# Patient Record
Sex: Female | Born: 1965 | Race: Black or African American | Hispanic: No | Marital: Single | Smoking: Never smoker
Health system: Southern US, Community
[De-identification: ages and names within clinical notes are randomized; demographics above are authoritative.]

## PROBLEM LIST (undated history)

## (undated) DIAGNOSIS — I10 Essential (primary) hypertension: Secondary | ICD-10-CM

## (undated) DIAGNOSIS — E119 Type 2 diabetes mellitus without complications: Secondary | ICD-10-CM

## (undated) DIAGNOSIS — Z973 Presence of spectacles and contact lenses: Secondary | ICD-10-CM

## (undated) DIAGNOSIS — I509 Heart failure, unspecified: Secondary | ICD-10-CM

## (undated) DIAGNOSIS — G7249 Other inflammatory and immune myopathies, not elsewhere classified: Secondary | ICD-10-CM

## (undated) DIAGNOSIS — I272 Pulmonary hypertension, unspecified: Secondary | ICD-10-CM

## (undated) HISTORY — DX: Essential (primary) hypertension: I10

## (undated) HISTORY — DX: Type 2 diabetes mellitus without complications: E11.9

## (undated) HISTORY — DX: Other inflammatory and immune myopathies, not elsewhere classified: G72.49

---

## 1997-04-26 HISTORY — PX: SKIN GRAFT: SHX250

## 1997-10-12 ENCOUNTER — Emergency Department (HOSPITAL_COMMUNITY): Admission: EM | Admit: 1997-10-12 | Discharge: 1997-10-12 | Payer: Self-pay | Admitting: Emergency Medicine

## 1997-10-13 ENCOUNTER — Emergency Department (HOSPITAL_COMMUNITY): Admission: EM | Admit: 1997-10-13 | Discharge: 1997-10-13 | Payer: Self-pay | Admitting: Emergency Medicine

## 1997-10-28 ENCOUNTER — Ambulatory Visit (HOSPITAL_BASED_OUTPATIENT_CLINIC_OR_DEPARTMENT_OTHER): Admission: RE | Admit: 1997-10-28 | Discharge: 1997-10-28 | Payer: Self-pay | Admitting: Plastic Surgery

## 1999-05-28 ENCOUNTER — Encounter: Payer: Self-pay | Admitting: Internal Medicine

## 1999-05-28 ENCOUNTER — Encounter: Admission: RE | Admit: 1999-05-28 | Discharge: 1999-05-28 | Payer: Self-pay | Admitting: Internal Medicine

## 1999-06-11 ENCOUNTER — Other Ambulatory Visit: Admission: RE | Admit: 1999-06-11 | Discharge: 1999-06-11 | Payer: Self-pay | Admitting: Internal Medicine

## 1999-07-16 ENCOUNTER — Encounter: Admission: RE | Admit: 1999-07-16 | Discharge: 1999-10-14 | Payer: Self-pay | Admitting: Internal Medicine

## 2000-02-23 ENCOUNTER — Encounter: Admission: RE | Admit: 2000-02-23 | Discharge: 2000-05-23 | Payer: Self-pay | Admitting: Internal Medicine

## 2000-09-30 ENCOUNTER — Other Ambulatory Visit: Admission: RE | Admit: 2000-09-30 | Discharge: 2000-09-30 | Payer: Self-pay | Admitting: Internal Medicine

## 2000-12-14 ENCOUNTER — Encounter: Admission: RE | Admit: 2000-12-14 | Discharge: 2001-03-14 | Payer: Self-pay | Admitting: Internal Medicine

## 2001-10-09 ENCOUNTER — Other Ambulatory Visit: Admission: RE | Admit: 2001-10-09 | Discharge: 2001-10-09 | Payer: Self-pay | Admitting: Internal Medicine

## 2001-12-31 ENCOUNTER — Emergency Department (HOSPITAL_COMMUNITY): Admission: EM | Admit: 2001-12-31 | Discharge: 2001-12-31 | Payer: Self-pay | Admitting: Emergency Medicine

## 2004-07-08 ENCOUNTER — Emergency Department (HOSPITAL_COMMUNITY): Admission: EM | Admit: 2004-07-08 | Discharge: 2004-07-08 | Payer: Self-pay | Admitting: Emergency Medicine

## 2005-06-28 ENCOUNTER — Encounter: Admission: RE | Admit: 2005-06-28 | Discharge: 2005-06-28 | Payer: Self-pay | Admitting: Internal Medicine

## 2005-07-23 ENCOUNTER — Encounter: Admission: RE | Admit: 2005-07-23 | Discharge: 2005-07-23 | Payer: Self-pay | Admitting: Gastroenterology

## 2006-10-05 ENCOUNTER — Emergency Department (HOSPITAL_COMMUNITY): Admission: EM | Admit: 2006-10-05 | Discharge: 2006-10-05 | Payer: Self-pay | Admitting: Emergency Medicine

## 2010-04-08 ENCOUNTER — Emergency Department (HOSPITAL_COMMUNITY)
Admission: EM | Admit: 2010-04-08 | Discharge: 2010-04-08 | Payer: Self-pay | Source: Home / Self Care | Admitting: Emergency Medicine

## 2010-04-26 HISTORY — PX: MUSCLE BIOPSY: SHX716

## 2010-05-12 ENCOUNTER — Ambulatory Visit (HOSPITAL_COMMUNITY)
Admission: RE | Admit: 2010-05-12 | Discharge: 2010-05-12 | Payer: Self-pay | Source: Home / Self Care | Attending: Neurosurgery | Admitting: Neurosurgery

## 2010-05-13 LAB — BASIC METABOLIC PANEL
BUN: 12 mg/dL (ref 6–23)
CO2: 33 mEq/L — ABNORMAL HIGH (ref 19–32)
Calcium: 9.2 mg/dL (ref 8.4–10.5)
Chloride: 98 mEq/L (ref 96–112)
Creatinine, Ser: 0.58 mg/dL (ref 0.4–1.2)
GFR calc Af Amer: >60 mL/min (ref >60)
GFR calc non Af Amer: >60 mL/min (ref >60)
Glucose, Bld: 87 mg/dL (ref 70–99)
Potassium: 3.4 mEq/L — ABNORMAL LOW (ref 3.5–5.1)
Sodium: 139 mEq/L (ref 135–145)

## 2010-05-13 LAB — SURGICAL PCR SCREEN
MRSA, PCR: NEGATIVE
Staphylococcus aureus: NEGATIVE

## 2010-05-13 LAB — CBC
HCT: 33.6 % — ABNORMAL LOW (ref 36.0–46.0)
Hemoglobin: 11.4 g/dL — ABNORMAL LOW (ref 12.0–15.0)
MCH: 27.2 pg (ref 26.0–34.0)
MCHC: 33.9 g/dL (ref 30.0–36.0)
MCV: 80.2 fL (ref 78.0–100.0)
Platelets: 432 10*3/uL — ABNORMAL HIGH (ref 150–400)
RBC: 4.19 MIL/uL (ref 3.87–5.11)
RDW: 14.6 % (ref 11.5–15.5)
WBC: 5.2 10*3/uL (ref 4.0–10.5)

## 2010-05-13 LAB — GLUCOSE, CAPILLARY: Glucose-Capillary: 78 mg/dL (ref 70–99)

## 2010-05-18 NOTE — Consult Note (Signed)
  Shannon [name], Shannon [name]              ACCOUNT NO.:  [id number]  MEDICAL RECORD NO.:  [id number]          PATIENT TYPE:  AMB  LOCATION:  SDS                          FACILITY:  [facility]  PHYSICIAN:  Leeroy Cha, M.D.   DATE OF BIRTH:  [birth year]  DATE OF CONSULTATION: DATE OF DISCHARGE:                                CONSULTATION   Shannon [name] is a lady who was seen in my office last week.  She has diffuse weakness.  She was sent to [place] for a biopsy of the left biceps. The patient was advised to have surgery today.  Nevertheless, during surgery had a difficult time trying to get hold of her and today she showed up.  She was late and we have no preop.  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  She is going to be cancelled and to schedule some time at her convenience.          ______________________________ Leeroy Cha, M.D.     EB/MEDQ  D:  05/12/2010  T:  05/12/2010  Job:  [id number]  Electronically Signed by Leeroy Cha M.D. on 05/18/2010 05:22:16 PM

## 2010-05-19 ENCOUNTER — Ambulatory Visit (HOSPITAL_COMMUNITY)
Admission: RE | Admit: 2010-05-19 | Discharge: 2010-05-19 | Payer: Self-pay | Source: Home / Self Care | Attending: Neurosurgery | Admitting: Neurosurgery

## 2010-05-20 LAB — GLUCOSE, CAPILLARY
Glucose-Capillary: 60 mg/dL — ABNORMAL LOW (ref 70–99)
Glucose-Capillary: 103 mg/dL — ABNORMAL HIGH (ref 70–99)
Glucose-Capillary: 67 mg/dL — ABNORMAL LOW (ref 70–99)

## 2010-05-20 LAB — POCT I-STAT 4, (NA,K, GLUC, HGB,HCT)
Glucose, Bld: 78 mg/dL (ref 70–99)
HCT: 34 % — ABNORMAL LOW (ref 36.0–46.0)
Hemoglobin: 11.6 g/dL — ABNORMAL LOW (ref 12.0–15.0)
Potassium: 3 mEq/L — ABNORMAL LOW (ref 3.5–5.1)
Sodium: 140 mEq/L (ref 135–145)

## 2010-06-03 NOTE — Op Note (Signed)
  Shannon [name], Shannon [name]              ACCOUNT NO.:  [id number]  MEDICAL RECORD NO.:  [id number]          PATIENT TYPE:  AMB  LOCATION:  SDS                          FACILITY:  [facility]  PHYSICIAN:  Leeroy Cha, M.D.   DATE OF BIRTH:  1966/03/03  DATE OF PROCEDURE:  05/19/2010 DATE OF DISCHARGE:  05/19/2010                              OPERATIVE REPORT   ADMISSION DIAGNOSES:  Diffuse weakness.  Myopathy.  POSTOPERATIVE DIAGNOSES:  Diffuse weakness.  Myopathy.  PROCEDURE:  Biopsy of the left biceps.  CLINICAL HISTORY:  [name] is a patient who was sent to [place] for a biopsy of the left biceps.  She had been seen by [facility]. She has had difficulty walking, difficulty standing, all these started several months ago.  She had completely worked up.  Diagnosis has been made.  Biopsy was requested, will be sent to [facility] in [place], [place].  The patient knows the risks of infection, hematoma, and need for further surgery .  PROCEDURE IN DETAILS:  [name] was taken to the OR and after intubation, the left arm was prepped with Betadine.  Drapes were applied.  Longitudinal incision was made through the skin to thick adipose tissue.  We found the biceps.  The fascia was opened and 2 pieces of biopsies were approximately 2 cm in length were removed.  Then immediately was wrapped in saline solution and the patient was sent immediately to the laboratory.  They were made aware by [place] that the biopsies need to be sent to [facility].  Then the area was irrigated. Hemostasis was done with bipolar.  The fascia was closed with 2-0 Vicryl, subcutaneous space with 3-0, and the skin with Steri-Strips. The patient is going to go to the recovery room and discharged to home.          ______________________________ Leeroy Cha, M.D.     EB/MEDQ  D:  05/19/2010  T:  05/20/2010  Job:  [id number]  Electronically Signed by Leeroy Cha M.D. on 06/03/2010 09:36:13 AM

## 2010-07-07 LAB — BASIC METABOLIC PANEL
BUN: 15 mg/dL (ref 6–23)
CO2: 30 mEq/L (ref 19–32)
Calcium: 8.9 mg/dL (ref 8.4–10.5)
Chloride: 96 mEq/L (ref 96–112)
Creatinine, Ser: 0.63 mg/dL (ref 0.4–1.2)
GFR calc Af Amer: >60 mL/min (ref >60)
GFR calc non Af Amer: >60 mL/min (ref >60)
Glucose, Bld: 70 mg/dL (ref 70–99)
Potassium: 2.8 mEq/L — ABNORMAL LOW (ref 3.5–5.1)
Sodium: 137 mEq/L (ref 135–145)

## 2010-07-07 LAB — CK TOTAL AND CKMB (NOT AT ARMC)
CK, MB: 429.4 ng/mL (ref 0.3–4.0)
Relative Index: 13 — ABNORMAL HIGH (ref 0.0–2.5)
Total CK: 3293 U/L — ABNORMAL HIGH (ref 7–177)

## 2010-07-07 LAB — CBC
HCT: 30.9 % — ABNORMAL LOW (ref 36.0–46.0)
Hemoglobin: 10.7 g/dL — ABNORMAL LOW (ref 12.0–15.0)
MCH: 27.7 pg (ref 26.0–34.0)
MCHC: 34.6 g/dL (ref 30.0–36.0)
MCV: 80.1 fL (ref 78.0–100.0)
Platelets: 456 10*3/uL — ABNORMAL HIGH (ref 150–400)
RBC: 3.86 MIL/uL — ABNORMAL LOW (ref 3.87–5.11)
RDW: 14.7 % (ref 11.5–15.5)
WBC: 7.3 10*3/uL (ref 4.0–10.5)

## 2010-07-07 LAB — URINALYSIS, ROUTINE W REFLEX MICROSCOPIC
Glucose, UA: NEGATIVE mg/dL
Ketones, ur: 15 mg/dL — AB
Nitrite: NEGATIVE
Protein, ur: 100 mg/dL — AB
Specific Gravity, Urine: 1.024 (ref 1.005–1.030)
Urobilinogen, UA: 1 mg/dL (ref 0.0–1.0)
pH: 5.5 (ref 5.0–8.0)

## 2010-07-07 LAB — POCT PREGNANCY, URINE: Preg Test, Ur: NEGATIVE

## 2010-07-07 LAB — DIFFERENTIAL
Basophils Absolute: 0 10*3/uL (ref 0.0–0.1)
Basophils Relative: 0 % (ref 0–1)
Eosinophils Absolute: 0.1 10*3/uL (ref 0.0–0.7)
Eosinophils Relative: 1 % (ref 0–5)
Lymphocytes Relative: 42 % (ref 12–46)
Lymphs Abs: 3.1 10*3/uL (ref 0.7–4.0)
Monocytes Absolute: 0.7 10*3/uL (ref 0.1–1.0)
Monocytes Relative: 10 % (ref 3–12)
Neutro Abs: 3.4 10*3/uL (ref 1.7–7.7)
Neutrophils Relative %: 47 % (ref 43–77)

## 2010-07-07 LAB — URINE MICROSCOPIC-ADD ON

## 2010-07-17 ENCOUNTER — Ambulatory Visit: Payer: 59 | Attending: Diagnostic Neuroimaging | Admitting: Physical Therapy

## 2010-07-17 DIAGNOSIS — R269 Unspecified abnormalities of gait and mobility: Secondary | ICD-10-CM | POA: Insufficient documentation

## 2010-07-17 DIAGNOSIS — IMO0001 Reserved for inherently not codable concepts without codable children: Secondary | ICD-10-CM | POA: Insufficient documentation

## 2010-07-20 ENCOUNTER — Ambulatory Visit: Payer: 59 | Admitting: Physical Therapy

## 2010-07-23 ENCOUNTER — Ambulatory Visit: Payer: 59 | Attending: Diagnostic Neuroimaging | Admitting: Physical Therapy

## 2010-07-23 DIAGNOSIS — R269 Unspecified abnormalities of gait and mobility: Secondary | ICD-10-CM | POA: Insufficient documentation

## 2010-07-23 DIAGNOSIS — IMO0001 Reserved for inherently not codable concepts without codable children: Secondary | ICD-10-CM | POA: Insufficient documentation

## 2010-07-27 ENCOUNTER — Ambulatory Visit: Payer: 59 | Attending: Diagnostic Neuroimaging | Admitting: Physical Therapy

## 2010-07-27 DIAGNOSIS — R269 Unspecified abnormalities of gait and mobility: Secondary | ICD-10-CM | POA: Insufficient documentation

## 2010-07-27 DIAGNOSIS — IMO0001 Reserved for inherently not codable concepts without codable children: Secondary | ICD-10-CM | POA: Insufficient documentation

## 2010-07-30 ENCOUNTER — Ambulatory Visit: Payer: 59

## 2010-07-30 ENCOUNTER — Ambulatory Visit: Payer: 59 | Admitting: Physical Therapy

## 2010-08-04 ENCOUNTER — Ambulatory Visit: Payer: 59 | Admitting: Physical Therapy

## 2010-08-06 ENCOUNTER — Ambulatory Visit: Payer: 59 | Admitting: Physical Therapy

## 2010-08-10 ENCOUNTER — Ambulatory Visit: Payer: 59 | Admitting: Physical Therapy

## 2010-08-13 ENCOUNTER — Ambulatory Visit: Payer: 59 | Admitting: Physical Therapy

## 2010-08-26 ENCOUNTER — Ambulatory Visit: Payer: 59 | Admitting: Physical Therapy

## 2010-08-27 ENCOUNTER — Ambulatory Visit: Payer: 59 | Admitting: Physical Therapy

## 2010-09-01 ENCOUNTER — Ambulatory Visit: Payer: 59 | Admitting: Physical Therapy

## 2010-09-03 ENCOUNTER — Ambulatory Visit: Payer: 59 | Admitting: Physical Therapy

## 2010-09-08 ENCOUNTER — Ambulatory Visit: Payer: 59 | Admitting: Physical Therapy

## 2010-09-10 ENCOUNTER — Ambulatory Visit: Payer: 59 | Admitting: Physical Therapy

## 2010-09-13 ENCOUNTER — Emergency Department (HOSPITAL_COMMUNITY)
Admission: EM | Admit: 2010-09-13 | Discharge: 2010-09-13 | Disposition: A | Discharge status: Home / Self Care | Payer: 59 | Attending: Emergency Medicine | Admitting: Emergency Medicine

## 2010-09-13 ENCOUNTER — Emergency Department (HOSPITAL_COMMUNITY): Payer: 59

## 2010-09-13 ENCOUNTER — Inpatient Hospital Stay (INDEPENDENT_AMBULATORY_CARE_PROVIDER_SITE_OTHER)
Admission: RE | Admit: 2010-09-13 | Discharge: 2010-09-13 | Disposition: A | Discharge status: Home / Self Care | Payer: 59 | Source: Ambulatory Visit | Attending: Family Medicine | Admitting: Family Medicine

## 2010-09-13 DIAGNOSIS — Z794 Long term (current) use of insulin: Secondary | ICD-10-CM | POA: Insufficient documentation

## 2010-09-13 DIAGNOSIS — E119 Type 2 diabetes mellitus without complications: Secondary | ICD-10-CM | POA: Insufficient documentation

## 2010-09-13 DIAGNOSIS — D649 Anemia, unspecified: Secondary | ICD-10-CM

## 2010-09-13 DIAGNOSIS — Z79899 Other long term (current) drug therapy: Secondary | ICD-10-CM | POA: Insufficient documentation

## 2010-09-13 DIAGNOSIS — M79609 Pain in unspecified limb: Secondary | ICD-10-CM | POA: Insufficient documentation

## 2010-09-13 DIAGNOSIS — L03019 Cellulitis of unspecified finger: Secondary | ICD-10-CM | POA: Insufficient documentation

## 2010-09-13 DIAGNOSIS — M7989 Other specified soft tissue disorders: Secondary | ICD-10-CM | POA: Insufficient documentation

## 2010-09-13 DIAGNOSIS — R609 Edema, unspecified: Secondary | ICD-10-CM

## 2010-09-13 DIAGNOSIS — I10 Essential (primary) hypertension: Secondary | ICD-10-CM | POA: Insufficient documentation

## 2010-09-13 LAB — POCT I-STAT, CHEM 8
BUN: 26 mg/dL — ABNORMAL HIGH (ref 6–23)
Calcium, Ion: 1.13 mmol/L (ref 1.12–1.32)
Chloride: 97 mEq/L (ref 96–112)
Creatinine, Ser: 0.7 mg/dL (ref 0.4–1.2)
Glucose, Bld: 228 mg/dL — ABNORMAL HIGH (ref 70–99)
HCT: 30 % — ABNORMAL LOW (ref 36.0–46.0)
Hemoglobin: 10.2 g/dL — ABNORMAL LOW (ref 12.0–15.0)
Potassium: 3.9 mEq/L (ref 3.5–5.1)
Sodium: 136 mEq/L (ref 135–145)
TCO2: 33 mmol/L (ref 0–100)

## 2010-09-13 LAB — POCT URINALYSIS DIP (DEVICE)
Bilirubin Urine: NEGATIVE
Glucose, UA: 500 mg/dL — AB
Hgb urine dipstick: NEGATIVE
Ketones, ur: NEGATIVE mg/dL
Nitrite: NEGATIVE
Protein, ur: NEGATIVE mg/dL
Specific Gravity, Urine: 1.015 (ref 1.005–1.030)
Urobilinogen, UA: 0.2 mg/dL (ref 0.0–1.0)
pH: 6.5 (ref 5.0–8.0)

## 2010-09-15 ENCOUNTER — Ambulatory Visit: Payer: 59 | Admitting: Physical Therapy

## 2010-09-17 ENCOUNTER — Ambulatory Visit: Payer: 59 | Admitting: Physical Therapy

## 2010-12-24 ENCOUNTER — Inpatient Hospital Stay (INDEPENDENT_AMBULATORY_CARE_PROVIDER_SITE_OTHER)
Admission: RE | Admit: 2010-12-24 | Discharge: 2010-12-24 | Disposition: A | Discharge status: Home / Self Care | Payer: 59 | Source: Ambulatory Visit | Attending: Emergency Medicine | Admitting: Emergency Medicine

## 2010-12-24 DIAGNOSIS — L0231 Cutaneous abscess of buttock: Secondary | ICD-10-CM

## 2010-12-26 ENCOUNTER — Inpatient Hospital Stay (HOSPITAL_COMMUNITY)
Admission: RE | Admit: 2010-12-26 | Discharge: 2010-12-26 | Disposition: A | Discharge status: Home / Self Care | Payer: 59 | Source: Ambulatory Visit | Attending: Emergency Medicine | Admitting: Emergency Medicine

## 2010-12-27 LAB — CULTURE, ROUTINE-ABSCESS

## 2011-03-24 ENCOUNTER — Other Ambulatory Visit: Payer: Self-pay | Admitting: Dermatology

## 2013-01-03 ENCOUNTER — Encounter: Payer: Self-pay | Admitting: Diagnostic Neuroimaging

## 2013-01-03 ENCOUNTER — Ambulatory Visit (INDEPENDENT_AMBULATORY_CARE_PROVIDER_SITE_OTHER): Payer: 59 | Admitting: Diagnostic Neuroimaging

## 2013-01-03 VITALS — BP 173/86 | HR 81 | Temp 98.8°F | Ht 60.0 in | Wt 200.0 lb

## 2013-01-03 DIAGNOSIS — G7249 Other inflammatory and immune myopathies, not elsewhere classified: Secondary | ICD-10-CM | POA: Insufficient documentation

## 2013-01-03 NOTE — Progress Notes (Signed)
[facility]  PATIENT: Shannon [name] DOB: [birth year]  REFERRING CLINICIAN:  HISTORY FROM: patient REASON FOR VISIT: follow up   HISTORICAL  CHIEF COMPLAINT:  No chief complaint on file.   HISTORY OF PRESENT ILLNESS:   UPDATE 01/03/13: Since last visit, stable. Reports some left arm and left leg weakness that she feels is stable. I did not note significant left leg weakness at last visit.  UPDATE 06/20/12: Since last visit, sxs are stable. Left arm weakness stable. Mild fatigue. Overall, no worsening since last visit. No falls. Doing well overall.  UPDATE 11/22/11: Doing well. Finally tapered off prednisone, has been off x 6 weeks. LUE weakness is stable. No swallow diff. No leg weakness.  UPDATE 07/07/11: Currently on prednisone 20m daily. 6 weeks ago, when reducing from 30 to 241mper day, started to have mild increased weakness of arms (L > R). No SOB, chest pain or leg weakness. No swallowing diff.  UPDATE 03/03/11: Strength is stable to improving since last visit.  Tolerating prednisone.  Still having weight gain and moon facies.  UPDATE 10/16/10:  The patient states, that her strength has increased further, almost back to her normal baseline. She has been released from PT, and is doing exercises at home. 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 She is still on prednisone 8022m day, and reports, that she sometimes has insomnia, and that she has gained 11 lbs, since the last visit in March 2012.  Now is on insulin for her DM and her BS is usually in the hundreds, compared to the 300s, like it was before.  UPDATE 07/20/10: Shannon [name], that she feels a lot better. She reports, that she has more energy, better strenght and much less difficulty swallowing. She also reports, that her speaking is almost normal again.She is on the 16m62m prednisone a day, and she states, that she  had developed a thrush, for which she is taking Nystatin. She denies any weight gain, being aggressive, insomnia or difficulties in controlling her DM or HTN.  UPDATE 06/08/10: Biopsy confirmed inflamm myopathy; started on prednisone and speech/swalllowing ahve improved a little.  Strength in arms/legs about the same.  Has fallen x 2.  PRIOR HPI (03/18/10): 44 y64r old right-handed female, with hypertension, diabetes, presenting for evaluation of progressive weakness, dysphasia and dysarthria. She is here with her daughter.  The patient reports 2 month history of progressive weakness in her arms and legs. Over the past 2 weeks she has had a change in her speech as well as difficulty with swallowing solid foods. Patient had a similar event of swallowing trouble 4 years ago which spontaneously resolved over a four-month period.  Patient denies any breathing difficulty. She feels that her weakness is slightly improved over the past few weeks but her speech and swallowing have worsened. She denies double vision. She does have intermittent blurred vision lasting for a few seconds.  REVIEW OF SYSTEMS: Full 14 system review of systems performed and notable only for weakness only.  ALLERGIES: No Known Allergies  HOME MEDICATIONS: Prior to Admission medications   Medication Sig Start Date End Date Taking? Authorizing Provider  metFORMIN (GLUCOPHAGE) 1000 MG tablet  10/17/12  Yes Historical Provider, MD  valsartan-hydrochlorothiazide (DIOVAN-HCT) 160-25 MG per tablet  11/16/12  Yes Historical Provider, MD   No outpatient prescriptions prior to visit.   No facility-administered medications prior to visit.  PAST MEDICAL HISTORY: Past Medical History  Diagnosis Date  . Hypertension   . Diabetes   . Inflammatory myopathy     PAST SURGICAL HISTORY: Past Surgical History  Procedure Laterality Date  . Skin graft  1999    FAMILY HISTORY: Family History  Problem Relation Age of Onset  . Heart  disease    . Hypertension    . Diabetes    . Hypertension Mother   . Diabetes Mother   . Hypertension Father     SOCIAL HISTORY:  History   Social History  . Marital Status: Single    Spouse Name: N/A    Number of Children: 1  . Years of Education: College   Occupational History  .      [employer]   Social History Main Topics  . Smoking status: Never Smoker   . Smokeless tobacco: Never Used  . Alcohol Use: No  . Drug Use: No  . Sexual Activity: Not on file   Other Topics Concern  . Not on file   Social History Narrative   Patient lives at home with her daughter.   Caffeine Use:      PHYSICAL EXAM  Filed Vitals:   01/03/13 1413  BP: 173/86  Pulse: 81  Temp: 98.8 F (37.1 C)  TempSrc: Oral  Height: 5' (1.524 m)  Weight: 200 lb (90.719 kg)    Not recorded    Body mass index is 39.06 kg/(m^2).  GENERAL EXAM: General: Patient is well developed and well groomed. Patient is awake and alert and in no acute distress.  MOON FACIES. Cardiovascular: Regular rate and rhythm with no murmurs. No carotid bruit.  Skin: No rash, no bruising  Neurologic Exam  Mental Status: Awake, alert. Language is fluent and comprehensive. Cranial Nerves: Pupils are equal and round and reactive to light. Conjugate eye movements are full and symmetric. Visual fields are full to confrontation. Facial sensation and facial muscle strength are symmetric and in normal limits. Hearing is intact. Palate elevated symmetrically and uvula is midline. Shoulder shrug is symmetric. Tongue is midline. Motor: Symmetric normal motor tone is noted throughout. Normal muscle bulk. Testing reveals 4/5 IN DELTOIDS AND LEFT TRICEPS and LEFT HIP FLEXION. 5/5 ELSEWHERE. Sensory: Intact and symmetric to light touch. Coordination: No tremor or dysmetria noted. Gait and Station: Narrow based gait with normal arm swing bilateral, able to walk on heels and toes.  Tandem walk is stable. MILD HIP TILT WITH WALKING. ABLE  TO STAND WITH ARMS FOLDED.  Reflexes: DTR in the upper and lower extremity are present and symmetric 2+.    DIAGNOSTIC DATA (LABS, IMAGING, TESTING) - I reviewed patient records, labs, notes, testing and imaging myself where available.  Lab Results  Component Value Date   WBC 5.2 05/12/2010   HGB 10.2* 09/13/2010   HCT 30.0* 09/13/2010   MCV 80.2 05/12/2010   PLT 432* 05/12/2010      Component Value Date/Time   NA 136 09/13/2010 1930   K 3.9 09/13/2010 1930   CL 97 09/13/2010 1930   CO2 33* 05/12/2010 0854   GLUCOSE 228* 09/13/2010 1930   BUN 26* 09/13/2010 1930   CREATININE 0.70 09/13/2010 1930   CALCIUM 9.2 05/12/2010 0854   GFRNONAA >60 05/12/2010 0854   GFRAA  Value: >60        The eGFR has been calculated using the MDRD equation. This calculation has not been validated in all clinical situations. eGFR's persistently <60 mL/min signify possible  Chronic Kidney Disease. 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  Great inital response to prednisone, and now finally off prednisone since May 2013. Some residual left arm weakness, which has been stable since July 2013.  Plan: 1. stay off prednisone for now 2. check TTE to screen for cardiac abnl  Orders Placed This Encounter  Procedures  . 2D Echocardiogram with contrast    Return in about 6 months (around 07/03/2013) for with Charlott Holler or Penumalli.    Penni Bombard, MD 08/10/3843, 3:64 PM Certified in Neurology, Neurophysiology and Neuroimaging  [facility] [address] [place], [place] [address] [phone]

## 2013-01-04 DIAGNOSIS — Z0289 Encounter for other administrative examinations: Secondary | ICD-10-CM

## 2013-01-19 ENCOUNTER — Telehealth: Payer: Self-pay | Admitting: Diagnostic Neuroimaging

## 2013-01-19 DIAGNOSIS — G7249 Other inflammatory and immune myopathies, not elsewhere classified: Secondary | ICD-10-CM

## 2013-01-19 NOTE — Telephone Encounter (Signed)
I called patient and let her know her forms will be done on Monday or Tuesday. I am not seeing a referral. I will look further into that and we can review that as well when I have completed her forms,as I will call her when that is done.   I do see that a 2-D Echo referral was to be made. I will follow up on that.

## 2013-01-24 ENCOUNTER — Telehealth: Payer: Self-pay | Admitting: Diagnostic Neuroimaging

## 2013-01-24 NOTE — Telephone Encounter (Signed)
I called patient back and let her know that should be back and submitted tomorrow.

## 2013-02-05 ENCOUNTER — Other Ambulatory Visit (HOSPITAL_COMMUNITY): Payer: 59

## 2013-02-19 ENCOUNTER — Ambulatory Visit (HOSPITAL_COMMUNITY): Payer: 59 | Attending: Diagnostic Neuroimaging | Admitting: Cardiology

## 2013-02-19 DIAGNOSIS — E119 Type 2 diabetes mellitus without complications: Secondary | ICD-10-CM | POA: Insufficient documentation

## 2013-02-19 DIAGNOSIS — G7249 Other inflammatory and immune myopathies, not elsewhere classified: Secondary | ICD-10-CM

## 2013-02-19 DIAGNOSIS — I10 Essential (primary) hypertension: Secondary | ICD-10-CM | POA: Insufficient documentation

## 2013-02-19 NOTE — Progress Notes (Signed)
Echo performed. 

## 2013-06-28 ENCOUNTER — Emergency Department (HOSPITAL_COMMUNITY): Payer: 59

## 2013-06-28 ENCOUNTER — Encounter (HOSPITAL_COMMUNITY): Payer: Self-pay | Admitting: Emergency Medicine

## 2013-06-28 ENCOUNTER — Emergency Department (HOSPITAL_COMMUNITY)
Admission: EM | Admit: 2013-06-28 | Discharge: 2013-06-28 | Disposition: A | Discharge status: Home / Self Care | Payer: 59 | Attending: Emergency Medicine | Admitting: Emergency Medicine

## 2013-06-28 DIAGNOSIS — E119 Type 2 diabetes mellitus without complications: Secondary | ICD-10-CM | POA: Diagnosis not present

## 2013-06-28 DIAGNOSIS — M549 Dorsalgia, unspecified: Secondary | ICD-10-CM

## 2013-06-28 DIAGNOSIS — Z79899 Other long term (current) drug therapy: Secondary | ICD-10-CM | POA: Diagnosis not present

## 2013-06-28 DIAGNOSIS — Y9389 Activity, other specified: Secondary | ICD-10-CM | POA: Insufficient documentation

## 2013-06-28 DIAGNOSIS — IMO0002 Reserved for concepts with insufficient information to code with codable children: Secondary | ICD-10-CM | POA: Diagnosis present

## 2013-06-28 DIAGNOSIS — I10 Essential (primary) hypertension: Secondary | ICD-10-CM | POA: Diagnosis not present

## 2013-06-28 DIAGNOSIS — Z8669 Personal history of other diseases of the nervous system and sense organs: Secondary | ICD-10-CM | POA: Insufficient documentation

## 2013-06-28 DIAGNOSIS — Y92410 Unspecified street and highway as the place of occurrence of the external cause: Secondary | ICD-10-CM | POA: Diagnosis not present

## 2013-06-28 MED ORDER — METHOCARBAMOL 500 MG PO TABS: 500.0000 mg | ORAL_TABLET | Freq: Four times a day (QID) | ORAL | Status: DC

## 2013-06-28 MED ORDER — DIAZEPAM 5 MG PO TABS
5.0000 mg | ORAL_TABLET | Freq: Once | ORAL | Status: AC
  Administered 2013-06-28: 5 mg via ORAL
  Filled 2013-06-28: qty 1

## 2013-06-28 MED ORDER — HYDROCODONE-ACETAMINOPHEN 5-325 MG PO TABS
2.0000 | ORAL_TABLET | ORAL | Status: DC | PRN
Start: 2013-06-28 — End: 2013-11-15

## 2013-06-28 MED ORDER — KETOROLAC TROMETHAMINE 60 MG/2ML IM SOLN
60.0000 mg | Freq: Once | INTRAMUSCULAR | Status: AC
  Administered 2013-06-28: 60 mg via INTRAMUSCULAR
  Filled 2013-06-28: qty 2

## 2013-06-28 NOTE — Discharge Instructions (Signed)
Take Vicodin as needed for pain. Take Robaxin as needed for muscle spasm. Refer to attached documents for more information. Return to the ED with worsening or concerning symptoms.
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
## 2013-06-29 NOTE — ED Provider Notes (Signed)
Medical screening examination/treatment/procedure(s) were performed by non-physician practitioner and as supervising physician I was immediately available for consultation/collaboration.   EKG Interpretation None        Delice Bison Ward, DO 06/29/13 0106

## 2013-07-03 ENCOUNTER — Ambulatory Visit (INDEPENDENT_AMBULATORY_CARE_PROVIDER_SITE_OTHER): Payer: 59 | Admitting: Diagnostic Neuroimaging

## 2013-07-03 ENCOUNTER — Encounter: Payer: Self-pay | Admitting: Diagnostic Neuroimaging

## 2013-07-03 VITALS — BP 147/89 | HR 70 | Ht 60.0 in

## 2013-07-03 DIAGNOSIS — G7249 Other inflammatory and immune myopathies, not elsewhere classified: Secondary | ICD-10-CM

## 2013-07-03 NOTE — Patient Instructions (Signed)
Continue current medications. 

## 2013-07-03 NOTE — Progress Notes (Signed)
[facility]  PATIENT: Shannon [name] DOB: 1966/04/09  REFERRING CLINICIAN:  HISTORY FROM: patient REASON FOR VISIT: follow up   HISTORICAL  CHIEF COMPLAINT:  Chief Complaint  Patient presents with  . Follow-up    HISTORY OF PRESENT ILLNESS:   UPDATE 07/03/12: Since last visit, stable again. Left arm/leg weakness similar. No dysphagia or dysarthria. TTE results reviewed.  UPDATE 01/03/13: Since last visit, stable. Reports some left arm and left leg weakness that she feels is stable. I did not note significant left leg weakness at last visit.  UPDATE 06/20/12: Since last visit, sxs are stable. Left arm weakness stable. Mild fatigue. Overall, no worsening since last visit. No falls. Doing well overall.  UPDATE 11/22/11: Doing well. Finally tapered off prednisone, has been off x 6 weeks. LUE weakness is stable. No swallow diff. No leg weakness.  UPDATE 07/07/11: Currently on prednisone 43m daily. 6 weeks ago, when reducing from 30 to 228mper day, started to have mild increased weakness of arms (L > R). No SOB, chest pain or leg weakness. No swallowing diff.  UPDATE 03/03/11: Strength is stable to improving since last visit.  Tolerating prednisone.  Still having weight gain and moon facies.  UPDATE 10/16/10:  The patient states, that her strength has increased further, almost back to her normal baseline. She has been released from PT, and is doing exercises at home. 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 She is still on prednisone 8062m day, and reports, that she sometimes has insomnia, and that she has gained 11 lbs, since the last visit in March 2012.  Now is on insulin for her DM and her BS is usually in the hundreds, compared to the 300s, like it was before.  UPDATE 07/20/10: Shannon [name], that she feels a lot better. She reports, that she has more energy, better strenght  and much less difficulty swallowing. She also reports, that her speaking is almost normal again.She is on the 70m70m prednisone a day, and she states, that she had developed a thrush, for which she is taking Nystatin. She denies any weight gain, being aggressive, insomnia or difficulties in controlling her DM or HTN.  UPDATE 06/08/10: Biopsy confirmed inflamm myopathy; started on prednisone and speech/swalllowing ahve improved a little.  Strength in arms/legs about the same.  Has fallen x 2.  PRIOR HPI (03/18/10): 44 y15r old right-handed female, with hypertension, diabetes, presenting for evaluation of progressive weakness, dysphasia and dysarthria. She is here with her daughter.  The patient reports 2 month history of progressive weakness in her arms and legs. Over the past 2 weeks she has had a change in her speech as well as difficulty with swallowing solid foods. Patient had a similar event of swallowing trouble 4 years ago which spontaneously resolved over a four-month period.  Patient denies any breathing difficulty. She feels that her weakness is slightly improved over the past few weeks but her speech and swallowing have worsened. She denies double vision. She does have intermittent blurred vision lasting for a few seconds.  REVIEW OF SYSTEMS: Full 14 system review of systems performed and notable only for weakness only.  ALLERGIES: No Known Allergies  HOME MEDICATIONS: Outpatient Prescriptions Prior to Visit  Medication Sig Dispense Refill  . metFORMIN (GLUCOPHAGE) 1000 MG tablet Take 1,000 mg by mouth daily.       . valsartan-hydrochlorothiazide (DIOVAN-HCT) 160-25 MG per  tablet Take 1 tablet by mouth daily.      Marland Kitchen HYDROcodone-acetaminophen (NORCO/VICODIN) 5-325 MG per tablet Take 2 tablets by mouth every 4 (four) hours as needed.  12 tablet  0  . methocarbamol (ROBAXIN) 500 MG tablet Take 1 tablet (500 mg total) by mouth 4 (four) times daily.  20 tablet  0   No facility-administered  medications prior to visit.    PAST MEDICAL HISTORY: Past Medical History  Diagnosis Date  . Hypertension   . Diabetes   . Inflammatory myopathy     PAST SURGICAL HISTORY: Past Surgical History  Procedure Laterality Date  . Skin graft  1999    FAMILY HISTORY: Family History  Problem Relation Age of Onset  . Heart disease    . Hypertension    . Diabetes    . Hypertension Mother   . Diabetes Mother   . Hypertension Father     SOCIAL HISTORY:  History   Social History  . Marital Status: Single    Spouse Name: N/A    Number of Children: 1  . Years of Education: College   Occupational History  .      [employer]   Social History Main Topics  . Smoking status: Never Smoker   . Smokeless tobacco: Never Used  . Alcohol Use: No  . Drug Use: No  . Sexual Activity: Not on file   Other Topics Concern  . Not on file   Social History Narrative   Patient lives at home with her daughter.   Caffeine Use:      PHYSICAL EXAM  Filed Vitals:   07/03/13 1437  BP: 147/89  Pulse: 70  Height: 5' (1.524 m)    Not recorded    There is no weight on file to calculate BMI.  GENERAL EXAM: General: Patient is well developed and well groomed. Patient is awake and alert and in no acute distress.  MOON FACIES. Cardiovascular: Regular rate and rhythm with no murmurs. No carotid bruit.  Skin: No rash, no bruising  Neurologic Exam  Mental Status: Awake, alert. Language is fluent and comprehensive. Cranial Nerves: Pupils are equal and round and reactive to light. Conjugate eye movements are full and symmetric. Visual fields are full to confrontation. Facial sensation and facial muscle strength are symmetric and in normal limits. Hearing is intact. Palate elevated symmetrically and uvula is midline. Shoulder shrug is symmetric. Tongue is midline. Motor: Symmetric normal motor tone is noted throughout. Normal muscle bulk. Testing reveals LEFT DELTOID 3/5, LEFT TRICEPS 4, LEFT HF 3,  LEFT KNEE EXT 4. 5/5 ELSEWHERE. Sensory: Intact and symmetric to light touch. Coordination: No tremor or dysmetria noted. Gait and Station: Narrow based gait with normal arm swing bilateral, able to walk on heels and toes.  Tandem walk is stable. MILD HIP TILT WITH WALKING. Reflexes: DTR in the upper and lower extremity are present and symmetric 2+.    DIAGNOSTIC DATA (LABS, IMAGING, TESTING) - I reviewed patient records, labs, notes, testing and imaging myself where available.  Lab Results  Component Value Date   WBC 5.2 05/12/2010   HGB 10.2* 09/13/2010   HCT 30.0* 09/13/2010   MCV 80.2 05/12/2010   PLT 432* 05/12/2010      Component Value Date/Time   NA 136 09/13/2010 1930   K 3.9 09/13/2010 1930   CL 97 09/13/2010 1930   CO2 33* 05/12/2010 0854   GLUCOSE 228* 09/13/2010 1930   BUN 26* 09/13/2010 1930  CREATININE 0.70 09/13/2010 1930   CALCIUM 9.2 05/12/2010 0854   GFRNONAA >60 05/12/2010 0854   GFRAA  Value: >60        The eGFR has been calculated using the MDRD equation. This calculation has not been validated in all clinical situations. eGFR's persistently <60 mL/min signify possible Chronic Kidney Disease. 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  Great inital response to prednisone, and now finally off prednisone since May 2013. Some residual left arm/leg weakness, which has been stable since July 2013.  PLAN: 1. stay off prednisone for now; monitor symptoms  Return in about 6 months (around 01/03/2014).    Penni Bombard, MD 4/56/2563, 8:93 PM Certified in Neurology, Neurophysiology and Neuroimaging  [facility] [address] [place],   [address] [phone]

## 2013-07-04 DIAGNOSIS — Z0289 Encounter for other administrative examinations: Secondary | ICD-10-CM

## 2013-07-18 ENCOUNTER — Telehealth: Payer: Self-pay | Admitting: Diagnostic Neuroimaging

## 2013-07-18 NOTE — Telephone Encounter (Signed)
Patient is checking status of paperwork to be sent to employer--please call

## 2013-07-18 NOTE — Telephone Encounter (Signed)
Called pt and left message informing her that her paperwork was still in the process of being completed and if she has any other problems, questions or concerns to call the office.

## 2013-11-15 ENCOUNTER — Other Ambulatory Visit: Payer: Self-pay

## 2013-11-15 ENCOUNTER — Other Ambulatory Visit: Payer: Self-pay | Admitting: Orthopedic Surgery

## 2013-11-15 ENCOUNTER — Encounter (HOSPITAL_BASED_OUTPATIENT_CLINIC_OR_DEPARTMENT_OTHER): Payer: Self-pay | Admitting: *Deleted

## 2013-11-15 ENCOUNTER — Ambulatory Visit (HOSPITAL_BASED_OUTPATIENT_CLINIC_OR_DEPARTMENT_OTHER)
Admission: RE | Admit: 2013-11-15 | Discharge: 2013-11-15 | Disposition: A | Discharge status: Home / Self Care | Payer: 59 | Source: Ambulatory Visit | Attending: Orthopedic Surgery | Admitting: Orthopedic Surgery

## 2013-11-15 DIAGNOSIS — I10 Essential (primary) hypertension: Secondary | ICD-10-CM | POA: Diagnosis not present

## 2013-11-15 DIAGNOSIS — G7249 Other inflammatory and immune myopathies, not elsewhere classified: Secondary | ICD-10-CM | POA: Diagnosis not present

## 2013-11-15 DIAGNOSIS — E119 Type 2 diabetes mellitus without complications: Secondary | ICD-10-CM | POA: Diagnosis not present

## 2013-11-15 DIAGNOSIS — Z01812 Encounter for preprocedural laboratory examination: Secondary | ICD-10-CM | POA: Diagnosis not present

## 2013-11-15 DIAGNOSIS — IMO0002 Reserved for concepts with insufficient information to code with codable children: Secondary | ICD-10-CM | POA: Diagnosis present

## 2013-11-15 LAB — BASIC METABOLIC PANEL
Anion gap: 8 (ref 5–15)
BUN: 14 mg/dL (ref 6–23)
CALCIUM: 9 mg/dL (ref 8.4–10.5)
CHLORIDE: 97 meq/L (ref 96–112)
CO2: 34 meq/L — AB (ref 19–32)
CREATININE: 0.58 mg/dL (ref 0.50–1.10)
GFR calc non Af Amer: >90 mL/min (ref >90)
Glucose, Bld: 159 mg/dL — ABNORMAL HIGH (ref 70–99)
Potassium: 4 mEq/L (ref 3.7–5.3)
Sodium: 139 mEq/L (ref 137–147)
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
## 2013-11-16 ENCOUNTER — Ambulatory Visit (HOSPITAL_BASED_OUTPATIENT_CLINIC_OR_DEPARTMENT_OTHER)
Admission: RE | Admit: 2013-11-16 | Discharge: 2013-11-16 | Disposition: A | Discharge status: Home / Self Care | Payer: 59 | Source: Ambulatory Visit | Attending: Orthopedic Surgery | Admitting: Orthopedic Surgery

## 2013-11-16 ENCOUNTER — Ambulatory Visit (HOSPITAL_BASED_OUTPATIENT_CLINIC_OR_DEPARTMENT_OTHER): Payer: 59 | Admitting: Anesthesiology

## 2013-11-16 ENCOUNTER — Encounter (HOSPITAL_BASED_OUTPATIENT_CLINIC_OR_DEPARTMENT_OTHER): Payer: 59 | Admitting: Anesthesiology

## 2013-11-16 ENCOUNTER — Encounter (HOSPITAL_BASED_OUTPATIENT_CLINIC_OR_DEPARTMENT_OTHER)
Admission: RE | Disposition: A | Discharge status: Home / Self Care | Payer: Self-pay | Source: Ambulatory Visit | Attending: Orthopedic Surgery

## 2013-11-16 ENCOUNTER — Encounter (HOSPITAL_BASED_OUTPATIENT_CLINIC_OR_DEPARTMENT_OTHER): Payer: Self-pay

## 2013-11-16 ENCOUNTER — Ambulatory Visit (HOSPITAL_COMMUNITY): Payer: 59

## 2013-11-16 DIAGNOSIS — IMO0002 Reserved for concepts with insufficient information to code with codable children: Secondary | ICD-10-CM | POA: Insufficient documentation

## 2013-11-16 DIAGNOSIS — E119 Type 2 diabetes mellitus without complications: Secondary | ICD-10-CM | POA: Insufficient documentation

## 2013-11-16 DIAGNOSIS — I10 Essential (primary) hypertension: Secondary | ICD-10-CM | POA: Insufficient documentation

## 2013-11-16 DIAGNOSIS — Z01812 Encounter for preprocedural laboratory examination: Secondary | ICD-10-CM | POA: Insufficient documentation

## 2013-11-16 DIAGNOSIS — G7249 Other inflammatory and immune myopathies, not elsewhere classified: Secondary | ICD-10-CM | POA: Insufficient documentation

## 2013-11-16 HISTORY — DX: Presence of spectacles and contact lenses: Z97.3

## 2013-11-16 HISTORY — PX: OPEN REDUCTION INTERNAL FIXATION (ORIF) PROXIMAL PHALANX: SHX6235

## 2013-11-16 LAB — GLUCOSE, CAPILLARY
GLUCOSE-CAPILLARY: 134 mg/dL — AB (ref 70–99)
Glucose-Capillary: 189 mg/dL — ABNORMAL HIGH (ref 70–99)

## 2013-11-16 SURGERY — OPEN REDUCTION INTERNAL FIXATION (ORIF) PROXIMAL PHALANX
Anesthesia: General | Site: Finger | Laterality: Right

## 2013-11-16 MED ORDER — HYDROCODONE-ACETAMINOPHEN 5-325 MG PO TABS: 1.0000 | ORAL_TABLET | ORAL | Status: DC | PRN

## 2013-11-16 MED ORDER — MIDAZOLAM HCL 2 MG/2ML IJ SOLN: 1.0000 mg | INTRAMUSCULAR | Status: DC | PRN

## 2013-11-16 MED ORDER — CEFAZOLIN SODIUM-DEXTROSE 2-3 GM-% IV SOLR
2.0000 g | INTRAVENOUS | Status: AC
  Administered 2013-11-16: 2 g via INTRAVENOUS

## 2013-11-16 MED ORDER — BUPIVACAINE-EPINEPHRINE 0.5% -1:200000 IJ SOLN
INTRAMUSCULAR | Status: DC | PRN
  Administered 2013-11-16: 9.5 mL

## 2013-11-16 MED ORDER — MIDAZOLAM HCL 5 MG/5ML IJ SOLN
INTRAMUSCULAR | Status: DC | PRN
  Administered 2013-11-16: 2 mg via INTRAVENOUS

## 2013-11-16 MED ORDER — HYDROMORPHONE HCL PF 1 MG/ML IJ SOLN
INTRAMUSCULAR | Status: AC
  Filled 2013-11-16: qty 1

## 2013-11-16 MED ORDER — LIDOCAINE HCL (CARDIAC) 20 MG/ML IV SOLN
INTRAVENOUS | Status: DC | PRN
  Administered 2013-11-16: 50 mg via INTRAVENOUS

## 2013-11-16 MED ORDER — DEXAMETHASONE SODIUM PHOSPHATE 10 MG/ML IJ SOLN
INTRAMUSCULAR | Status: DC | PRN
  Administered 2013-11-16: 4 mg via INTRAVENOUS

## 2013-11-16 MED ORDER — FENTANYL CITRATE 0.05 MG/ML IJ SOLN: 25.0000 ug | INTRAMUSCULAR | Status: DC | PRN

## 2013-11-16 MED ORDER — OXYCODONE-ACETAMINOPHEN 5-325 MG PO TABS
1.0000 | ORAL_TABLET | ORAL | Status: DC | PRN
Start: 2013-11-16 — End: 2013-11-16

## 2013-11-16 MED ORDER — OXYCODONE HCL 5 MG PO TABS: 5.0000 mg | ORAL_TABLET | Freq: Once | ORAL | Status: DC | PRN

## 2013-11-16 MED ORDER — PROPOFOL 10 MG/ML IV BOLUS
INTRAVENOUS | Status: DC | PRN
  Administered 2013-11-16: 200 mg via INTRAVENOUS

## 2013-11-16 MED ORDER — HYDROMORPHONE HCL PF 1 MG/ML IJ SOLN
0.2500 mg | INTRAMUSCULAR | Status: DC | PRN
  Administered 2013-11-16 (×2): 0.5 mg via INTRAVENOUS

## 2013-11-16 MED ORDER — FENTANYL CITRATE 0.05 MG/ML IJ SOLN
INTRAMUSCULAR | Status: AC
  Filled 2013-11-16: qty 6

## 2013-11-16 MED ORDER — PROPOFOL 10 MG/ML IV BOLUS
INTRAVENOUS | Status: AC
  Filled 2013-11-16: qty 20

## 2013-11-16 MED ORDER — CEFAZOLIN SODIUM-DEXTROSE 2-3 GM-% IV SOLR
INTRAVENOUS | Status: AC
  Filled 2013-11-16: qty 50

## 2013-11-16 MED ORDER — ONDANSETRON HCL 4 MG/2ML IJ SOLN
INTRAMUSCULAR | Status: DC | PRN
  Administered 2013-11-16: 4 mg via INTRAVENOUS

## 2013-11-16 MED ORDER — MIDAZOLAM HCL 2 MG/2ML IJ SOLN
INTRAMUSCULAR | Status: AC
  Filled 2013-11-16: qty 2

## 2013-11-16 MED ORDER — HYDROMORPHONE HCL PF 1 MG/ML IJ SOLN: 0.5000 mg | INTRAMUSCULAR | Status: DC | PRN

## 2013-11-16 MED ORDER — OXYCODONE HCL 5 MG/5ML PO SOLN: 5.0000 mg | Freq: Once | ORAL | Status: DC | PRN

## 2013-11-16 MED ORDER — FENTANYL CITRATE 0.05 MG/ML IJ SOLN
INTRAMUSCULAR | Status: DC | PRN
  Administered 2013-11-16: 25 ug via INTRAVENOUS
  Administered 2013-11-16: 100 ug via INTRAVENOUS
  Administered 2013-11-16 (×3): 25 ug via INTRAVENOUS

## 2013-11-16 MED ORDER — LACTATED RINGERS IV SOLN
INTRAVENOUS | Status: DC
  Administered 2013-11-16 (×2): via INTRAVENOUS

## 2013-11-16 MED ORDER — PROMETHAZINE HCL 25 MG/ML IJ SOLN: 6.2500 mg | INTRAMUSCULAR | Status: DC | PRN

## 2013-11-16 MED ORDER — FENTANYL CITRATE 0.05 MG/ML IJ SOLN: 50.0000 ug | INTRAMUSCULAR | Status: DC | PRN

## 2013-11-16 MED ORDER — OXYCODONE-ACETAMINOPHEN 5-325 MG PO TABS: 1.0000 | ORAL_TABLET | ORAL | Status: DC | PRN

## 2013-11-16 MED ORDER — LACTATED RINGERS IV SOLN: INTRAVENOUS | Status: DC

## 2013-11-16 SURGICAL SUPPLY — 61 items
BANDAGE COBAN STERILE 2 (GAUZE/BANDAGES/DRESSINGS) IMPLANT
BIT DRILL 1.1 (BIT) ×2
BIT DRILL 60X20X1.1XQC TMX (BIT) IMPLANT
BIT DRL 60X20X1.1XQC TMX (BIT) ×1
BLADE MINI RND TIP GREEN BEAV (BLADE) IMPLANT
BLADE SURG 15 STRL LF DISP TIS (BLADE) ×1 IMPLANT
BLADE SURG 15 STRL SS (BLADE) ×2
BNDG CMPR 9X4 STRL LF SNTH (GAUZE/BANDAGES/DRESSINGS) ×1
BNDG COHESIVE 4X5 TAN STRL (GAUZE/BANDAGES/DRESSINGS) ×2 IMPLANT
BNDG ESMARK 4X9 LF (GAUZE/BANDAGES/DRESSINGS) ×1 IMPLANT
BNDG GAUZE ELAST 4 BULKY (GAUZE/BANDAGES/DRESSINGS) ×4 IMPLANT
CHLORAPREP W/TINT 26ML (MISCELLANEOUS) ×2 IMPLANT
COVER MAYO STAND STRL (DRAPES) ×2 IMPLANT
COVER TABLE BACK 60X90 (DRAPES) ×2 IMPLANT
CUFF TOURNIQUET SINGLE 18IN (TOURNIQUET CUFF) ×1 IMPLANT
DRAPE C-ARM 42X72 X-RAY (DRAPES) ×2 IMPLANT
DRAPE EXTREMITY T 121X128X90 (DRAPE) ×2 IMPLANT
DRAPE SURG 17X23 STRL (DRAPES) ×2 IMPLANT
DRSG EMULSION OIL 3X3 NADH (GAUZE/BANDAGES/DRESSINGS) ×2 IMPLANT
GAUZE SPONGE 4X4 12PLY STRL (GAUZE/BANDAGES/DRESSINGS) ×2 IMPLANT
GLOVE BIO SURGEON STRL SZ 6.5 (GLOVE) ×2 IMPLANT
GLOVE BIO SURGEON STRL SZ7.5 (GLOVE) ×2 IMPLANT
GLOVE BIOGEL PI IND STRL 7.0 (GLOVE) IMPLANT
GLOVE BIOGEL PI IND STRL 8 (GLOVE) ×1 IMPLANT
GLOVE BIOGEL PI INDICATOR 7.0 (GLOVE) ×2
GLOVE BIOGEL PI INDICATOR 8 (GLOVE) ×1
GOWN STRL REUS W/ TWL LRG LVL3 (GOWN DISPOSABLE) ×2 IMPLANT
GOWN STRL REUS W/ TWL XL LVL3 (GOWN DISPOSABLE) IMPLANT
GOWN STRL REUS W/TWL LRG LVL3 (GOWN DISPOSABLE) ×4
GOWN STRL REUS W/TWL XL LVL3 (GOWN DISPOSABLE) ×2
K-WIRE 6 .028 (WIRE) ×2
K-WIRE DBL TRONS .035X6 (WIRE) ×2
KWIRE 6 .028 (WIRE) IMPLANT
KWIRE DBL TRONS .035X6 (WIRE) IMPLANT
LOCK SCREW 1.5X9MM (Screw) ×2 IMPLANT
NDL HYPO 25X1 1.5 SAFETY (NEEDLE) IMPLANT
NEEDLE HYPO 25X1 1.5 SAFETY (NEEDLE) IMPLANT
NS IRRIG 1000ML POUR BTL (IV SOLUTION) ×2 IMPLANT
PACK BASIN DAY SURGERY FS (CUSTOM PROCEDURE TRAY) ×2 IMPLANT
PADDING CAST ABS 4INX4YD NS (CAST SUPPLIES)
PADDING CAST ABS COTTON 4X4 ST (CAST SUPPLIES) IMPLANT
PLATE T SMALL 1.5MM (Plate) ×1 IMPLANT
RUBBERBAND STERILE (MISCELLANEOUS) ×2 IMPLANT
SCREW CORTICAL 1.5X9MM WRIST (Screw) ×1 IMPLANT
SCREW L 1.5X12 (Screw) ×1 IMPLANT
SCREW LOCK 1.5X9MM (Screw) IMPLANT
SCREW LOCKING 1.5X8 (Screw) ×1 IMPLANT
SCREW NL 1.5X11 WRIST (Screw) ×2 IMPLANT
SCREW NL 1.5X13 (Screw) ×1 IMPLANT
STOCKINETTE 4X48 STRL (DRAPES) ×2 IMPLANT
SUT ETHILON 4 0 PS 2 18 (SUTURE) ×1 IMPLANT
SUT VIC AB 0 CT1 27 (SUTURE) ×2
SUT VIC AB 0 CT1 27XBRD ANBCTR (SUTURE) IMPLANT
SUT VIC AB 3-0 FS2 27 (SUTURE) ×1 IMPLANT
SUT VICRYL RAPIDE 4-0 (SUTURE) IMPLANT
SUT VICRYL RAPIDE 4/0 PS 2 (SUTURE) IMPLANT
SYR BULB 3OZ (MISCELLANEOUS) ×1 IMPLANT
SYRINGE 12CC LL (MISCELLANEOUS) ×1 IMPLANT
TOWEL OR 17X24 6PK STRL BLUE (TOWEL DISPOSABLE) ×2 IMPLANT
TOWEL OR NON WOVEN STRL DISP B (DISPOSABLE) ×1 IMPLANT
UNDERPAD 30X30 INCONTINENT (UNDERPADS AND DIAPERS) ×2 IMPLANT

## 2013-11-16 NOTE — Discharge Instructions (Addendum)
Discharge Instructions   You have a dressing with a plaster splint incorporated in it. Move your fingers as much as possible, making a full fist and fully opening the fist. Elevate your hand to reduce pain & swelling of the digits.  Ice over the operative site may be helpful to reduce pain & swelling.  DO NOT USE HEAT. Pain medicine has been prescribed for you.  Use your medicine as needed over the first 48 hours, and then you can begin to taper your use.  You may use Tylenol in place of your prescribed pain medication, but not IN ADDITION to it. Leave the dressing in place until you return to our office.  You may shower, but keep the bandage clean & dry.  You may drive a car when you are off of prescription pain medications and can safely control your vehicle with both hands. Our office will call you to arrange follow-up   Please call [phone] during normal business hours or [phone] after hours for any problems. Including the following:  - excessive redness of the incisions - drainage for more than 4 days - fever of more than 101.5 F  *Please note that pain medications will not be refilled after hours or on weekends.   Post Anesthesia Home Care Instructions  Activity: Get plenty of rest for the remainder of the day. A responsible adult should stay with you for 24 hours following the procedure.  For the next 24 hours, DO NOT: -Drive a car -Paediatric nurse -Drink alcoholic beverages -Take any medication unless instructed by your physician -Make any legal decisions or sign important papers.  Meals: Start with liquid foods such as gelatin or soup. Progress to regular foods as tolerated. Avoid greasy, spicy, heavy foods. If nausea and/or vomiting occur, drink only clear liquids until the nausea and/or vomiting subsides. Call your physician if vomiting continues.  Special Instructions/Symptoms: Your throat may feel dry or sore from the anesthesia or the breathing tube  placed in your throat during surgery. If this causes discomfort, gargle with warm salt water. The discomfort should disappear within 24 hours.

## 2013-11-16 NOTE — Anesthesia Procedure Notes (Signed)
Procedure Name: LMA Insertion Date/Time: 11/16/2013 4:21 PM Performed by: Lieutenant Diego Pre-anesthesia Checklist: Patient identified, Emergency Drugs available, Suction available and Patient being monitored Patient Re-evaluated:Patient Re-evaluated prior to inductionOxygen Delivery Method: Circle System Utilized Preoxygenation: Pre-oxygenation with 100% oxygen Intubation Type: IV induction Ventilation: Mask ventilation without difficulty LMA: LMA inserted LMA Size: 4.0 Number of attempts: 1 Airway Equipment and Method: bite block Placement Confirmation: positive ETCO2 and breath sounds checked- equal and bilateral Tube secured with: Tape Dental Injury: Teeth and Oropharynx as per pre-operative assessment

## 2013-11-16 NOTE — Anesthesia Preprocedure Evaluation (Signed)
Anesthesia Evaluation  Patient identified by MRN, date of birth, ID band  History of Anesthesia Complications Negative for: history of anesthetic complications  Airway Mallampati: II      Dental  (+) Teeth Intact   Pulmonary neg pulmonary ROS,  breath sounds clear to auscultation        Cardiovascular hypertension, Rhythm:Regular Rate:Normal     Neuro/Psych    GI/Hepatic negative GI ROS, Neg liver ROS,   Endo/Other  diabetes  Renal/GU negative Renal ROS     Musculoskeletal   Abdominal   Peds  Hematology negative hematology ROS (+)   Anesthesia Other Findings   Reproductive/Obstetrics                           Anesthesia Physical Anesthesia Plan  ASA: II  Anesthesia Plan: General   Post-op Pain Management:    Induction: Intravenous  Airway Management Planned: LMA  Additional Equipment:   Intra-op Plan:   Post-operative Plan: Extubation in OR  Informed Consent: I have reviewed the patients History and Physical, chart, labs and discussed the procedure including the risks, benefits and alternatives for the proposed anesthesia with the patient or authorized representative who has indicated his/her understanding and acceptance.   Dental advisory given  Plan Discussed with: CRNA and Surgeon  Anesthesia Plan Comments:         Anesthesia Quick Evaluation

## 2013-11-16 NOTE — Transfer of Care (Signed)
Immediate Anesthesia Transfer of Care Note  Patient: Shannon [name]  Procedure(s) Performed: Procedure(s): OPEN REDUCTION INTERNAL FIXATION (ORIF) RIGHT RING FINGER PROXIMAL PHALANX FRACTURE  (Right)  Patient Location: PACU  Anesthesia Type:General  Level of Consciousness: awake and alert   Airway & Oxygen Therapy: Patient Spontanous Breathing and Patient connected to face mask oxygen  Post-op Assessment: Report given to PACU RN and Post -op Vital signs reviewed and stable  Post vital signs: Reviewed and stable  Complications: No apparent anesthesia complications
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
## 2013-11-16 NOTE — Interval H&P Note (Signed)
History and Physical Interval Note:  11/16/2013 2:37 PM  Shannon [name]  has presented today for surgery, with the diagnosis of RIGHT RING FINGER PROXIMAL [place]   The various methods of treatment have been discussed with the patient and family. After consideration of risks, benefits and other options for treatment, the patient has consented to  Procedure(s): OPEN REDUCTION INTERNAL FIXATION (ORIF) RIGHT RING FINGER PROXIMAL PHALANX FRACTURE  (Right) as a surgical intervention .  The patient's history has been reviewed, patient examined, no change in status, stable for surgery.  I have reviewed the patient's chart and labs.  Questions were answered to the patient's satisfaction.     Quinterrius Errington A.

## 2013-11-16 NOTE — Anesthesia Postprocedure Evaluation (Signed)
  Anesthesia Post-op Note  Patient: Shannon [name]  Procedure(s) Performed: Procedure(s): OPEN REDUCTION INTERNAL FIXATION (ORIF) RIGHT RING FINGER PROXIMAL PHALANX FRACTURE  (Right)  Patient Location: PACU  Anesthesia Type:General  Level of Consciousness: awake and sedated  Airway and Oxygen Therapy: Patient Spontanous Breathing  Post-op Pain: moderate  Post-op Assessment: Post-op Vital signs reviewed  Post-op Vital Signs: stable  Last Vitals:  Filed Vitals:   11/16/13 1830  BP:   Pulse: 73  Temp:   Resp: 16    Complications: No apparent anesthesia complications

## 2013-11-20 ENCOUNTER — Encounter (HOSPITAL_BASED_OUTPATIENT_CLINIC_OR_DEPARTMENT_OTHER): Payer: Self-pay | Admitting: Orthopedic Surgery

## 2014-01-03 ENCOUNTER — Encounter: Payer: Self-pay | Admitting: Diagnostic Neuroimaging

## 2014-01-03 ENCOUNTER — Ambulatory Visit (INDEPENDENT_AMBULATORY_CARE_PROVIDER_SITE_OTHER): Payer: 59 | Admitting: Diagnostic Neuroimaging

## 2014-01-03 VITALS — BP 121/85 | HR 91 | Temp 98.1°F | Ht 60.0 in | Wt 199.4 lb

## 2014-01-03 DIAGNOSIS — G7249 Other inflammatory and immune myopathies, not elsewhere classified: Secondary | ICD-10-CM

## 2014-01-03 NOTE — Patient Instructions (Signed)
Monitor symptoms.
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
## 2014-01-04 DIAGNOSIS — Z0289 Encounter for other administrative examinations: Secondary | ICD-10-CM

## 2014-01-17 ENCOUNTER — Telehealth: Payer: Self-pay | Admitting: *Deleted

## 2014-01-17 NOTE — Telephone Encounter (Signed)
To [name] in MR. Form for [employer] , FMLA.

## 2014-01-18 ENCOUNTER — Telehealth: Payer: Self-pay | Admitting: *Deleted

## 2014-01-18 NOTE — Telephone Encounter (Signed)
Form [employer] integrated disability received,faxed 01-18-14

## 2014-07-04 ENCOUNTER — Ambulatory Visit (INDEPENDENT_AMBULATORY_CARE_PROVIDER_SITE_OTHER): Payer: 59 | Admitting: Diagnostic Neuroimaging

## 2014-07-04 ENCOUNTER — Encounter: Payer: Self-pay | Admitting: Diagnostic Neuroimaging

## 2014-07-04 VITALS — BP 142/80 | HR 85 | Ht 60.0 in | Wt 202.4 lb

## 2014-07-04 DIAGNOSIS — G7249 Other inflammatory and immune myopathies, not elsewhere classified: Secondary | ICD-10-CM | POA: Diagnosis not present
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
## 2014-07-04 NOTE — Patient Instructions (Signed)
I will setup PT evaluation.

## 2014-09-11 ENCOUNTER — Telehealth: Payer: Self-pay | Admitting: Diagnostic Neuroimaging

## 2014-09-11 NOTE — Telephone Encounter (Signed)
Spoke to the pt on the phone to clarify what was needed. She stated that the [facility] paperwork was not filled out correctly and needed to be redone. I asked her to bring me a copy and we would get it refilled out. She thanked me  When she come by, DO NOT charge her for the [facility] paperwork

## 2014-09-11 NOTE — Telephone Encounter (Signed)
Patient is calling to get her FMLA paperwork updated so she can have physical therapy. Please call patient and discuss. Thank you.

## 2014-09-13 ENCOUNTER — Telehealth: Payer: Self-pay | Admitting: *Deleted

## 2014-09-13 NOTE — Telephone Encounter (Signed)
Form,Fmla [employer] and Dr Leta Baptist 09/13/14.

## 2014-09-16 ENCOUNTER — Telehealth: Payer: Self-pay | Admitting: Diagnostic Neuroimaging

## 2014-09-16 NOTE — Telephone Encounter (Signed)
Called and left a message asking the pt to call me back. When she does, please let her know that her FMLA paperwork is done, faxed and up at the front ready for her to come pick it up. Thanks

## 2014-10-09 ENCOUNTER — Ambulatory Visit: Payer: Self-pay | Admitting: Diagnostic Neuroimaging

## 2014-11-19 NOTE — Telephone Encounter (Signed)
ERROR

## 2014-11-25 ENCOUNTER — Ambulatory Visit: Payer: 59 | Admitting: Diagnostic Neuroimaging

## 2014-12-31 ENCOUNTER — Ambulatory Visit (INDEPENDENT_AMBULATORY_CARE_PROVIDER_SITE_OTHER): Payer: 59 | Admitting: Diagnostic Neuroimaging

## 2014-12-31 ENCOUNTER — Encounter: Payer: Self-pay | Admitting: Diagnostic Neuroimaging

## 2014-12-31 VITALS — BP 161/104 | HR 87 | Ht 60.0 in | Wt 203.0 lb

## 2014-12-31 DIAGNOSIS — G7249 Other inflammatory and immune myopathies, not elsewhere classified: Secondary | ICD-10-CM | POA: Diagnosis not present
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
## 2014-12-31 NOTE — Patient Instructions (Signed)
Monitor symptoms. May need to restart prednisone. Next time, may also consider imuran or methotrexate.

## 2015-03-10 ENCOUNTER — Telehealth: Payer: Self-pay | Admitting: *Deleted

## 2015-03-10 DIAGNOSIS — Z0289 Encounter for other administrative examinations: Secondary | ICD-10-CM

## 2015-03-10 NOTE — Telephone Encounter (Signed)
Form,[employer] previous done in May need updates to form received from Richmond Campbell sent to [facility] and Dr Leta Baptist 03/10/15.

## 2015-03-10 NOTE — Telephone Encounter (Signed)
[employer] forms completed and signed/initialed. Sent to MR for processing.

## 2015-03-10 NOTE — Telephone Encounter (Signed)
Updated as patient requested and on Dr Gladstone Lighter desk for signatures/initialing per patient's request.

## 2015-03-11 ENCOUNTER — Telehealth: Payer: Self-pay | Admitting: *Deleted

## 2015-03-11 NOTE — Telephone Encounter (Signed)
Form,[employer] FMLA received,completed by Dr Leta Baptist and Leilani Able faxed,copy at front desk for patient 03/11/15.

## 2015-04-11 ENCOUNTER — Ambulatory Visit: Payer: 59 | Admitting: Diagnostic Neuroimaging

## 2015-06-04 ENCOUNTER — Encounter: Payer: Self-pay | Admitting: Diagnostic Neuroimaging

## 2015-06-19 ENCOUNTER — Encounter: Payer: Self-pay | Admitting: Diagnostic Neuroimaging

## 2015-06-19 ENCOUNTER — Ambulatory Visit (INDEPENDENT_AMBULATORY_CARE_PROVIDER_SITE_OTHER): Payer: 59 | Admitting: Diagnostic Neuroimaging

## 2015-06-19 ENCOUNTER — Telehealth: Payer: Self-pay | Admitting: *Deleted

## 2015-06-19 VITALS — BP 160/105 | HR 94 | Ht 60.0 in | Wt 199.6 lb

## 2015-06-19 DIAGNOSIS — G7249 Other inflammatory and immune myopathies, not elsewhere classified: Secondary | ICD-10-CM | POA: Diagnosis not present

## 2015-06-19 NOTE — Telephone Encounter (Signed)
LVM requesting call back re: FMLA papers. Left this caller's name, number, office hours.
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
## 2015-06-19 NOTE — Telephone Encounter (Signed)
Form, [employer] received from [facility] sent to [facility] and Dr Leta Baptist 06/19/15.

## 2015-06-19 NOTE — Patient Instructions (Signed)
Thank you for coming to see [place] at [facility]. I hope we have been able to provide you high quality care today.  You may receive a patient satisfaction survey over the next few weeks. We would appreciate your feedback and comments so that we may continue to improve ourselves and the health of our patients.  - monitor symptoms - follow up with Dr. Baird Cancer regarding high blood pressure in next 4 weeks; record BP readings at home   ~~~~~~~~~~~~~~~~~~~~~~~~~~~~~~~~~~~~~~~~~~~~~~~~~~~~~~~~~~~~~~~~~  DR. PENUMALLI'S GUIDE TO HAPPY AND HEALTHY LIVING These are some of my general health and wellness recommendations. Some of them may apply to you better than others. Please use common sense as you try these suggestions and feel free to ask me any questions.   ACTIVITY/FITNESS Mental, social, emotional and physical stimulation are very important for brain and body health. Try learning a new activity (arts, music, language, sports, games).  Keep moving your body to the best of your abilities. You can do this at home, inside or outside, the park, community center, gym or anywhere you like. Consider a physical therapist or personal trainer to get started. Consider the app Sworkit. Fitness trackers such as smart-watches, smart-phones or Fitbits can help as well.   NUTRITION Eat more plants: colorful vegetables, nuts, seeds and berries.  Eat less sugar, salt, preservatives and processed foods.  Avoid toxins such as cigarettes and alcohol.  Drink water when you are thirsty. Warm water with a slice of lemon is an excellent morning drink to start the day.  Consider these websites for more information The Nutrition Source (https://www.henry-hernandez.biz/) Precision Nutrition (WindowBlog.ch)   RELAXATION Consider practicing mindfulness meditation or other relaxation techniques such as deep breathing, prayer, yoga, tai chi, massage. See  website mindful.org or the apps Headspace or Calm to help get started.   SLEEP Try to get at least 7-8+ hours sleep per day. Regular exercise and reduced caffeine will help you sleep better. Practice good sleep hygeine techniques. See website sleep.org for more information.   PLANNING Prepare estate planning, living will, healthcare POA documents. Sometimes this is best planned with the help of an attorney. Theconversationproject.org and agingwithdignity.org are excellent resources.

## 2015-06-20 ENCOUNTER — Telehealth: Payer: Self-pay | Admitting: *Deleted

## 2015-06-20 DIAGNOSIS — Z0289 Encounter for other administrative examinations: Secondary | ICD-10-CM

## 2015-06-20 LAB — CK: Total CK: 310 U/L — ABNORMAL HIGH (ref 24–173)

## 2015-06-20 LAB — ALDOLASE: Aldolase: 5.7 U/L (ref 3.3–10.3)

## 2015-06-20 NOTE — Telephone Encounter (Signed)
LVM informing patient her lab results back, and Aldolase level normal. CK level elevated but much lower than the last lab. Left number for questions.

## 2015-06-20 NOTE — Telephone Encounter (Signed)
Called home phone, did not leave message. Spoke with patient; questions answered. FMLA papers on Dr [employer] desk for review, signature.

## 2015-06-20 NOTE — Telephone Encounter (Signed)
Patient request that nurse call back to cell phone number [phone].

## 2015-06-20 NOTE — Telephone Encounter (Signed)
Patient returned [facility] call.

## 2015-06-23 NOTE — Telephone Encounter (Signed)
Form,[employer] received,completed by Dr Leta Baptist and Leilani Able faxed 06/23/15.

## 2015-06-24 ENCOUNTER — Ambulatory Visit: Payer: 59 | Admitting: Diagnostic Neuroimaging

## 2015-06-25 ENCOUNTER — Ambulatory Visit: Payer: 59 | Admitting: Diagnostic Neuroimaging

## 2015-12-18 ENCOUNTER — Ambulatory Visit (INDEPENDENT_AMBULATORY_CARE_PROVIDER_SITE_OTHER): Payer: 59 | Admitting: Diagnostic Neuroimaging

## 2015-12-18 ENCOUNTER — Encounter: Payer: Self-pay | Admitting: Diagnostic Neuroimaging

## 2015-12-18 VITALS — BP 136/97 | HR 73 | Wt 194.6 lb

## 2015-12-18 DIAGNOSIS — I10 Essential (primary) hypertension: Secondary | ICD-10-CM | POA: Insufficient documentation

## 2015-12-18 DIAGNOSIS — G7249 Other inflammatory and immune myopathies, not elsewhere classified: Secondary | ICD-10-CM

## 2015-12-18 NOTE — Patient Instructions (Addendum)
Thank you for coming to see [place] at [facility]. I hope we have been able to provide you high quality care today.  You may receive a patient satisfaction survey over the next few weeks. We would appreciate your feedback and comments so that we may continue to improve ourselves and the health of our patients.  - consider purchase of BP machine for home; consider [employer] or other smart BP machine   ~~~~~~~~~~~~~~~~~~~~~~~~~~~~~~~~~~~~~~~~~~~~~~~~~~~~~~~~~~~~~~~~~  DR. Shaneequa Bahner'S GUIDE TO HAPPY AND HEALTHY LIVING These are some of my general health and wellness recommendations. Some of them may apply to you better than others. Please use common sense as you try these suggestions and feel free to ask me any questions.   ACTIVITY/FITNESS Mental, social, emotional and physical stimulation are very important for brain and body health. Try learning a new activity (arts, music, language, sports, games).  Keep moving your body to the best of your abilities. You can do this at home, inside or outside, the park, community center, gym or anywhere you like. Consider a physical therapist or personal trainer to get started. Consider the app Sworkit. Fitness trackers such as smart-watches, smart-phones or Fitbits can help as well.   NUTRITION Eat more plants: colorful vegetables, nuts, seeds and berries.  Eat less sugar, salt, preservatives and processed foods.  Avoid toxins such as cigarettes and alcohol.  Drink water when you are thirsty. Warm water with a slice of lemon is an excellent morning drink to start the day.  Consider these websites for more information The Nutrition Source (https://www.henry-hernandez.biz/) Precision Nutrition (WindowBlog.ch)   RELAXATION Consider practicing mindfulness meditation or other relaxation techniques such as deep breathing, prayer, yoga, tai chi, massage. See website mindful.org or the apps Headspace  or Calm to help get started.   SLEEP Try to get at least 7-8+ hours sleep per day. Regular exercise and reduced caffeine will help you sleep better. Practice good sleep hygeine techniques. See website sleep.org for more information.   PLANNING Prepare estate planning, living will, healthcare POA documents. Sometimes this is best planned with the help of an attorney. Theconversationproject.org and agingwithdignity.org are excellent resources.
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
## 2016-03-25 ENCOUNTER — Encounter (HOSPITAL_COMMUNITY): Payer: Self-pay | Admitting: Emergency Medicine

## 2016-03-25 ENCOUNTER — Ambulatory Visit (HOSPITAL_COMMUNITY): Payer: 59

## 2016-03-25 ENCOUNTER — Ambulatory Visit (INDEPENDENT_AMBULATORY_CARE_PROVIDER_SITE_OTHER)
Admission: EM | Admit: 2016-03-25 | Discharge: 2016-03-25 | Disposition: A | Discharge status: Nursing Home (Includes ICF) | Payer: 59 | Source: Home / Self Care

## 2016-03-25 ENCOUNTER — Inpatient Hospital Stay (HOSPITAL_COMMUNITY)
Admission: EM | Admit: 2016-03-25 | Discharge: 2016-03-27 | DRG: 189 | Disposition: A | Discharge status: Home / Self Care | Payer: 59 | Attending: Nephrology | Admitting: Nephrology

## 2016-03-25 ENCOUNTER — Encounter (HOSPITAL_COMMUNITY): Payer: Self-pay

## 2016-03-25 ENCOUNTER — Emergency Department (HOSPITAL_COMMUNITY): Payer: 59

## 2016-03-25 DIAGNOSIS — R0609 Other forms of dyspnea: Secondary | ICD-10-CM | POA: Diagnosis present

## 2016-03-25 DIAGNOSIS — I110 Hypertensive heart disease with heart failure: Secondary | ICD-10-CM | POA: Diagnosis present

## 2016-03-25 DIAGNOSIS — E119 Type 2 diabetes mellitus without complications: Secondary | ICD-10-CM | POA: Diagnosis present

## 2016-03-25 DIAGNOSIS — R9431 Abnormal electrocardiogram [ECG] [EKG]: Secondary | ICD-10-CM | POA: Diagnosis not present

## 2016-03-25 DIAGNOSIS — Z7984 Long term (current) use of oral hypoglycemic drugs: Secondary | ICD-10-CM | POA: Diagnosis not present

## 2016-03-25 DIAGNOSIS — R0602 Shortness of breath: Secondary | ICD-10-CM | POA: Diagnosis present

## 2016-03-25 DIAGNOSIS — G7249 Other inflammatory and immune myopathies, not elsewhere classified: Secondary | ICD-10-CM | POA: Diagnosis present

## 2016-03-25 DIAGNOSIS — E669 Obesity, unspecified: Secondary | ICD-10-CM | POA: Diagnosis present

## 2016-03-25 DIAGNOSIS — R0600 Dyspnea, unspecified: Secondary | ICD-10-CM | POA: Diagnosis present

## 2016-03-25 DIAGNOSIS — J9601 Acute respiratory failure with hypoxia: Principal | ICD-10-CM | POA: Diagnosis present

## 2016-03-25 DIAGNOSIS — R9389 Abnormal findings on diagnostic imaging of other specified body structures: Secondary | ICD-10-CM | POA: Diagnosis present

## 2016-03-25 DIAGNOSIS — E876 Hypokalemia: Secondary | ICD-10-CM | POA: Diagnosis present

## 2016-03-25 DIAGNOSIS — I10 Essential (primary) hypertension: Secondary | ICD-10-CM | POA: Diagnosis present

## 2016-03-25 DIAGNOSIS — Z8249 Family history of ischemic heart disease and other diseases of the circulatory system: Secondary | ICD-10-CM

## 2016-03-25 DIAGNOSIS — Z6835 Body mass index (BMI) 35.0-35.9, adult: Secondary | ICD-10-CM

## 2016-03-25 DIAGNOSIS — Z833 Family history of diabetes mellitus: Secondary | ICD-10-CM | POA: Diagnosis not present

## 2016-03-25 DIAGNOSIS — Z79899 Other long term (current) drug therapy: Secondary | ICD-10-CM | POA: Diagnosis not present

## 2016-03-25 DIAGNOSIS — R938 Abnormal findings on diagnostic imaging of other specified body structures: Secondary | ICD-10-CM | POA: Diagnosis not present

## 2016-03-25 DIAGNOSIS — E118 Type 2 diabetes mellitus with unspecified complications: Secondary | ICD-10-CM

## 2016-03-25 DIAGNOSIS — R05 Cough: Secondary | ICD-10-CM | POA: Diagnosis present

## 2016-03-25 DIAGNOSIS — I509 Heart failure, unspecified: Secondary | ICD-10-CM | POA: Diagnosis present

## 2016-03-25 DIAGNOSIS — R059 Cough, unspecified: Secondary | ICD-10-CM | POA: Diagnosis present

## 2016-03-25 LAB — CBC WITH DIFFERENTIAL/PLATELET
Basophils Absolute: 0 10*3/uL (ref 0.0–0.1)
Basophils Relative: 0 %
Eosinophils Absolute: 0.1 10*3/uL (ref 0.0–0.7)
Eosinophils Relative: 1 %
HEMATOCRIT: 37.3 % (ref 36.0–46.0)
Hemoglobin: 12.9 g/dL (ref 12.0–15.0)
LYMPHS ABS: 4.6 10*3/uL — AB (ref 0.7–4.0)
LYMPHS PCT: 42 %
MCH: 29.1 pg (ref 26.0–34.0)
MCHC: 34.6 g/dL (ref 30.0–36.0)
MCV: 84.2 fL (ref 78.0–100.0)
MONO ABS: 0.8 10*3/uL (ref 0.1–1.0)
MONOS PCT: 7 %
Neutro Abs: 5.4 10*3/uL (ref 1.7–7.7)
Neutrophils Relative %: 50 %
Platelets: 328 10*3/uL (ref 150–400)
RBC: 4.43 MIL/uL (ref 3.87–5.11)
RDW: 14.2 % (ref 11.5–15.5)
WBC: 10.9 10*3/uL — ABNORMAL HIGH (ref 4.0–10.5)

## 2016-03-25 LAB — BASIC METABOLIC PANEL
ANION GAP: 11 (ref 5–15)
BUN: 14 mg/dL (ref 6–20)
CALCIUM: 8.7 mg/dL — AB (ref 8.9–10.3)
CO2: 27 mmol/L (ref 22–32)
CREATININE: 0.69 mg/dL (ref 0.44–1.00)
Chloride: 104 mmol/L (ref 101–111)
GFR calc Af Amer: >60 mL/min (ref >60)
GFR calc non Af Amer: >60 mL/min (ref >60)
GLUCOSE: 185 mg/dL — AB (ref 65–99)
Potassium: 3.1 mmol/L — ABNORMAL LOW (ref 3.5–5.1)
Sodium: 142 mmol/L (ref 135–145)

## 2016-03-25 LAB — I-STAT BETA HCG BLOOD, ED (MC, WL, AP ONLY): I-stat hCG, quantitative: <5 m[IU]/mL (ref <5)

## 2016-03-25 LAB — BRAIN NATRIURETIC PEPTIDE: B NATRIURETIC PEPTIDE 5: 415.5 pg/mL — AB (ref 0.0–100.0)

## 2016-03-25 LAB — I-STAT TROPONIN, ED: Troponin i, poc: 0.08 ng/mL (ref 0.00–0.08)

## 2016-03-25 LAB — GLUCOSE, CAPILLARY: GLUCOSE-CAPILLARY: 319 mg/dL — AB (ref 65–99)

## 2016-03-25 MED ORDER — FUROSEMIDE 10 MG/ML IJ SOLN
20.0000 mg | Freq: Three times a day (TID) | INTRAMUSCULAR | Status: DC
  Administered 2016-03-25 – 2016-03-26 (×2): 20 mg via INTRAVENOUS
  Filled 2016-03-25 (×3): qty 2

## 2016-03-25 MED ORDER — ENOXAPARIN SODIUM 40 MG/0.4ML ~~LOC~~ SOLN
40.0000 mg | SUBCUTANEOUS | Status: DC
  Administered 2016-03-25 – 2016-03-26 (×2): 40 mg via SUBCUTANEOUS
  Filled 2016-03-25 (×2): qty 0.4

## 2016-03-25 MED ORDER — ONDANSETRON HCL 4 MG/2ML IJ SOLN: 4.0000 mg | Freq: Four times a day (QID) | INTRAMUSCULAR | Status: DC | PRN

## 2016-03-25 MED ORDER — ACETAMINOPHEN 325 MG PO TABS: 650.0000 mg | ORAL_TABLET | ORAL | Status: DC | PRN

## 2016-03-25 MED ORDER — SODIUM CHLORIDE 0.9% FLUSH
3.0000 mL | Freq: Two times a day (BID) | INTRAVENOUS | Status: DC
  Administered 2016-03-25 – 2016-03-27 (×3): 3 mL via INTRAVENOUS

## 2016-03-25 MED ORDER — DEXTROSE 5 % IV SOLN
500.0000 mg | Freq: Once | INTRAVENOUS | Status: AC
  Administered 2016-03-25: 500 mg via INTRAVENOUS
  Filled 2016-03-25: qty 500

## 2016-03-25 MED ORDER — ASPIRIN 81 MG PO CHEW
324.0000 mg | CHEWABLE_TABLET | Freq: Once | ORAL | Status: AC
  Administered 2016-03-25: 324 mg via ORAL
  Filled 2016-03-25: qty 4

## 2016-03-25 MED ORDER — METHYLPREDNISOLONE SODIUM SUCC 125 MG IJ SOLR
125.0000 mg | Freq: Once | INTRAMUSCULAR | Status: AC
  Administered 2016-03-25: 125 mg via INTRAVENOUS
  Filled 2016-03-25: qty 2

## 2016-03-25 MED ORDER — INSULIN ASPART 100 UNIT/ML ~~LOC~~ SOLN
0.0000 [IU] | Freq: Every day | SUBCUTANEOUS | Status: DC
  Administered 2016-03-25: 4 [IU] via SUBCUTANEOUS

## 2016-03-25 MED ORDER — INSULIN ASPART 100 UNIT/ML ~~LOC~~ SOLN
0.0000 [IU] | Freq: Three times a day (TID) | SUBCUTANEOUS | Status: DC
  Administered 2016-03-26: 3 [IU] via SUBCUTANEOUS
  Administered 2016-03-26: 2 [IU] via SUBCUTANEOUS
  Administered 2016-03-26: 7 [IU] via SUBCUTANEOUS
  Administered 2016-03-27: 2 [IU] via SUBCUTANEOUS
  Administered 2016-03-27: 1 [IU] via SUBCUTANEOUS

## 2016-03-25 MED ORDER — IOPAMIDOL (ISOVUE-370) INJECTION 76%
INTRAVENOUS | Status: AC
  Administered 2016-03-25: 100 mL
  Filled 2016-03-25: qty 100

## 2016-03-25 MED ORDER — HYDROCHLOROTHIAZIDE 25 MG PO TABS
25.0000 mg | ORAL_TABLET | Freq: Every day | ORAL | Status: DC
  Administered 2016-03-25 – 2016-03-27 (×3): 25 mg via ORAL
  Filled 2016-03-25 (×3): qty 1

## 2016-03-25 MED ORDER — FUROSEMIDE 10 MG/ML IJ SOLN
20.0000 mg | Freq: Once | INTRAMUSCULAR | Status: AC
  Administered 2016-03-25: 20 mg via INTRAVENOUS
  Filled 2016-03-25: qty 2

## 2016-03-25 MED ORDER — VALSARTAN-HYDROCHLOROTHIAZIDE 160-25 MG PO TABS: 1.0000 | ORAL_TABLET | Freq: Every day | ORAL | Status: DC

## 2016-03-25 MED ORDER — METFORMIN HCL 500 MG PO TABS: 1000.0000 mg | ORAL_TABLET | Freq: Every day | ORAL | Status: DC

## 2016-03-25 MED ORDER — IRBESARTAN 300 MG PO TABS
150.0000 mg | ORAL_TABLET | Freq: Every day | ORAL | Status: DC
  Administered 2016-03-25 – 2016-03-27 (×3): 150 mg via ORAL
  Filled 2016-03-25 (×3): qty 1

## 2016-03-25 MED ORDER — SODIUM CHLORIDE 0.9% FLUSH: 3.0000 mL | INTRAVENOUS | Status: DC | PRN

## 2016-03-25 MED ORDER — IPRATROPIUM-ALBUTEROL 0.5-2.5 (3) MG/3ML IN SOLN
3.0000 mL | Freq: Once | RESPIRATORY_TRACT | Status: AC
  Administered 2016-03-25: 3 mL via RESPIRATORY_TRACT
  Filled 2016-03-25: qty 3

## 2016-03-25 MED ORDER — VITAMIN D 1000 UNITS PO TABS
1000.0000 [IU] | ORAL_TABLET | Freq: Every day | ORAL | Status: DC
  Administered 2016-03-25 – 2016-03-27 (×3): 1000 [IU] via ORAL
  Filled 2016-03-25 (×3): qty 1

## 2016-03-25 MED ORDER — DEXTROSE 5 % IV SOLN
1.0000 g | Freq: Once | INTRAVENOUS | Status: AC
  Administered 2016-03-25: 1 g via INTRAVENOUS
  Filled 2016-03-25: qty 10

## 2016-03-25 MED ORDER — SODIUM CHLORIDE 0.9 % IV SOLN: 250.0000 mL | INTRAVENOUS | Status: DC | PRN

## 2016-03-25 NOTE — ED Triage Notes (Signed)
Patient presents with ems from urgent care for shortness of breath.  Per ems the patients oxygen is in the upper 80's on RA, she is normally on RA at home.  She was seen at urgent care today for wheezing and feeling short of breath x 3 days.  They sent her here, the patient denies pain and is alert and oriented.

## 2016-03-25 NOTE — ED Notes (Signed)
Pt ambulated to restroom, pt tolerated well.

## 2016-03-25 NOTE — ED Notes (Signed)
Patient transported to CT 
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
## 2016-03-25 NOTE — ED Notes (Signed)
[facility] has no truck.  Notified ems
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
## 2016-03-25 NOTE — ED Triage Notes (Signed)
Pt has been suffering from a dry cough for about two weeks.  On Monday she started experiencing SOB with exertion.  She also reports waking up every morning with a headache. She denies any chest pain, vision changes, jaw pain, numbness or tingling.  She also denies any fever.

## 2016-03-25 NOTE — ED Notes (Signed)
Admitting MD at bedside.

## 2016-03-26 ENCOUNTER — Inpatient Hospital Stay (HOSPITAL_COMMUNITY): Payer: 59

## 2016-03-26 DIAGNOSIS — I509 Heart failure, unspecified: Secondary | ICD-10-CM

## 2016-03-26 LAB — ECHOCARDIOGRAM COMPLETE
HEIGHTINCHES: 60 in
WEIGHTICAEL: 2936 [oz_av]

## 2016-03-26 LAB — HEMOGLOBIN A1C
HEMOGLOBIN A1C: 8.3 % — AB (ref 4.8–5.6)
MEAN PLASMA GLUCOSE: 192 mg/dL

## 2016-03-26 LAB — BASIC METABOLIC PANEL
ANION GAP: 16 — AB (ref 5–15)
BUN: 19 mg/dL (ref 6–20)
CALCIUM: 9.2 mg/dL (ref 8.9–10.3)
CO2: 29 mmol/L (ref 22–32)
CREATININE: 0.85 mg/dL (ref 0.44–1.00)
Chloride: 95 mmol/L — ABNORMAL LOW (ref 101–111)
GFR calc Af Amer: >60 mL/min (ref >60)
GLUCOSE: 350 mg/dL — AB (ref 65–99)
Potassium: 3.2 mmol/L — ABNORMAL LOW (ref 3.5–5.1)
Sodium: 140 mmol/L (ref 135–145)

## 2016-03-26 LAB — GLUCOSE, CAPILLARY
Glucose-Capillary: 323 mg/dL — ABNORMAL HIGH (ref 65–99)
Glucose-Capillary: 218 mg/dL — ABNORMAL HIGH (ref 65–99)
Glucose-Capillary: 159 mg/dL — ABNORMAL HIGH (ref 65–99)
Glucose-Capillary: 125 mg/dL — ABNORMAL HIGH (ref 65–99)

## 2016-03-26 MED ORDER — METFORMIN HCL 500 MG PO TABS
1000.0000 mg | ORAL_TABLET | Freq: Every day | ORAL | Status: DC
  Administered 2016-03-26: 1000 mg via ORAL
  Filled 2016-03-26: qty 2

## 2016-03-26 MED ORDER — LEVOFLOXACIN 500 MG PO TABS
500.0000 mg | ORAL_TABLET | Freq: Every day | ORAL | Status: DC
  Administered 2016-03-26 – 2016-03-27 (×2): 500 mg via ORAL
  Filled 2016-03-26 (×2): qty 1

## 2016-03-26 MED ORDER — INSULIN ASPART 100 UNIT/ML ~~LOC~~ SOLN
2.0000 [IU] | Freq: Three times a day (TID) | SUBCUTANEOUS | Status: DC
  Administered 2016-03-26 – 2016-03-27 (×4): 2 [IU] via SUBCUTANEOUS

## 2016-03-26 NOTE — Care Management Note (Signed)
Case Management Note  Patient Details  Name: LIDIYA REISE MRN: [id number] Date of Birth: 18-Oct-1965  Subjective/Objective:       Admitted with shortness of breath             Action/Plan: Patient is independent of all of her ADL's; PCP: Maximino Greenland, MD; has private insurance with [facility] with prescription drug coverage; CM will continue to follow for DCP  Expected Discharge Date:   possibly 03/28/2016               Expected Discharge Plan:  Home/Self Care  Discharge planning Services  CM Consult   Status of Service:  In process, will continue to follow  Sherrilyn Rist [phone] 03/26/2016, 1:44 PM

## 2016-03-26 NOTE — Evaluation (Signed)
Physical Therapy Evaluation Patient Details Name: Shannon [name] MRN: [id number] DOB: 07-12-1965 Today's Date: 03/26/2016   History of Present Illness  50 y.o. female with hx of HTN, DM x 10 yrs, "myopathy" 5 yrs ago treated w prednisone x 6-12 mos, obesity presenting with 1-2 week progressive DOE, SOB at rest and marked orthopnea, unable to lie flat  Clinical Impression  Patient seen for mobility assessment. Patient independent with mobility but does demonstrate desaturations with ambulation on room air. Saturations at rest 91%, dropped to 88% with minimal distance and progressed to 77% with increased distance. Improved to 96% with 2 liters. At this time, encouraged pursed lip breathing and increased mobility. No further acute PT needs as patient is independent. Will sign off.     Follow Up Recommendations No PT follow up    Equipment Recommendations  None recommended by PT    Recommendations for Other Services       Precautions / Restrictions Precautions Precaution Comments: watch O2 saturations      Mobility  Bed Mobility Overal bed mobility: Independent             General bed mobility comments: no physical assist required  Transfers Overall transfer level: Independent Equipment used: None                Ambulation/Gait Ambulation/Gait assistance: Independent Ambulation Distance (Feet): 220 Feet Assistive device: None Gait Pattern/deviations: WFL(Within Functional Limits)   Gait velocity interpretation: at or above normal speed for age/gender General Gait Details: ambulated on room air, patient mobilizing well with no overt LOB or instability. However, patient was noted to have descreased saturations with mobility.   Stairs            Wheelchair Mobility    Modified Rankin (Stroke Patients Only)       Balance Overall balance assessment: No apparent balance deficits (not formally assessed)                                           Pertinent Vitals/Pain Pain Assessment: No/denies pain    Home Living Family/patient expects to be discharged to:: Private residence Living Arrangements: Children Available Help at Discharge: Family Type of Home: Apartment Home Access: Stairs to enter Entrance Stairs-Rails: Can reach both Entrance Stairs-Number of Steps: 3 Home Layout: One level Home Equipment: None      Prior Function Level of Independence: Independent         Comments: works full time     Journalist, newspaper   Dominant Hand: Right    Extremity/Trunk Assessment   Upper Extremity Assessment: Overall WFL for tasks assessed           Lower Extremity Assessment: Overall WFL for tasks assessed         Communication   Communication: No difficulties  Cognition Arousal/Alertness: Awake/alert Behavior During Therapy: WFL for tasks assessed/performed Overall Cognitive Status: Within Functional Limits for tasks assessed                      General Comments      Exercises     Assessment/Plan    PT Assessment Patent does not need any further PT services  PT Problem List            PT Treatment Interventions      PT Goals (Current goals can be found in the Care  Plan section)  Acute Rehab PT Goals PT Goal Formulation: All assessment and education complete, DC therapy    Frequency     Barriers to discharge        Co-evaluation               End of Session Equipment Utilized During Treatment: Gait belt;Oxygen Activity Tolerance: Patient tolerated treatment well Patient left: in bed;with call bell/phone within reach Nurse Communication: Mobility status         Time: 9826-4158 PT Time Calculation (min) (ACUTE ONLY): 21 min   Charges:   PT Evaluation $PT Eval Low Complexity: 1 Procedure     PT G Codes:        Duncan Dull 04/02/2016, 9:32 AM Alben Deeds, PT DPT  [phone]

## 2016-03-26 NOTE — Progress Notes (Addendum)
PROGRESS NOTE  Shannon [name] QMG:[id number] DOB: 21-Oct-1965 DOA: 03/25/2016 PCP: Maximino Greenland, MD   LOS: 1 day   Brief Narrative:  50 y.o. female with hx of HTN, DM x 10 yrs, "myopathy" 5 yrs ago treated w prednisone x 6-12 mos, obesity presenting with 1-2 week progressive DOE, SOB at rest and marked orthopnea, unable to lie flat.  In ED CXR "clear" but chest CT with diffuse ground glass changes suggestive or pulm edema.  Assessment & Plan: Active Problems:   Essential hypertension   SOB (shortness of breath)   DOE (dyspnea on exertion)   Cough   Abnormal chest CT   DM2 (diabetes mellitus, type 2) (HCC)   CHF (congestive heart failure) (HCC)   Acute hypoxic respiratory failure due to probable CHF, acute, unknown EF - echo pending - continue Lasix for today, renal function is stable, she is already feeling improved - desatted while working with PT today to 77%, needs diuresis still - ?also if underlying viral illness with recent sick contacts but afebrile at home and without cough   Addendum: 2D echo returned within norma limits, EF is normal 32-67%, normal diastolic function. CT findings may be more related to atypical infectious process, discontinue Lasix and start Levaquin  History of inflammatory myopathy - stable, off medications, followed by Dr. Leta Baptist as an outpatient  HTN  - on ARB/ HCT combination, continue, BP 143/97 this morning  DM2  - continue SSI - CBGs elevated to 300s this morning, add scheduled insulin with meals, check A1C - hold Metformin since she had IV contrast  Obesity   DVT prophylaxis: Lovenox Code Status: Full Family Communication: mother bedside Disposition Plan: home when ready   Consultants:   none  Procedures:   2D echo: pending  Antimicrobials:  Ceftriaxone / Azithromycin x 1 in the ER   Subjective: - no chest pain, shortness of breath, no abdominal pain, nausea or vomiting. Feels overall  better  Objective: Vitals:   03/25/16 2104 03/26/16 0136 03/26/16 0611 03/26/16 0853  BP: (!) 156/97 (!) 145/81 (!) 145/86 (!) 143/97  Pulse: 81 83 73 90  Resp: (!) _0 Temp: 98.2 F (36.8 C) 98.2 F (36.8 C) 97.5 F (36.4 C) 97.9 F (36.6 C)  TempSrc: Oral Oral Oral Oral  SpO2: 100% 97% 95% 95%  Weight: 85.3 kg (188 lb)  83.2 kg (183 lb 8 oz)   Height: 5' (1.524 m)       Intake/Output Summary (Last 24 hours) at 03/26/16 1004 Last data filed at 03/26/16 0853  Gross per 24 hour  Intake              490 ml  Output             3000 ml  Net            -2510 ml   Filed Weights   03/25/16 1456 03/25/16 2104 03/26/16 0611  Weight: 90.7 kg (200 lb) 85.3 kg (188 lb) 83.2 kg (183 lb 8 oz)    Examination: Constitutional: NAD Vitals:   03/25/16 2104 03/26/16 0136 03/26/16 0611 03/26/16 0853  BP: (!) 156/97 (!) 145/81 (!) 145/86 (!) 143/97  Pulse: 81 83 73 90  Resp: (!) _1 Temp: 98.2 F (36.8 C) 98.2 F (36.8 C) 97.5 F (36.4 C) 97.9 F (36.6 C)  TempSrc: Oral Oral Oral Oral  SpO2: 100% 97% 95% 95%  Weight: 85.3 kg (188 lb)  83.2 kg (  183 lb 8 oz)   Height: 5' (1.524 m)      Eyes: PERRL, lids and conjunctivae normal ENMT: Mucous membranes are moist. No oropharyngeal exudates Respiratory: clear to auscultation bilaterally, no wheezing, no crackles noted. Normal respiratory effort.  Cardiovascular: Regular rate and rhythm, no murmurs / rubs / gallops. Trace LE edema. 2+ pedal pulses.  Abdomen: no tenderness. Bowel sounds positive.  Neurologic: nonfocal   Data Reviewed: I have personally reviewed following labs and imaging studies  CBC:  Recent Labs Lab 03/25/16 1541  WBC 10.9*  NEUTROABS 5.4  HGB 12.9  HCT 37.3  MCV 84.2  PLT 778   Basic Metabolic Panel:  Recent Labs Lab 03/25/16 1541 03/26/16 0341  NA 142 140  K 3.1* 3.2*  CL 104 95*  CO2 27 29  GLUCOSE 185* 350*  BUN 14 19  CREATININE 0.69 0.85  CALCIUM 8.7* 9.2    GFR: Estimated Creatinine Clearance: 77.4 mL/min (by C-G formula based on SCr of 0.85 mg/dL). Liver Function Tests: No results for input(s): AST, ALT, ALKPHOS, BILITOT, PROT, ALBUMIN in the last 168 hours. No results for input(s): LIPASE, AMYLASE in the last 168 hours. No results for input(s): AMMONIA in the last 168 hours. Coagulation Profile: No results for input(s): INR, PROTIME in the last 168 hours. Cardiac Enzymes: No results for input(s): CKTOTAL, CKMB, CKMBINDEX, TROPONINI in the last 168 hours. BNP (last 3 results) No results for input(s): PROBNP in the last 8760 hours. HbA1C: No results for input(s): HGBA1C in the last 72 hours. CBG:  Recent Labs Lab 03/25/16 2104 03/26/16 0620  GLUCAP 319* 323*   Lipid Profile: No results for input(s): CHOL, HDL, LDLCALC, TRIG, CHOLHDL, LDLDIRECT in the last 72 hours. Thyroid Function Tests: No results for input(s): TSH, T4TOTAL, FREET4, T3FREE, THYROIDAB in the last 72 hours. Anemia Panel: No results for input(s): VITAMINB12, FOLATE, FERRITIN, TIBC, IRON, RETICCTPCT in the last 72 hours. Urine analysis:    Component Value Date/Time   COLORURINE RED BIOCHEMICALS MAY BE AFFECTED BY COLOR (A) 04/08/2010 2042   APPEARANCEUR CLOUDY (A) 04/08/2010 2042   LABSPEC 1.015 09/13/2010 1924   PHURINE 6.5 09/13/2010 1924   GLUCOSEU 500 (A) 09/13/2010 1924   HGBUR NEGATIVE 09/13/2010 1924   BILIRUBINUR NEGATIVE 09/13/2010 1924   KETONESUR NEGATIVE 09/13/2010 1924   PROTEINUR NEGATIVE 09/13/2010 1924   UROBILINOGEN 0.2 09/13/2010 1924   NITRITE NEGATIVE 09/13/2010 1924   LEUKOCYTESUR  09/13/2010 1924    NEGATIVE Biochemical Testing Only. Please order routine urinalysis from main lab if confirmatory testing is needed.   Sepsis Labs: Invalid input(s): PROCALCITONIN, LACTICIDVEN  No results found for this or any previous visit (from the past 240 hour(s)).    Radiology Studies: Dg Chest 2 View  Result Date: 03/25/2016 CLINICAL DATA:   Shortness of breath for several days EXAM: CHEST  2 VIEW COMPARISON:  Sep 13, 2010 FINDINGS: There is no edema or consolidation. The heart size and pulmonary vascularity are normal. No adenopathy. No bone lesions. IMPRESSION: No edema or consolidation. Electronically Signed   By: Lowella Grip III M.D.   On: 03/25/2016 15:40   Ct Angio Chest Pe W And/or Wo Contrast  Result Date: 03/25/2016 CLINICAL DATA:  50 year old female with shortness of breath and cough for 2 weeks. EXAM: CT ANGIOGRAPHY CHEST WITH CONTRAST TECHNIQUE: Multidetector CT imaging of the chest was performed using the standard protocol during bolus administration of intravenous contrast. Multiplanar CT image reconstructions and MIPs were obtained to evaluate the vascular anatomy.  CONTRAST:  100 cc intravenous Isovue 370 COMPARISON:  03/25/2016 radiographs FINDINGS: This is a technically borderline study due to some respiratory motion artifact and less than optimal contrast opacification of the lower lung pulmonary arteries. Cardiovascular: Cardiomegaly identified. No pulmonary emboli are identified. The main pulmonary artery is enlarged measuring 3.6 cm in greatest diameter. There is no evidence of thoracic aortic aneurysm. An aberrant right subclavian artery is present. A small pericardial effusion is noted. Mediastinum/Nodes: No mediastinal mass identified. Shotty mediastinal and hilar lymph nodes are noted. Lungs/Pleura: Moderate ground-glass opacities bilaterally are noted likely representing edema. More focal airspace disease within the anterior right upper lobe may represent focal edema or possibly superimposed pneumonia. No discrete mass identified. There is no evidence of pleural effusion or pneumothorax. Upper Abdomen: No acute abnormality Musculoskeletal: No acute or suspicious abnormalities. Review of the MIP images confirms the above findings. IMPRESSION: Diffuse bilateral ground-glass opacities -favor pulmonary edema over  infection. More focal airspace disease within the right upper lobe may represent superimposed pneumonia or more focal edema. No evidence of pulmonary emboli, although less than optimal contrast opacification of the lower lung pulmonary arteries. Cardiomegaly and small pericardial effusion. Enlarged main pulmonary artery compatible with pulmonary arterial hypertension. Electronically Signed   By: Margarette Canada M.D.   On: 03/25/2016 17:18   Scheduled Meds: . cholecalciferol  1,000 Units Oral Daily  . enoxaparin (LOVENOX) injection  40 mg Subcutaneous Q24H  . furosemide  20 mg Intravenous Q8H  . irbesartan  150 mg Oral Daily   And  . hydrochlorothiazide  25 mg Oral Daily  . insulin aspart  0-5 Units Subcutaneous QHS  . insulin aspart  0-9 Units Subcutaneous TID WC  . metFORMIN  1,000 mg Oral Q breakfast  . sodium chloride flush  3 mL Intravenous Q12H   Continuous Infusions:   Marzetta Board, MD, PhD [facility] Pager [phone]  If 7PM-7AM, please contact night-coverage [email or web address] Password [email or web address] 03/26/2016, 10:04 AM

## 2016-03-26 NOTE — Progress Notes (Signed)
*  PRELIMINARY RESULTS* Echocardiogram 2D Echocardiogram has been performed.  Leavy Cella 03/26/2016, 2:49 PM

## 2016-03-26 NOTE — Progress Notes (Signed)
Echo Tech at bedside

## 2016-03-27 DIAGNOSIS — I509 Heart failure, unspecified: Secondary | ICD-10-CM

## 2016-03-27 DIAGNOSIS — E118 Type 2 diabetes mellitus with unspecified complications: Secondary | ICD-10-CM

## 2016-03-27 DIAGNOSIS — R05 Cough: Secondary | ICD-10-CM

## 2016-03-27 LAB — BASIC METABOLIC PANEL
Anion gap: 16 — ABNORMAL HIGH (ref 5–15)
BUN: 26 mg/dL — ABNORMAL HIGH (ref 6–20)
CALCIUM: 9 mg/dL (ref 8.9–10.3)
CHLORIDE: 94 mmol/L — AB (ref 101–111)
CO2: 30 mmol/L (ref 22–32)
Creatinine, Ser: 0.84 mg/dL (ref 0.44–1.00)
GFR calc Af Amer: >60 mL/min (ref >60)
GFR calc non Af Amer: >60 mL/min (ref >60)
GLUCOSE: 127 mg/dL — AB (ref 65–99)
Potassium: 2.8 mmol/L — ABNORMAL LOW (ref 3.5–5.1)
Sodium: 140 mmol/L (ref 135–145)

## 2016-03-27 LAB — GLUCOSE, CAPILLARY
GLUCOSE-CAPILLARY: 186 mg/dL — AB (ref 65–99)
Glucose-Capillary: 145 mg/dL — ABNORMAL HIGH (ref 65–99)

## 2016-03-27 LAB — POTASSIUM: POTASSIUM: 4.1 mmol/L (ref 3.5–5.1)

## 2016-03-27 MED ORDER — LEVOFLOXACIN 500 MG PO TABS: 500.0000 mg | ORAL_TABLET | Freq: Every day | ORAL | 0 refills | Status: AC

## 2016-03-27 MED ORDER — POTASSIUM CHLORIDE ER 10 MEQ PO TBCR: 10.0000 meq | EXTENDED_RELEASE_TABLET | Freq: Every day | ORAL | 0 refills | Status: DC

## 2016-03-27 MED ORDER — SODIUM CHLORIDE 0.9 % IV SOLN
30.0000 meq | Freq: Once | INTRAVENOUS | Status: AC
  Administered 2016-03-27: 30 meq via INTRAVENOUS
  Filled 2016-03-27: qty 15

## 2016-03-27 MED ORDER — POTASSIUM CHLORIDE CRYS ER 20 MEQ PO TBCR
40.0000 meq | EXTENDED_RELEASE_TABLET | ORAL | Status: AC
  Administered 2016-03-27 (×2): 40 meq via ORAL
  Filled 2016-03-27 (×2): qty 2

## 2016-03-27 NOTE — Discharge Summary (Signed)
Physician Discharge Summary  Shannon [name] KGM:[id number] DOB: April 04, 1966 DOA: 03/25/2016  PCP: Maximino Greenland, MD  Admit date: 03/25/2016 Discharge date: 03/27/2016  Admitted From:home Disposition:home  Recommendations for Outpatient Follow-up:  1. Follow up with PCP in 1-2 weeks 2. Please obtain BMP/CBC in one week   Home Health:no Equipment/Devices:no Discharge Condition:stable CODE STATUS:full Diet recommendation:Carotid modified heart healthy diet.  Brief/Interim Summary:50 y.o.femalewith hx of HTN, DM x 10 yrs, "myopathy" 5 yrs ago treated w prednisone x 6-12 mos, obesity presenting with 1-2 week progressive DOE, SOB at rest and marked orthopnea, unable to lie flat. In ED CXR "clear" but chest CT with diffuse ground glass changes suggestive or pulm edema.   Please see below for detail.   #Acute hypoxic respiratory failure due to probable CHF vs acute bronchitis: Echo showed normal systolic function. Unknown if patient's symptoms are due to acute bronchitis versus atypical infectious process. Initially treated with IV Lasix. After echo finding the Lasix was discontinued and started on Levaquin. Patient clinically improved. Today her oxygen saturation is acceptable intramural. Told to ambulate without difficulties. Reports clinical improvement. Advised outpatient follow-up.  #Hypokalemia likely in the setting of diuretics:Repleted potassium chloride today. Patient is being discharge home with oral potassium chloride. I advised patient to monitor labs in a week with PCP.  #History of inflammatory myopathy - stable, off medications, followed by Dr. Leta Baptist as an outpatient  #HTN  - on ARB/ HCT combination,monitor BP.  #DM2  - continue metformin. Advised outpatient follow-up with PCP.  Obesity  Discharge Diagnoses:  Active Problems:   Essential hypertension   SOB (shortness of breath)   DOE (dyspnea on exertion)   Cough   Abnormal chest CT   DM2 (diabetes  mellitus, type 2) (HCC)   CHF (congestive heart failure) [facility])    Discharge Instructions  Discharge Instructions    Call MD for:  difficulty breathing, headache or visual disturbances    Complete by:  As directed    Call MD for:  hives    Complete by:  As directed    Call MD for:  persistant dizziness or light-headedness    Complete by:  As directed    Call MD for:  persistant nausea and vomiting    Complete by:  As directed    Call MD for:  severe uncontrolled pain    Complete by:  As directed    Call MD for:  temperature >100.4    Complete by:  As directed    Diet - low sodium heart healthy    Complete by:  As directed    Diet Carb Modified    Complete by:  As directed    Discharge instructions    Complete by:  As directed    -low salt diet and follow up with PCP in 1 week.  Please check lab BMP in one week.   Increase activity slowly    Complete by:  As directed        Medication List    TAKE these medications   cholecalciferol 1000 units tablet Commonly known as:  VITAMIN D Take 1,000 Units by mouth daily.   levofloxacin 500 MG tablet Commonly known as:  LEVAQUIN Take 1 tablet (500 mg total) by mouth daily. Start taking on:  03/28/2016   metFORMIN 1000 MG tablet Commonly known as:  GLUCOPHAGE Take 1,000 mg by mouth daily with breakfast.   potassium chloride 10 MEQ tablet Commonly known as:  K-DUR Take 1 tablet (10 mEq total)  by mouth daily.   valsartan-hydrochlorothiazide 160-25 MG tablet Commonly known as:  DIOVAN-HCT Take 1 tablet by mouth daily.      Follow-up Information    Maximino Greenland, MD. Schedule an appointment as soon as possible for a visit in 1 week(s).   Specialty:  Internal Medicine Contact information: [address] [place] [address] [phone]          No Known Allergies  Consultations: None  Procedures/Studies: Echo  Subjective: Patient was seen and examined at bedside. Patient was sitting on bed.  Not on oxygen. Denies fever, chills, headache, dizziness, shortness of breath, cough or chest pain. Feeling good.   Discharge Exam: Vitals:   03/27/16 0900 03/27/16 1200  BP: (!) 118/94 112/73  Pulse: 88 86  Resp:  20  Temp:  98 F (36.7 C)   Vitals:   03/27/16 0455 03/27/16 0900 03/27/16 0953 03/27/16 1200  BP: 130/88 (!) 118/94  112/73  Pulse: 72 88  86  Resp: 18   20  Temp: 98.2 F (36.8 C)   98 F (36.7 C)  TempSrc: Oral   Oral  SpO2: 100%  98% 94%  Weight: 83.4 kg (183 lb 14.4 oz)     Height:        General: Pt is alert, awake, not in acute distress Cardiovascular: RRR, S1/S2 +, no rubs, no gallops Respiratory: CTA bilaterally, no wheezing, no rhonchi Abdominal: Soft, NT, ND, bowel sounds + Extremities: no edema, no cyanosis Neurologic: Alert and awake following commands. Musculoskeletal: Muscle strength 5 over 5 in all extremities.   The results of significant diagnostics from this hospitalization (including imaging, microbiology, ancillary and laboratory) are listed below for reference.     Microbiology: No results found for this or any previous visit (from the past 240 hour(s)).   Labs: BNP (last 3 results)  Recent Labs  03/25/16 1541  BNP 604.5*   Basic Metabolic Panel:  Recent Labs Lab 03/25/16 1541 03/26/16 0341 03/27/16 0307 03/27/16 1243  NA 142 140 140  --   K 3.1* 3.2* 2.8* 4.1  CL 104 95* 94*  --   CO2 _0 --   GLUCOSE 185* 350* 127*  --   BUN 14 19 26*  --   CREATININE 0.69 0.85 0.84  --   CALCIUM 8.7* 9.2 9.0  --    Liver Function Tests: No results for input(s): AST, ALT, ALKPHOS, BILITOT, PROT, ALBUMIN in the last 168 hours. No results for input(s): LIPASE, AMYLASE in the last 168 hours. No results for input(s): AMMONIA in the last 168 hours. CBC:  Recent Labs Lab 03/25/16 1541  WBC 10.9*  NEUTROABS 5.4  HGB 12.9  HCT 37.3  MCV 84.2  PLT 328   Cardiac Enzymes: No results for input(s): CKTOTAL, CKMB, CKMBINDEX,  TROPONINI in the last 168 hours. BNP: Invalid input(s): POCBNP CBG:  Recent Labs Lab 03/26/16 1209 03/26/16 1629 03/26/16 2122 03/27/16 0526 03/27/16 1104  GLUCAP 218* 159* 125* 145* 186*   D-Dimer No results for input(s): DDIMER in the last 72 hours. Hgb A1c  Recent Labs  03/26/16 0855  HGBA1C 8.3*   Lipid Profile No results for input(s): CHOL, HDL, LDLCALC, TRIG, CHOLHDL, LDLDIRECT in the last 72 hours. Thyroid function studies No results for input(s): TSH, T4TOTAL, T3FREE, THYROIDAB in the last 72 hours.  Invalid input(s): FREET3 Anemia work up No results for input(s): VITAMINB12, FOLATE, FERRITIN, TIBC, IRON, RETICCTPCT in the last 72 hours. Urinalysis  Component Value Date/Time   COLORURINE RED BIOCHEMICALS MAY BE AFFECTED BY COLOR (A) 04/08/2010 2042   APPEARANCEUR CLOUDY (A) 04/08/2010 2042   LABSPEC 1.015 09/13/2010 1924   PHURINE 6.5 09/13/2010 1924   GLUCOSEU 500 (A) 09/13/2010 1924   HGBUR NEGATIVE 09/13/2010 1924   BILIRUBINUR NEGATIVE 09/13/2010 1924   KETONESUR NEGATIVE 09/13/2010 1924   PROTEINUR NEGATIVE 09/13/2010 1924   UROBILINOGEN 0.2 09/13/2010 1924   NITRITE NEGATIVE 09/13/2010 1924   LEUKOCYTESUR  09/13/2010 1924    NEGATIVE Biochemical Testing Only. Please order routine urinalysis from main lab if confirmatory testing is needed.   Sepsis Labs Invalid input(s): PROCALCITONIN,  WBC,  LACTICIDVEN Microbiology No results found for this or any previous visit (from the past 240 hour(s)).   Time coordinating discharge: 28 minutes  SIGNED:   Rosita Fire, MD  [facility] 03/27/2016, 2:36 PM  If 7PM-7AM, please contact night-coverage [email or web address] Password [email or web address]

## 2016-05-05 ENCOUNTER — Other Ambulatory Visit: Payer: Self-pay | Admitting: Nurse Practitioner

## 2016-05-05 ENCOUNTER — Ambulatory Visit
Admission: RE | Admit: 2016-05-05 | Discharge: 2016-05-05 | Disposition: A | Discharge status: Home / Self Care | Payer: 59 | Source: Ambulatory Visit | Attending: Nurse Practitioner | Admitting: Nurse Practitioner

## 2016-05-05 DIAGNOSIS — J690 Pneumonitis due to inhalation of food and vomit: Secondary | ICD-10-CM

## 2016-06-21 ENCOUNTER — Encounter: Payer: Self-pay | Admitting: Diagnostic Neuroimaging

## 2016-06-21 ENCOUNTER — Ambulatory Visit (INDEPENDENT_AMBULATORY_CARE_PROVIDER_SITE_OTHER): Payer: 59 | Admitting: Diagnostic Neuroimaging

## 2016-06-21 VITALS — BP 138/96 | HR 88 | Wt 195.4 lb

## 2016-06-21 DIAGNOSIS — R05 Cough: Secondary | ICD-10-CM | POA: Diagnosis not present

## 2016-06-21 DIAGNOSIS — I10 Essential (primary) hypertension: Secondary | ICD-10-CM

## 2016-06-21 DIAGNOSIS — G7249 Other inflammatory and immune myopathies, not elsewhere classified: Secondary | ICD-10-CM

## 2016-06-21 DIAGNOSIS — R059 Cough, unspecified: Secondary | ICD-10-CM
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
## 2016-06-21 NOTE — Patient Instructions (Signed)
Thank you for coming to see [place] at [facility]. I hope we have been able to provide you high quality care today.  You may receive a patient satisfaction survey over the next few weeks. We would appreciate your feedback and comments so that we may continue to improve ourselves and the health of our patients.     ~~~~~~~~~~~~~~~~~~~~~~~~~~~~~~~~~~~~~~~~~~~~~~~~~~~~~~~~~~~~~~~~~  DR. Maylee Bare'S GUIDE TO HAPPY AND HEALTHY LIVING These are some of my general health and wellness recommendations. Some of them may apply to you better than others. Please use common sense as you try these suggestions and feel free to ask me any questions.   ACTIVITY/FITNESS Mental, social, emotional and physical stimulation are very important for brain and body health. Try learning a new activity (arts, music, language, sports, games).  Keep moving your body to the best of your abilities. You can do this at home, inside or outside, the park, community center, gym or anywhere you like. Consider a physical therapist or personal trainer to get started. Consider the app Sworkit. Fitness trackers such as smart-watches, smart-phones or Fitbits can help as well.   NUTRITION Eat more plants: colorful vegetables, nuts, seeds and berries.  Eat less sugar, salt, preservatives and processed foods.  Avoid toxins such as cigarettes and alcohol.  Drink water when you are thirsty. Warm water with a slice of lemon is an excellent morning drink to start the day.  Consider these websites for more information The Nutrition Source (https://www.henry-hernandez.biz/) Precision Nutrition (WindowBlog.ch)   RELAXATION Consider practicing mindfulness meditation or other relaxation techniques such as deep breathing, prayer, yoga, tai chi, massage. See website mindful.org or the apps Headspace or Calm to help get started.   SLEEP Try to get at least 7-8+ hours sleep per  day. Regular exercise and reduced caffeine will help you sleep better. Practice good sleep hygeine techniques. See website sleep.org for more information.   PLANNING Prepare estate planning, living will, healthcare POA documents. Sometimes this is best planned with the help of an attorney. Theconversationproject.org and agingwithdignity.org are excellent resources.

## 2016-06-24 ENCOUNTER — Telehealth: Payer: Self-pay | Admitting: *Deleted

## 2016-06-24 NOTE — Telephone Encounter (Signed)
LVM informing patient that her [employer] papers are ready for pick up. Advised office is now closed, open tomorrow 8 am- 5 pm. Left number.

## 2016-12-20 ENCOUNTER — Ambulatory Visit (INDEPENDENT_AMBULATORY_CARE_PROVIDER_SITE_OTHER): Payer: 59 | Admitting: Diagnostic Neuroimaging

## 2016-12-20 ENCOUNTER — Encounter: Payer: Self-pay | Admitting: Diagnostic Neuroimaging

## 2016-12-20 VITALS — BP 137/96 | HR 89 | Wt 189.8 lb

## 2016-12-20 DIAGNOSIS — I10 Essential (primary) hypertension: Secondary | ICD-10-CM

## 2016-12-20 DIAGNOSIS — G729 Myopathy, unspecified: Secondary | ICD-10-CM | POA: Diagnosis not present

## 2016-12-20 DIAGNOSIS — G7249 Other inflammatory and immune myopathies, not elsewhere classified: Secondary | ICD-10-CM | POA: Diagnosis not present
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
## 2016-12-20 NOTE — Patient Instructions (Signed)
-   check labs today

## 2016-12-29 LAB — COMPREHENSIVE METABOLIC PANEL
A/G RATIO: 1.2 (ref 1.2–2.2)
ALBUMIN: 4.4 g/dL (ref 3.5–5.5)
ALT: 25 IU/L (ref 0–32)
AST: 17 IU/L (ref 0–40)
Alkaline Phosphatase: 50 IU/L (ref 39–117)
BILIRUBIN TOTAL: 0.6 mg/dL (ref 0.0–1.2)
BUN / CREAT RATIO: 20 (ref 9–23)
BUN: 14 mg/dL (ref 6–24)
CHLORIDE: 90 mmol/L — AB (ref 96–106)
CO2: 25 mmol/L (ref 20–29)
Calcium: 10 mg/dL (ref 8.7–10.2)
Creatinine, Ser: 0.71 mg/dL (ref 0.57–1.00)
GFR, EST AFRICAN AMERICAN: 116 mL/min/{1.73_m2} (ref >59)
GFR, EST NON AFRICAN AMERICAN: 101 mL/min/{1.73_m2} (ref >59)
GLOBULIN, TOTAL: 3.6 g/dL (ref 1.5–4.5)
Glucose: 129 mg/dL — ABNORMAL HIGH (ref 65–99)
POTASSIUM: 3.4 mmol/L — AB (ref 3.5–5.2)
SODIUM: 137 mmol/L (ref 134–144)
TOTAL PROTEIN: 8 g/dL (ref 6.0–8.5)

## 2016-12-29 LAB — CBC WITH DIFFERENTIAL/PLATELET
BASOS: 0 %
Basophils Absolute: 0 10*3/uL (ref 0.0–0.2)
EOS (ABSOLUTE): 0.2 10*3/uL (ref 0.0–0.4)
EOS: 2 %
HEMATOCRIT: 40.3 % (ref 34.0–46.6)
HEMOGLOBIN: 13.7 g/dL (ref 11.1–15.9)
IMMATURE GRANS (ABS): 0 10*3/uL (ref 0.0–0.1)
Immature Granulocytes: 0 %
LYMPHS: 49 %
Lymphocytes Absolute: 4.8 10*3/uL — ABNORMAL HIGH (ref 0.7–3.1)
MCH: 29.7 pg (ref 26.6–33.0)
MCHC: 34 g/dL (ref 31.5–35.7)
MCV: 87 fL (ref 79–97)
Monocytes Absolute: 0.7 10*3/uL (ref 0.1–0.9)
Monocytes: 7 %
NEUTROS ABS: 4.1 10*3/uL (ref 1.4–7.0)
Neutrophils: 42 %
PLATELETS: 423 10*3/uL — AB (ref 150–379)
RBC: 4.62 x10E6/uL (ref 3.77–5.28)
RDW: 13.7 % (ref 12.3–15.4)
WBC: 9.8 10*3/uL (ref 3.4–10.8)

## 2016-12-29 LAB — THIOPURINE METHYLTRANSFERASE (TPMT), RBC: TPMT Activity:: 22.3 Units/mL RBC

## 2016-12-29 LAB — ALDOLASE: Aldolase: 5.5 U/L (ref 3.3–10.3)

## 2016-12-29 LAB — CK: Total CK: 307 U/L — ABNORMAL HIGH (ref 24–173)

## 2017-01-13 ENCOUNTER — Telehealth: Payer: Self-pay | Admitting: *Deleted

## 2017-01-13 NOTE — Telephone Encounter (Signed)
LVM informing patient that her labs may have been released to my chart. If not, requested she call back to get results.

## 2017-01-17 ENCOUNTER — Telehealth: Payer: Self-pay | Admitting: *Deleted

## 2017-01-17 NOTE — Telephone Encounter (Signed)
Called and LVM for pt to call about lab results.   Okay to inform patient labs stable per Dr Leta Baptist.

## 2017-01-17 NOTE — Telephone Encounter (Signed)
-----  Message from Vikram R Penumalli, MD sent at 01/11/2017  4:59 PM EDT ----- Labs stable. Please call patient. -VRP 

## 2017-01-18 NOTE — Telephone Encounter (Signed)
LMVM for pt to return call for lab results (stable),  If she call back you may lt her know that they are stable.

## 2017-01-18 NOTE — Telephone Encounter (Signed)
-----  Message from Penni Bombard, MD sent at 01/11/2017  4:59 PM EDT ----- Labs stable. Please call patient. -VRP

## 2017-01-19 ENCOUNTER — Telehealth: Payer: Self-pay | Admitting: *Deleted

## 2017-01-19 ENCOUNTER — Encounter: Payer: Self-pay | Admitting: *Deleted

## 2017-01-19 NOTE — Telephone Encounter (Signed)
-----  Message from Vikram R Penumalli, MD sent at 01/11/2017  4:59 PM EDT ----- Labs stable. Please call patient. -VRP 

## 2017-01-19 NOTE — Telephone Encounter (Signed)
Mailed letter to pt relaying to her lab results as we have not received call back for her lab results.

## 2017-01-20 NOTE — Telephone Encounter (Signed)
Pt returned RN's call. Msg relayed labs were stable. Pt is wanting to discuss if she needs to keep upcoming appt, and medications. Please call

## 2017-01-21 NOTE — Telephone Encounter (Signed)
LMVM for pt to return call.  If she calls back to let her know that would keep appt with Dr. Leta Baptist on 01-26-17 as scheduled, so to see how she is, go over test results, answer questions.

## 2017-01-24 NOTE — Telephone Encounter (Signed)
Patient called office and has been advised to keep appt with Dr. Leta Baptist on 01/31/17.  Confirmed appt with patient and she voiced understanding.

## 2017-01-31 ENCOUNTER — Ambulatory Visit (INDEPENDENT_AMBULATORY_CARE_PROVIDER_SITE_OTHER): Payer: 59 | Admitting: Diagnostic Neuroimaging

## 2017-01-31 ENCOUNTER — Encounter: Payer: Self-pay | Admitting: Diagnostic Neuroimaging

## 2017-01-31 VITALS — BP 164/108 | HR 98 | Wt 191.6 lb

## 2017-01-31 DIAGNOSIS — I10 Essential (primary) hypertension: Secondary | ICD-10-CM

## 2017-01-31 DIAGNOSIS — G7249 Other inflammatory and immune myopathies, not elsewhere classified: Secondary | ICD-10-CM

## 2017-01-31 DIAGNOSIS — E118 Type 2 diabetes mellitus with unspecified complications: Secondary | ICD-10-CM | POA: Diagnosis not present
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
## 2017-05-10 ENCOUNTER — Ambulatory Visit: Payer: 59 | Admitting: Diagnostic Neuroimaging

## 2017-05-16 ENCOUNTER — Ambulatory Visit: Payer: 59 | Admitting: Diagnostic Neuroimaging

## 2017-07-13 ENCOUNTER — Telehealth: Payer: Self-pay | Admitting: *Deleted

## 2017-07-13 ENCOUNTER — Encounter: Payer: Self-pay | Admitting: Diagnostic Neuroimaging

## 2017-07-13 ENCOUNTER — Ambulatory Visit (INDEPENDENT_AMBULATORY_CARE_PROVIDER_SITE_OTHER): Payer: 59 | Admitting: Diagnostic Neuroimaging

## 2017-07-13 VITALS — BP 158/101 | HR 78 | Ht 60.0 in | Wt 185.8 lb

## 2017-07-13 DIAGNOSIS — G7249 Other inflammatory and immune myopathies, not elsewhere classified: Secondary | ICD-10-CM | POA: Diagnosis not present

## 2017-07-13 DIAGNOSIS — E118 Type 2 diabetes mellitus with unspecified complications: Secondary | ICD-10-CM

## 2017-07-13 DIAGNOSIS — I10 Essential (primary) hypertension: Secondary | ICD-10-CM

## 2017-07-13 NOTE — Telephone Encounter (Addendum)
Pt [employer] form at the front desk for p/u
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
## 2017-07-14 LAB — CK: CK TOTAL: 243 U/L — AB (ref 24–173)

## 2017-07-14 LAB — CBC WITH DIFFERENTIAL/PLATELET
BASOS ABS: 0 10*3/uL (ref 0.0–0.2)
Basos: 0 %
EOS (ABSOLUTE): 0.1 10*3/uL (ref 0.0–0.4)
Eos: 1 %
HEMOGLOBIN: 12.2 g/dL (ref 11.1–15.9)
Hematocrit: 37.2 % (ref 34.0–46.6)
IMMATURE GRANS (ABS): 0.1 10*3/uL (ref 0.0–0.1)
Immature Granulocytes: 1 %
Lymphocytes Absolute: 5.6 10*3/uL — ABNORMAL HIGH (ref 0.7–3.1)
Lymphs: 57 %
MCH: 28.6 pg (ref 26.6–33.0)
MCHC: 32.8 g/dL (ref 31.5–35.7)
MCV: 87 fL (ref 79–97)
MONOCYTES: 6 %
Monocytes Absolute: 0.6 10*3/uL (ref 0.1–0.9)
NEUTROS PCT: 35 %
Neutrophils Absolute: 3.4 10*3/uL (ref 1.4–7.0)
Platelets: 462 10*3/uL — ABNORMAL HIGH (ref 150–379)
RBC: 4.27 x10E6/uL (ref 3.77–5.28)
RDW: 14.4 % (ref 12.3–15.4)
WBC: 9.7 10*3/uL (ref 3.4–10.8)

## 2017-07-14 LAB — COMPREHENSIVE METABOLIC PANEL
ALBUMIN: 4.2 g/dL (ref 3.5–5.5)
ALK PHOS: 42 IU/L (ref 39–117)
ALT: 19 IU/L (ref 0–32)
AST: 15 IU/L (ref 0–40)
Albumin/Globulin Ratio: 1.3 (ref 1.2–2.2)
BUN / CREAT RATIO: 27 — AB (ref 9–23)
BUN: 16 mg/dL (ref 6–24)
Bilirubin Total: 0.4 mg/dL (ref 0.0–1.2)
CHLORIDE: 94 mmol/L — AB (ref 96–106)
CO2: 31 mmol/L — AB (ref 20–29)
CREATININE: 0.59 mg/dL (ref 0.57–1.00)
Calcium: 9.8 mg/dL (ref 8.7–10.2)
GFR calc Af Amer: 124 mL/min/{1.73_m2} (ref >59)
GFR calc non Af Amer: 108 mL/min/{1.73_m2} (ref >59)
GLUCOSE: 132 mg/dL — AB (ref 65–99)
Globulin, Total: 3.2 g/dL (ref 1.5–4.5)
Potassium: 3.9 mmol/L (ref 3.5–5.2)
Sodium: 139 mmol/L (ref 134–144)
TOTAL PROTEIN: 7.4 g/dL (ref 6.0–8.5)

## 2017-07-14 LAB — ALDOLASE: Aldolase: 3.9 U/L (ref 3.3–10.3)

## 2017-07-14 LAB — HEMOGLOBIN A1C
ESTIMATED AVERAGE GLUCOSE: 157 mg/dL
HEMOGLOBIN A1C: 7.1 % — AB (ref 4.8–5.6)

## 2017-07-15 ENCOUNTER — Telehealth: Payer: Self-pay | Admitting: *Deleted

## 2017-07-15 NOTE — Telephone Encounter (Signed)
-----  Message from Penni Bombard, MD sent at 07/14/2017 10:42 AM EDT ----- Labs stable. CK slightly better. A1c better. Continue current plan. Please call patient. -VRP

## 2017-07-15 NOTE — Telephone Encounter (Signed)
LMVM for pt will speak to her on Monday with lab results.

## 2017-07-19 NOTE — Telephone Encounter (Signed)
LMVM for pt to return call for lab results and FMLA P/W.

## 2017-07-19 NOTE — Telephone Encounter (Signed)
-----  Message from Penni Bombard, MD sent at 07/14/2017 10:42 AM EDT ----- Labs stable. CK slightly better. A1c better. Continue current plan. Please call patient. -VRP

## 2017-07-19 NOTE — Telephone Encounter (Signed)
Form completed and signed and to MR.

## 2017-07-20 DIAGNOSIS — Z0289 Encounter for other administrative examinations: Secondary | ICD-10-CM

## 2017-07-27 NOTE — Telephone Encounter (Signed)
Spoke to pt and relayed results and completed FMLA p/w. (per previous note).

## 2018-01-05 DIAGNOSIS — E1165 Type 2 diabetes mellitus with hyperglycemia: Secondary | ICD-10-CM | POA: Diagnosis not present

## 2018-01-05 DIAGNOSIS — I10 Essential (primary) hypertension: Secondary | ICD-10-CM | POA: Diagnosis not present

## 2018-01-10 ENCOUNTER — Other Ambulatory Visit: Payer: Self-pay

## 2018-01-10 ENCOUNTER — Ambulatory Visit (HOSPITAL_COMMUNITY)
Admission: EM | Admit: 2018-01-10 | Discharge: 2018-01-10 | Disposition: A | Discharge status: Home / Self Care | Payer: 59 | Attending: Family Medicine | Admitting: Family Medicine

## 2018-01-10 ENCOUNTER — Encounter (HOSPITAL_COMMUNITY): Payer: Self-pay | Admitting: Physician Assistant

## 2018-01-10 DIAGNOSIS — M79674 Pain in right toe(s): Secondary | ICD-10-CM | POA: Diagnosis not present

## 2018-01-10 MED ORDER — NAPROXEN 500 MG PO TABS: 500.0000 mg | ORAL_TABLET | Freq: Two times a day (BID) | ORAL | 0 refills | Status: AC

## 2018-01-10 NOTE — Discharge Instructions (Signed)
Symptoms most likely due to inflammation/gout. Start naproxen as directed. Keep hydrated, your urine should be clear to pale yellow in color. Please monitor for spreading redness, fever, chills, follow up for reevaluation needed. Otherwise, follow up with your PCP for further evaluation and management needed.

## 2018-01-10 NOTE — ED Triage Notes (Signed)
Pt has toe pain right foot big toe no injury X  3 days
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
## 2018-02-24 ENCOUNTER — Other Ambulatory Visit (HOSPITAL_COMMUNITY)
Admission: RE | Admit: 2018-02-24 | Discharge: 2018-02-24 | Disposition: A | Discharge status: Home / Self Care | Payer: 59 | Source: Ambulatory Visit | Attending: Internal Medicine | Admitting: Internal Medicine

## 2018-02-24 ENCOUNTER — Ambulatory Visit (INDEPENDENT_AMBULATORY_CARE_PROVIDER_SITE_OTHER): Payer: 59 | Admitting: Nurse Practitioner

## 2018-02-24 ENCOUNTER — Encounter: Payer: Self-pay | Admitting: Nurse Practitioner

## 2018-02-24 VITALS — BP 138/88 | HR 80 | Temp 97.7°F | Ht <= 58 in | Wt 182.2 lb

## 2018-02-24 DIAGNOSIS — Z1211 Encounter for screening for malignant neoplasm of colon: Secondary | ICD-10-CM

## 2018-02-24 DIAGNOSIS — E119 Type 2 diabetes mellitus without complications: Secondary | ICD-10-CM

## 2018-02-24 DIAGNOSIS — Z124 Encounter for screening for malignant neoplasm of cervix: Secondary | ICD-10-CM | POA: Insufficient documentation

## 2018-02-24 DIAGNOSIS — I10 Essential (primary) hypertension: Secondary | ICD-10-CM | POA: Diagnosis not present

## 2018-02-24 DIAGNOSIS — Z0000 Encounter for general adult medical examination without abnormal findings: Secondary | ICD-10-CM

## 2018-02-24 LAB — POCT URINALYSIS DIPSTICK
Blood, UA: NEGATIVE
Glucose, UA: NEGATIVE
LEUKOCYTES UA: NEGATIVE
NITRITE UA: NEGATIVE
PH UA: 5 (ref 5.0–8.0)
PROTEIN UA: POSITIVE — AB
Spec Grav, UA: 1.025 (ref 1.010–1.025)
UROBILINOGEN UA: 0.2 U/dL

## 2018-02-24 LAB — POCT UA - MICROALBUMIN
Creatinine, POC: 300 mg/dL
Microalbumin Ur, POC: 150 mg/L

## 2018-02-24 NOTE — Progress Notes (Signed)
ult Subjective:     Patient ID: [name] , female    DOB: December 20, 1965 , 52 y.o.   MRN: [id number]   Chief Complaint  Patient presents with  . Annual Exam    HPI The patient states she uses none for birth control. Last LMP was No LMP recorded. (Menstrual status: Irregular Periods).. Negative for Dysmenorrhea and Negative for Menorrhagia Mammogram last done 06/2017 Negative for: breast discharge, breast lump(s), breast pain and breast self exam.  Pertinent negatives include abnormal bleeding (hematology), anxiety, decreased libido, depression, difficulty falling sleep, dyspareunia, history of infertility, nocturia, sexual dysfunction, sleep disturbances, urinary incontinence, urinary urgency, vaginal discharge and vaginal itching. Diet regular.The patient states her exercise level is  1-2 times week   . The patient's tobacco use is:  Social History   Tobacco Use  Smoking Status Never Smoker  Smokeless Tobacco Never Used  . She has been exposed to passive smoke. The patient's alcohol use is:  Social History   Substance and Sexual Activity  Alcohol Use No  . Additional information: Last pap 2016 next one scheduled for 2019.    Diabetes  She presents for her follow-up diabetic visit. She has type 2 diabetes mellitus. Her overall blood glucose range is 110-130 mg/dl.     Past Medical History:  Diagnosis Date  . Diabetes ([place])   . Hypertension   . Inflammatory myopathy    treated 2012  . Wears glasses    reading     Family History  Problem Relation Age of Onset  . Hypertension Mother   . Diabetes Mother   . Hypertension Father   . Heart disease Unknown   . Hypertension Unknown   . Diabetes Unknown      Current Outpatient Medications:  .  cholecalciferol (VITAMIN D) 1000 UNITS tablet, Take 1,000 Units by mouth daily. , Disp: , Rfl:  .  metFORMIN (GLUCOPHAGE) 1000 MG tablet, Take 1,000 mg by mouth daily with breakfast. , Disp: , Rfl:  .   valsartan-hydrochlorothiazide (DIOVAN-HCT) 160-25 MG per tablet, Take 1 tablet by mouth daily., Disp: , Rfl:    No Known Allergies   Review of Systems  Constitutional: Negative.   HENT: Negative.   Eyes: Negative.   Respiratory: Negative.   Cardiovascular: Negative.   Gastrointestinal: Negative.   Endocrine: Negative.   Genitourinary: Negative.   Musculoskeletal: Negative.   Skin: Negative.   Neurological: Negative.   Hematological: Negative.   Psychiatric/Behavioral: Negative.      Today's Vitals   02/24/18 1016  BP: 138/88  Pulse: 80  Temp: 97.7 F (36.5 C)  TempSrc: Oral  SpO2: 90%  Weight: 182 lb 3.2 oz (82.6 kg)  Height: 4' 9.5" (1.461 m)  PainSc: 0-No pain   Body mass index is 38.74 kg/m.   Objective:  Physical Exam  Constitutional: She is oriented to person, place, and time. She appears well-developed and well-nourished.  HENT:  Head: Normocephalic and atraumatic.  Right Ear: External ear normal.  Left Ear: External ear normal.  Nose: Nose normal.  Mouth/Throat: Oropharynx is clear and moist.  Eyes: Pupils are equal, round, and reactive to light. Conjunctivae and EOM are normal.  Neck: Normal range of motion. Neck supple.  Cardiovascular: Normal rate, regular rhythm and normal heart sounds.  Pulmonary/Chest: Effort normal and breath sounds normal.  Abdominal: Soft. Bowel sounds are normal.  Musculoskeletal: Normal range of motion.  Neurological: She is alert and oriented to person, place, and time.  Skin: Skin is warm  and dry. Capillary refill takes less than 2 seconds.  Psychiatric: She has a normal mood and affect.        Assessment And Plan:     1. Encounter for health maintenance examination  Behavior modifications discussed and diet history reviewed.    Pt will continue to exercise regularly and modify diet with low GI, plant based foods and decrease intake of processed foods.   Recommend intake of daily multivitamin, Vitamin D, and calcium.  Recommend mammogram (due January f/u) and colonoscopy for preventive screenings, as well as recommend immunizations that include influenza (declines) and TDAP (up to date)  Will check labs at next visit she is not due until December   2. Essential hypertension  Chronic, controlled  Continue with current medications - POCT Urinalysis Dipstick (81002) - POCT UA - Microalbumin  3. Type 2 diabetes mellitus without complication, without long-term current use of insulin (HCC)  Chronic, controlled  Continue with current medications, tolerating metformin well.  Encouraged to increase physical activity  4. Encounter for Papanicolaou smear of cervix  Normal gyn exam         Minette Brine, FNP

## 2018-02-24 NOTE — Patient Instructions (Signed)

## 2018-03-01 LAB — CYTOLOGY - PAP
ADEQUACY: ABSENT
Diagnosis: NEGATIVE

## 2018-03-06 NOTE — Addendum Note (Signed)
Addended by: Luana Shu on: 03/06/2018 04:55 PM   Modules accepted: Orders

## 2018-04-06 ENCOUNTER — Ambulatory Visit: Payer: 59 | Admitting: Nurse Practitioner

## 2018-04-13 LAB — HM COLONOSCOPY

## 2018-04-14 ENCOUNTER — Encounter: Payer: Self-pay | Admitting: Nurse Practitioner

## 2018-05-05 ENCOUNTER — Encounter: Payer: Self-pay | Admitting: Nurse Practitioner

## 2018-05-19 ENCOUNTER — Encounter: Payer: Self-pay | Admitting: Nurse Practitioner

## 2018-05-29 ENCOUNTER — Encounter: Payer: Self-pay | Admitting: Nurse Practitioner

## 2018-05-29 ENCOUNTER — Ambulatory Visit (INDEPENDENT_AMBULATORY_CARE_PROVIDER_SITE_OTHER): Payer: 59 | Admitting: Nurse Practitioner

## 2018-05-29 VITALS — BP 124/80 | HR 85 | Temp 98.2°F | Ht <= 58 in | Wt 183.2 lb

## 2018-05-29 DIAGNOSIS — E119 Type 2 diabetes mellitus without complications: Secondary | ICD-10-CM

## 2018-05-29 DIAGNOSIS — I10 Essential (primary) hypertension: Secondary | ICD-10-CM

## 2018-05-29 DIAGNOSIS — Z6838 Body mass index (BMI) 38.0-38.9, adult: Secondary | ICD-10-CM | POA: Diagnosis not present

## 2018-05-29 MED ORDER — VALSARTAN-HYDROCHLOROTHIAZIDE 160-25 MG PO TABS: 1.0000 | ORAL_TABLET | Freq: Every day | ORAL | 1 refills | Status: DC

## 2018-05-29 MED ORDER — SEMAGLUTIDE 7 MG PO TABS: 1.0000 | ORAL_TABLET | Freq: Every day | ORAL | 0 refills | Status: DC

## 2018-05-29 MED ORDER — METFORMIN HCL ER 750 MG PO TB24: 750.0000 mg | ORAL_TABLET | Freq: Every day | ORAL | 1 refills | Status: DC

## 2018-05-29 NOTE — Progress Notes (Signed)
Subjective:     Patient ID: Shannon [name] , female    DOB: 10-Jun-1965 , 53 y.o.   MRN: [id number]   Chief Complaint  Patient presents with  . Hypertension    HPI  Hypertension  This is a chronic problem. The current episode started more than 1 year ago. The problem is unchanged. The problem is controlled. Pertinent negatives include no anxiety, chest pain, headaches or palpitations. There are no associated agents to hypertension. Risk factors for coronary artery disease include sedentary lifestyle and obesity. Past treatments include angiotensin blockers and diuretics. There are no compliance problems.  There is no history of angina. There is no history of chronic renal disease.  Diabetes  Pertinent negatives for hypoglycemia include no confusion, dizziness, headaches or nervousness/anxiousness. Pertinent negatives for diabetes include no chest pain, no fatigue, no polydipsia, no polyphagia and no polyuria.     Past Medical History:  Diagnosis Date  . Diabetes ([place])   . Hypertension   . Inflammatory myopathy    treated 2012  . Wears glasses    reading     Family History  Problem Relation Age of Onset  . Hypertension Mother   . Diabetes Mother   . Hypertension Father   . Heart disease Unknown   . Hypertension Unknown   . Diabetes Unknown      Current Outpatient Medications:  .  cholecalciferol (VITAMIN D) 1000 UNITS tablet, Take 1,000 Units by mouth daily. , Disp: , Rfl:  .  metFORMIN (GLUCOPHAGE) 1000 MG tablet, Take 1,000 mg by mouth daily with breakfast. , Disp: , Rfl:  .  valsartan-hydrochlorothiazide (DIOVAN-HCT) 160-25 MG per tablet, Take 1 tablet by mouth daily., Disp: , Rfl:    No Known Allergies   Review of Systems  Constitutional: Negative.  Negative for fatigue.  Eyes: Negative for visual disturbance.  Respiratory: Negative.  Negative for cough.   Cardiovascular: Negative.  Negative for chest pain, palpitations and leg swelling.  Gastrointestinal:  Negative.   Endocrine: Negative.  Negative for polydipsia, polyphagia and polyuria.  Skin: Negative.   Neurological: Negative.  Negative for dizziness and headaches.  Psychiatric/Behavioral: Negative for confusion. The patient is not nervous/anxious.      Today's Vitals   05/29/18 1429  BP: 124/80  Pulse: 85  Temp: 98.2 F (36.8 C)  TempSrc: Oral  Weight: 183 lb 3.2 oz (83.1 kg)  Height: 4' 9.5" (1.461 m)  PainSc: 0-No pain   Body mass index is 38.96 kg/m.   Objective:  Physical Exam Vitals signs reviewed.  Constitutional:      Appearance: She is well-developed.  HENT:     Head: Normocephalic and atraumatic.  Eyes:     Pupils: Pupils are equal, round, and reactive to light.  Neck:     Musculoskeletal: Normal range of motion and neck supple.  Cardiovascular:     Rate and Rhythm: Normal rate and regular rhythm.     Pulses: Normal pulses.     Heart sounds: Normal heart sounds. No murmur.  Pulmonary:     Effort: Pulmonary effort is normal.     Breath sounds: Normal breath sounds.  Chest:     Chest wall: No tenderness.  Musculoskeletal: Normal range of motion.  Skin:    General: Skin is warm and dry.     Capillary Refill: Capillary refill takes less than 2 seconds.  Neurological:     General: No focal deficit present.     Mental Status: She is alert and oriented  to person, place, and time.     Cranial Nerves: No cranial nerve deficit.  Psychiatric:        Mood and Affect: Mood normal.         Assessment And Plan:     1. Type 2 diabetes mellitus without complication, without long-term current use of insulin (HCC)  Chronic, fair control  Continue with current medications  Encouraged to limit intake of sugary foods and drinks  Encouraged to increase physical activity to 150 minutes per week  I am starting her on rybelsus, discussed side effect profile and denies a family history of thyroid cancer. - Hemoglobin A1c - BMP8+eGFR - metFORMIN (GLUCOPHAGE-XR)  750 MG 24 hr tablet; Take 1 tablet (750 mg total) by mouth daily with breakfast.  Dispense: 90 tablet; Refill: 1 - Semaglutide (RYBELSUS) 7 MG TABS; Take 1 tablet by mouth daily.  Dispense: 30 tablet; Refill: 0 - valsartan-hydrochlorothiazide (DIOVAN-HCT) 160-25 MG tablet; Take 1 tablet by mouth daily.  Dispense: 90 tablet; Refill: 1  2. Essential hypertension . B/P is controlled.  . CMP ordered to check renal function.  . The importance of regular exercise and dietary modification was stressed to the patient.  . Stressed importance of losing ten percent of her body weight to help with B/P control.  . The weight loss would help with decreasing cardiac and cancer risk as well.   3. Class 2 severe obesity due to excess calories with serious comorbidity and body mass index (BMI) of 38.0 to 38.9 in adult [facility]).  Chronic  Discussed healthy diet and regular exercise options   Encouraged to exercise at least 150 minutes per week with 2 days of strength training   Minette Brine, FNP

## 2018-05-30 LAB — HEMOGLOBIN A1C
Est. average glucose Bld gHb Est-mCnc: 163 mg/dL
HEMOGLOBIN A1C: 7.3 % — AB (ref 4.8–5.6)

## 2018-05-30 LAB — BMP8+EGFR
BUN/Creatinine Ratio: 20 (ref 9–23)
BUN: 13 mg/dL (ref 6–24)
CALCIUM: 10.4 mg/dL — AB (ref 8.7–10.2)
CO2: 28 mmol/L (ref 20–29)
CREATININE: 0.64 mg/dL (ref 0.57–1.00)
Chloride: 95 mmol/L — ABNORMAL LOW (ref 96–106)
GFR calc Af Amer: 120 mL/min/{1.73_m2} (ref >59)
GFR, EST NON AFRICAN AMERICAN: 104 mL/min/{1.73_m2} (ref >59)
Glucose: 132 mg/dL — ABNORMAL HIGH (ref 65–99)
POTASSIUM: 4.6 mmol/L (ref 3.5–5.2)
Sodium: 138 mmol/L (ref 134–144)

## 2018-06-13 ENCOUNTER — Encounter: Payer: Self-pay | Admitting: Nurse Practitioner

## 2018-06-14 LAB — HM DIABETES EYE EXAM

## 2018-07-04 ENCOUNTER — Encounter: Payer: Self-pay | Admitting: Nurse Practitioner

## 2018-08-15 ENCOUNTER — Telehealth: Payer: Self-pay

## 2018-08-15 NOTE — Telephone Encounter (Signed)
LVM for pt to contact office to reschedule OV appt for either a morning or virtual visit 08/15/18

## 2018-08-25 ENCOUNTER — Encounter: Payer: Self-pay | Admitting: Nurse Practitioner

## 2018-08-28 ENCOUNTER — Ambulatory Visit: Payer: 59 | Admitting: Nurse Practitioner

## 2018-09-11 ENCOUNTER — Other Ambulatory Visit: Payer: Self-pay

## 2018-09-11 MED ORDER — HYDROCHLOROTHIAZIDE 25 MG PO TABS: 25.0000 mg | ORAL_TABLET | Freq: Every day | ORAL | 0 refills | Status: DC

## 2018-09-11 MED ORDER — VALSARTAN 160 MG PO TABS: 160.0000 mg | ORAL_TABLET | Freq: Every day | ORAL | 0 refills | Status: DC

## 2018-09-19 ENCOUNTER — Other Ambulatory Visit: Payer: Self-pay | Admitting: Nurse Practitioner

## 2018-09-19 ENCOUNTER — Encounter: Payer: Self-pay | Admitting: Nurse Practitioner

## 2018-11-07 ENCOUNTER — Ambulatory Visit (INDEPENDENT_AMBULATORY_CARE_PROVIDER_SITE_OTHER): Payer: 59 | Admitting: Nurse Practitioner

## 2018-11-07 ENCOUNTER — Encounter: Payer: Self-pay | Admitting: Nurse Practitioner

## 2018-11-07 ENCOUNTER — Other Ambulatory Visit: Payer: Self-pay

## 2018-11-07 VITALS — BP 130/86 | HR 82 | Temp 97.5°F | Ht 60.4 in | Wt 190.0 lb

## 2018-11-07 DIAGNOSIS — Z6838 Body mass index (BMI) 38.0-38.9, adult: Secondary | ICD-10-CM | POA: Diagnosis not present

## 2018-11-07 DIAGNOSIS — E118 Type 2 diabetes mellitus with unspecified complications: Secondary | ICD-10-CM

## 2018-11-07 DIAGNOSIS — I10 Essential (primary) hypertension: Secondary | ICD-10-CM | POA: Diagnosis not present

## 2018-11-07 MED ORDER — ATORVASTATIN CALCIUM 10 MG PO TABS: 10.0000 mg | ORAL_TABLET | Freq: Every day | ORAL | 11 refills | Status: DC

## 2018-11-07 MED ORDER — METFORMIN HCL 500 MG PO TABS: 500.0000 mg | ORAL_TABLET | Freq: Two times a day (BID) | ORAL | 11 refills | Status: DC

## 2018-11-07 MED ORDER — OZEMPIC (0.25 OR 0.5 MG/DOSE) 2 MG/1.5ML ~~LOC~~ SOPN: 0.5000 mg | PEN_INJECTOR | SUBCUTANEOUS | 3 refills | Status: DC

## 2018-11-07 NOTE — Progress Notes (Signed)
Subjective:     Patient ID: Shannon [name] , female    DOB: [birth year] , 53 y.o.   MRN: [id number]   Chief Complaint  Patient presents with  . Diabetes    HPI  Diabetes She presents for her follow-up diabetic visit. She has type 2 diabetes mellitus. Her disease course has been stable. There are no hypoglycemic associated symptoms. Pertinent negatives for hypoglycemia include no dizziness or headaches. Pertinent negatives for diabetes include no blurred vision, no fatigue, no polydipsia, no polyphagia and no polyuria. There are no hypoglycemic complications. There are no diabetic complications. Risk factors for coronary artery disease include diabetes mellitus, obesity and sedentary lifestyle. Current diabetic treatment includes oral agent (dual therapy) (metformin XR 738m and rybelsus). She is compliant with treatment some of the time. She is following a diabetic diet. When asked about meal planning, she reported none. She has not had a previous visit with a dietitian. She rarely (walking two times a week - 10 - 12 minutes ) participates in exercise. There is no change in her home blood glucose trend. (Average 125 when she checks 1-2 times a week.) An ACE inhibitor/angiotensin II receptor blocker is being taken. She does not see a podiatrist.Eye exam is not current.     Past Medical History:  Diagnosis Date  . [place]   . Hypertension   . Inflammatory myopathy    treated 2012  . Wears glasses    reading     Family History  Problem Relation Age of Onset  . Hypertension Mother   . Diabetes Mother   . Hypertension Father   . Heart disease Unknown   . Hypertension Unknown   . Diabetes Unknown      Current Outpatient Medications:  .  cholecalciferol (VITAMIN D) 1000 UNITS tablet, Take 1,000 Units by mouth daily. , Disp: , Rfl:  .  metFORMIN (GLUCOPHAGE-XR) 750 MG 24 hr tablet, Take 1 tablet (750 mg total) by mouth daily with breakfast., Disp: 90 tablet, Rfl: 1 .   valsartan-hydrochlorothiazide (DIOVAN-HCT) 160-25 MG tablet, Take 1 tablet by mouth daily., Disp: 90 tablet, Rfl: 1 .  Semaglutide (RYBELSUS) 7 MG TABS, Take 1 tablet by mouth daily. (Patient not taking: Reported on 11/07/2018), Disp: 30 tablet, Rfl: 0   No Known Allergies   Review of Systems  Constitutional: Negative for chills and fatigue.  Eyes: Negative for blurred vision.  Respiratory: Negative.   Cardiovascular: Negative.   Endocrine: Negative for polydipsia, polyphagia and polyuria.  Skin: Negative.   Neurological: Negative for dizziness and headaches.     Today's Vitals   11/07/18 0928  BP: 130/86  Pulse: 82  Temp: (!) 97.5 F (36.4 C)  TempSrc: Oral  Weight: 190 lb (86.2 kg)  Height: 5' 0.4" (1.534 m)  PainSc: 0-No pain   Body mass index is 36.62 kg/m.   Objective:  Physical Exam Vitals signs reviewed.  Constitutional:      General: She is not in acute distress.    Appearance: Normal appearance. She is well-developed. She is obese.  Eyes:     Pupils: Pupils are equal, round, and reactive to light.  Neck:     Musculoskeletal: Normal range of motion and neck supple.  Cardiovascular:     Rate and Rhythm: Normal rate and regular rhythm.     Pulses: Normal pulses.     Heart sounds: Normal heart sounds. No murmur.  Pulmonary:     Effort: Pulmonary effort is normal.  Breath sounds: Normal breath sounds.  Chest:     Chest wall: No tenderness.  Musculoskeletal: Normal range of motion.  Skin:    General: Skin is warm and dry.     Capillary Refill: Capillary refill takes less than 2 seconds.  Neurological:     General: No focal deficit present.     Mental Status: She is alert and oriented to person, place, and time.     Cranial Nerves: No cranial nerve deficit.  Psychiatric:        Mood and Affect: Mood normal.         Assessment And Plan:     1. Essential hypertension . B/P is fairly controlled.  . No labs this visit  2. Class 2 severe obesity due  to excess calories with serious comorbidity and body mass index (BMI) of 38.0 to 38.9 in adult [facility])  Chronic  Discussed healthy diet and regular exercise options   Encouraged to exercise at least 150 minutes per week with 2 days of strength training  3. Type 2 diabetes mellitus with complication, without long-term current use of insulin (HCC)  Chronic,  She feels the rybelsus is causing side effects.    I am concerned about her HgbA1c going up and feel she would benefit from an GLP1, we will try Ozempic to help with adherence.  Discussed side effects to include nausea, if she has throat problems or stomach pain she is to return call to office.   - atorvastatin (LIPITOR) 10 MG tablet; Take 1 tablet (10 mg total) by mouth daily.  Dispense: 30 tablet; Refill: 11 - Hemoglobin A1c - CMP14 + Anion Gap - metFORMIN (GLUCOPHAGE) 500 MG tablet; Take 1 tablet (500 mg total) by mouth 2 (two) times daily with a meal.  Dispense: 60 tablet; Refill: 11 - Semaglutide,0.25 or 0.5MG/DOS, (OZEMPIC, 0.25 OR 0.5 MG/DOSE,) 2 MG/1.5ML SOPN; Inject 0.5 mg into the skin once a week.  Dispense: 1 pen; Refill: 3   Minette Brine, FNP    THE PATIENT IS ENCOURAGED TO PRACTICE SOCIAL DISTANCING DUE TO THE COVID-19 PANDEMIC.

## 2018-11-08 LAB — CMP14 + ANION GAP
ALT: 23 IU/L (ref 0–32)
AST: 15 IU/L (ref 0–40)
Albumin/Globulin Ratio: 1.5 (ref 1.2–2.2)
Albumin: 4.3 g/dL (ref 3.8–4.8)
Alkaline Phosphatase: 43 IU/L (ref 39–117)
Anion Gap: 15 mmol/L (ref 10.0–18.0)
BUN/Creatinine Ratio: 26 — ABNORMAL HIGH (ref 9–23)
BUN: 15 mg/dL (ref 6–24)
Bilirubin Total: 0.8 mg/dL (ref 0.0–1.2)
CO2: 26 mmol/L (ref 20–29)
Calcium: 9.9 mg/dL (ref 8.7–10.2)
Chloride: 96 mmol/L (ref 96–106)
Creatinine, Ser: 0.57 mg/dL (ref 0.57–1.00)
GFR calc Af Amer: 125 mL/min/{1.73_m2} (ref >59)
GFR calc non Af Amer: 108 mL/min/{1.73_m2} (ref >59)
Globulin, Total: 2.9 g/dL (ref 1.5–4.5)
Glucose: 168 mg/dL — ABNORMAL HIGH (ref 65–99)
Potassium: 4.2 mmol/L (ref 3.5–5.2)
Sodium: 137 mmol/L (ref 134–144)
Total Protein: 7.2 g/dL (ref 6.0–8.5)

## 2018-11-08 LAB — HEMOGLOBIN A1C
Est. average glucose Bld gHb Est-mCnc: 194 mg/dL
Hgb A1c MFr Bld: 8.4 % — ABNORMAL HIGH (ref 4.8–5.6)

## 2018-11-13 ENCOUNTER — Telehealth: Payer: Self-pay

## 2018-11-13 NOTE — Telephone Encounter (Signed)
Patient returned my call and was notified of her labs. YRL,RMA

## 2018-11-21 LAB — HM MAMMOGRAPHY: HM Mammogram: NORMAL (ref 0–4)

## 2018-11-22 ENCOUNTER — Encounter: Payer: Self-pay | Admitting: Nurse Practitioner

## 2018-12-06 ENCOUNTER — Other Ambulatory Visit: Payer: Self-pay | Admitting: Nurse Practitioner

## 2018-12-07 ENCOUNTER — Other Ambulatory Visit: Payer: Self-pay | Admitting: Nurse Practitioner

## 2018-12-07 DIAGNOSIS — E119 Type 2 diabetes mellitus without complications: Secondary | ICD-10-CM

## 2018-12-25 ENCOUNTER — Other Ambulatory Visit: Payer: Self-pay | Admitting: Nurse Practitioner

## 2018-12-25 DIAGNOSIS — E119 Type 2 diabetes mellitus without complications: Secondary | ICD-10-CM

## 2019-02-05 ENCOUNTER — Other Ambulatory Visit: Payer: Self-pay

## 2019-02-05 ENCOUNTER — Encounter: Payer: Self-pay | Admitting: Nurse Practitioner

## 2019-02-05 ENCOUNTER — Telehealth (INDEPENDENT_AMBULATORY_CARE_PROVIDER_SITE_OTHER): Payer: 59 | Admitting: Nurse Practitioner

## 2019-02-05 VITALS — Temp 97.4°F | Ht 60.0 in | Wt 184.0 lb

## 2019-02-05 DIAGNOSIS — Z20828 Contact with and (suspected) exposure to other viral communicable diseases: Secondary | ICD-10-CM | POA: Diagnosis not present

## 2019-02-05 DIAGNOSIS — R197 Diarrhea, unspecified: Secondary | ICD-10-CM | POA: Diagnosis not present
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
## 2019-02-06 ENCOUNTER — Other Ambulatory Visit: Payer: Self-pay

## 2019-02-06 DIAGNOSIS — Z20822 Contact with and (suspected) exposure to covid-19: Secondary | ICD-10-CM

## 2019-02-08 LAB — NOVEL CORONAVIRUS, NAA: SARS-CoV-2, NAA: NOT DETECTED

## 2019-02-13 ENCOUNTER — Telehealth: Payer: Self-pay

## 2019-02-13 NOTE — Telephone Encounter (Signed)
I left patient a v/m notifying her that her forms are completed and ready to be picked up. YRL,RMA

## 2019-02-28 ENCOUNTER — Ambulatory Visit (INDEPENDENT_AMBULATORY_CARE_PROVIDER_SITE_OTHER): Payer: 59 | Admitting: Nurse Practitioner

## 2019-02-28 ENCOUNTER — Other Ambulatory Visit: Payer: Self-pay

## 2019-02-28 ENCOUNTER — Encounter: Payer: Self-pay | Admitting: Nurse Practitioner

## 2019-02-28 VITALS — BP 140/76 | HR 86 | Temp 97.7°F | Ht 60.6 in | Wt 183.6 lb

## 2019-02-28 DIAGNOSIS — Z23 Encounter for immunization: Secondary | ICD-10-CM | POA: Diagnosis not present

## 2019-02-28 DIAGNOSIS — Z0000 Encounter for general adult medical examination without abnormal findings: Secondary | ICD-10-CM

## 2019-02-28 DIAGNOSIS — I10 Essential (primary) hypertension: Secondary | ICD-10-CM

## 2019-02-28 DIAGNOSIS — G4700 Insomnia, unspecified: Secondary | ICD-10-CM

## 2019-02-28 DIAGNOSIS — Z1211 Encounter for screening for malignant neoplasm of colon: Secondary | ICD-10-CM

## 2019-02-28 DIAGNOSIS — E118 Type 2 diabetes mellitus with unspecified complications: Secondary | ICD-10-CM

## 2019-02-28 DIAGNOSIS — Z124 Encounter for screening for malignant neoplasm of cervix: Secondary | ICD-10-CM | POA: Diagnosis not present

## 2019-02-28 LAB — POCT URINALYSIS DIPSTICK
Bilirubin, UA: NEGATIVE
Blood, UA: NEGATIVE
Glucose, UA: POSITIVE — AB
Ketones, UA: NEGATIVE
Leukocytes, UA: NEGATIVE
Nitrite, UA: NEGATIVE
Protein, UA: NEGATIVE
Spec Grav, UA: 1.025 (ref 1.010–1.025)
Urobilinogen, UA: 0.2 E.U./dL
pH, UA: 5 (ref 5.0–8.0)

## 2019-02-28 LAB — POCT UA - MICROALBUMIN
Albumin/Creatinine Ratio, Urine, POC: 30
Creatinine, POC: 200 mg/dL
Microalbumin Ur, POC: 30 mg/L

## 2019-02-28 NOTE — Patient Instructions (Addendum)
Health Maintenance  Topic Date Due  . HIV Screening  02/28/2020 (Originally 03/06/1983)  . HEMOGLOBIN A1C  05/10/2019  . OPHTHALMOLOGY EXAM  06/15/2019  . FOOT EXAM  02/28/2020  . MAMMOGRAM  11/20/2020  . PAP SMEAR-Modifier  02/24/2021  . TETANUS/TDAP  06/06/2021  . COLONOSCOPY  04/13/2028  . INFLUENZA VACCINE  Completed  . PNEUMOCOCCAL POLYSACCHARIDE VACCINE AGE 51-64 HIGH RISK  Completed   Health Maintenance, Female Adopting a healthy lifestyle and getting preventive care are important in promoting health and wellness. Ask your health care provider about:  The right schedule for you to have regular tests and exams.  Things you can do on your own to prevent diseases and keep yourself healthy. What should I know about diet, weight, and exercise? Eat a healthy diet   Eat a diet that includes plenty of vegetables, fruits, low-fat dairy products, and lean protein.  Do not eat a lot of foods that are high in solid fats, added sugars, or sodium. Maintain a healthy weight Body mass index (BMI) is used to identify weight problems. It estimates body fat based on height and weight. Your health care provider can help determine your BMI and help you achieve or maintain a healthy weight. Get regular exercise Get regular exercise. This is one of the most important things you can do for your health. Most adults should:  Exercise for at least 150 minutes each week. The exercise should increase your heart rate and make you sweat (moderate-intensity exercise).  Do strengthening exercises at least twice a week. This is in addition to the moderate-intensity exercise.  Spend less time sitting. Even light physical activity can be beneficial. Watch cholesterol and blood lipids Have your blood tested for lipids and cholesterol at 51 years of age, then have this test every 5 years. Have your cholesterol levels checked more often if:  Your lipid or cholesterol levels are high.  You are older than 51  years of age.  You are at high risk for heart disease. What should I know about cancer screening? Depending on your health history and family history, you may need to have cancer screening at various ages. This may include screening for:  Breast cancer.  Cervical cancer.  Colorectal cancer.  Skin cancer.  Lung cancer. What should I know about heart disease, diabetes, and high blood pressure? Blood pressure and heart disease  High blood pressure causes heart disease and increases the risk of stroke. This is more likely to develop in people who have high blood pressure readings, are of African descent, or are overweight.  Have your blood pressure checked: ? Every 3-5 years if you are 49-71 years of age. ? Every year if you are 77 years old or older. Diabetes Have regular diabetes screenings. This checks your fasting blood sugar level. Have the screening done:  Once every three years after age 57 if you are at a normal weight and have a low risk for diabetes.  More often and at a younger age if you are overweight or have a high risk for diabetes. What should I know about preventing infection? Hepatitis B If you have a higher risk for hepatitis B, you should be screened for this virus. Talk with your health care provider to find out if you are at risk for hepatitis B infection. Hepatitis C Testing is recommended for:  Everyone born from [birth year] through 1965.  Anyone with known risk factors for hepatitis C. Sexually transmitted infections (STIs)  Get screened for  STIs, including gonorrhea and chlamydia, if: ? You are sexually active and are younger than 51 years of age. ? You are older than 51 years of age and your health care provider tells you that you are at risk for this type of infection. ? Your sexual activity has changed since you were last screened, and you are at increased risk for chlamydia or gonorrhea. Ask your health care provider if you are at risk.  Ask your health  care provider about whether you are at high risk for HIV. Your health care provider may recommend a prescription medicine to help prevent HIV infection. If you choose to take medicine to prevent HIV, you should first get tested for HIV. You should then be tested every 3 months for as long as you are taking the medicine. Pregnancy  If you are about to stop having your period (premenopausal) and you may become pregnant, seek counseling before you get pregnant.  Take 400 to 800 micrograms (mcg) of folic acid every day if you become pregnant.  Ask for birth control (contraception) if you want to prevent pregnancy. Osteoporosis and menopause Osteoporosis is a disease in which the bones lose minerals and strength with aging. This can result in bone fractures. If you are 60 years old or older, or if you are at risk for osteoporosis and fractures, ask your health care provider if you should:  Be screened for bone loss.  Take a calcium or vitamin D supplement to lower your risk of fractures.  Be given hormone replacement therapy (HRT) to treat symptoms of menopause. Follow these instructions at home: Lifestyle  Do not use any products that contain nicotine or tobacco, such as cigarettes, e-cigarettes, and chewing tobacco. If you need help quitting, ask your health care provider.  Do not use street drugs.  Do not share needles.  Ask your health care provider for help if you need support or information about quitting drugs. Alcohol use  Do not drink alcohol if: ? Your health care provider tells you not to drink. ? You are pregnant, may be pregnant, or are planning to become pregnant.  If you drink alcohol: ? Limit how much you use to 0-1 drink a day. ? Limit intake if you are breastfeeding.  Be aware of how much alcohol is in your drink. In the [place] one drink equals one 12 oz bottle of beer (355 mL), one 5 oz glass of wine (148 mL), or one 1 oz glass of hard liquor (44 mL). General  instructions  Schedule regular health, dental, and eye exams.  Stay current with your vaccines.  Tell your health care provider if: ? You often feel depressed. ? You have ever been abused or do not feel safe at home. Summary  Adopting a healthy lifestyle and getting preventive care are important in promoting health and wellness.  Follow your health care provider's instructions about healthy diet, exercising, and getting tested or screened for diseases.  Follow your health care provider's instructions on monitoring your cholesterol and blood pressure. This information is not intended to replace advice given to you by your health care provider. Make sure you discuss any questions you have with your health care provider. Document Released: 10/26/2010 Document Revised: 04/05/2018 Document Reviewed: 04/05/2018 Elsevier Patient Education  [place].   Take magnesium 245m with evening meal nightly

## 2019-02-28 NOTE — Progress Notes (Addendum)
Subjective:     Patient ID: Shannon [name] , female    DOB: Dec 16, 1965 , 53 y.o.   MRN: [id number]   Chief Complaint  Patient presents with  . Annual Exam    HPI The patient states she uses none for birth control. Last LMP July 2020.Negative for Dysmenorrhea and Negative for Menorrhagia Mammogram last done 11/21/2018.  Negative for: breast discharge, breast lump(s), breast pain and breast self exam.  Pertinent negatives include abnormal bleeding (hematology), anxiety, decreased libido, depression, difficulty falling sleep, dyspareunia, history of infertility, nocturia, sexual dysfunction, sleep disturbances, urinary incontinence, urinary urgency, vaginal discharge and vaginal itching. Diet regular, has cut back on eating out alot.The patient states her exercise level is moderate  walking 2-3 times a week as a family     The patient's tobacco use is:  Social History   Tobacco Use  Smoking Status Never Smoker  Smokeless Tobacco Never Used   She has been exposed to passive smoke. The patient's alcohol use is:  Social History   Substance and Sexual Activity  Alcohol Use No   Additional information: Last pap 2016 next one scheduled for 2019.   Here for HM  Wt Readings from Last 3 Encounters: 02/28/19 : 183 lb 9.6 oz (83.3 kg) 02/05/19 : 184 lb (83.5 kg) 11/07/18 : 190 lb (86.2 kg)   Diabetes She presents for her follow-up diabetic visit. She has type 2 diabetes mellitus. Her disease course has been stable. There are no hypoglycemic associated symptoms. There are no diabetic associated symptoms. There are no hypoglycemic complications. Symptoms are stable. There are no diabetic complications. Risk factors for coronary artery disease include obesity, sedentary lifestyle and diabetes mellitus. Current diabetic treatment includes oral agent (dual therapy). She is compliant with treatment most of the time. She is following a generally unhealthy diet. When asked about meal planning, she  reported none. She has not had a previous visit with a dietitian. She participates in exercise every other day. There is no change in her home blood glucose trend. Her overall blood glucose range is 110-130 mg/dl. (Blood sugars are averaging low 100's) An ACE inhibitor/angiotensin II receptor blocker is not being taken. She does not see a podiatrist.Eye exam is current.  Insomnia Primary symptoms: frequent awakening.  The current episode started more than one month. The onset quality is sudden. The problem occurs every several days.     Past Medical History:  Diagnosis Date  . Diabetes ([place])   . Hypertension   . Inflammatory myopathy    treated 2012  . Wears glasses    reading     Family History  Problem Relation Age of Onset  . Hypertension Mother   . Diabetes Mother   . Hypertension Father   . Heart disease Unknown   . Hypertension Unknown   . Diabetes Unknown      Current Outpatient Medications:  .  atorvastatin (LIPITOR) 10 MG tablet, Take 1 tablet (10 mg total) by mouth daily., Disp: 30 tablet, Rfl: 11 .  cholecalciferol (VITAMIN D) 1000 UNITS tablet, Take 1,000 Units by mouth daily. , Disp: , Rfl:  .  metFORMIN (GLUCOPHAGE) 500 MG tablet, Take 1 tablet (500 mg total) by mouth 2 (two) times daily with a meal., Disp: 60 tablet, Rfl: 11 .  Semaglutide,0.25 or 0.5MG/DOS, (OZEMPIC, 0.25 OR 0.5 MG/DOSE,) 2 MG/1.5ML SOPN, Inject 0.5 mg into the skin once a week., Disp: 1 pen, Rfl: 3 .  valsartan-hydrochlorothiazide (DIOVAN-HCT) 160-25 MG tablet, TAKE 1 TABLET  BY MOUTH EVERY DAY, Disp: 90 tablet, Rfl: 1   No Known Allergies   Review of Systems  Constitutional: Negative.   HENT: Negative.   Eyes: Negative.   Respiratory: Negative.   Cardiovascular: Negative.   Gastrointestinal: Negative.   Endocrine: Negative.   Genitourinary: Negative.   Musculoskeletal: Negative.   Skin: Negative.   Neurological: Negative.   Hematological: Negative.   Psychiatric/Behavioral: The  patient has insomnia.      There were no vitals filed for this visit. There is no height or weight on file to calculate BMI.   Objective:  Physical Exam Constitutional:      Appearance: She is well-developed.  HENT:     Head: Normocephalic and atraumatic.     Right Ear: External ear normal.     Left Ear: External ear normal.     Nose: Nose normal.  Eyes:     Conjunctiva/sclera: Conjunctivae normal.     Pupils: Pupils are equal, round, and reactive to light.  Neck:     Musculoskeletal: Normal range of motion and neck supple.  Cardiovascular:     Rate and Rhythm: Normal rate and regular rhythm.     Heart sounds: Normal heart sounds.  Pulmonary:     Effort: Pulmonary effort is normal.     Breath sounds: Normal breath sounds.  Abdominal:     General: Bowel sounds are normal.     Palpations: Abdomen is soft.  Genitourinary:    Labia:        Right: No tenderness.        Left: No tenderness.      Vagina: Normal.     Cervix: Normal.     Uterus: Normal.      Adnexa:        Right: No mass or tenderness.         Left: No mass or tenderness.    Musculoskeletal: Normal range of motion.  Skin:    General: Skin is warm and dry.     Capillary Refill: Capillary refill takes less than 2 seconds.  Neurological:     Mental Status: She is alert and oriented to person, place, and time.         Assessment And Plan:     1. Need for influenza vaccination  Influenza vaccine given in office  Advised to take Tylenol as needed for muscle aches or fever - Flu Vaccine QUAD 6+ mos PF IM (Fluarix Quad PF)  2. Encounter for annual health examination  Behavior modifications discussed and diet history reviewed.    Pt will continue to exercise regularly and modify diet with low GI, plant based foods and decrease intake of processed foods.   Recommend intake of daily multivitamin, Vitamin D, and calcium. Recommend mammogram and colonoscopy for preventive screenings, as well as recommend  immunizations that include influenza and TDAP (up to date)  Will check labs at next visit she is not due until December - POCT Urinalysis Dipstick (81002) - POCT UA - Microalbumin - EKG 12-Lead - VITAMIN D 25 Hydroxy (Vit-D Deficiency, Fractures)  3. Essential hypertension  Chronic, controlled  Continue with current medications  EKG done with NSR HR 74 - POCT Urinalysis Dipstick (81002) - POCT UA - Microalbumin - EKG 12-Lead - BMP8+eGFR  4. Type 2 diabetes mellitus with complication, without long-term current use of insulin (HCC)  Chronic, controlled  Continue with current medications, tolerating metformin well.  Encouraged to increase physical activity - POCT Urinalysis Dipstick (54270) - POCT  UA - Microalbumin - EKG 12-Lead - BMP8+eGFR - Lipid panel - Hemoglobin A1c  5. Encounter for screening colonoscopy  According to USPTF Colorectal cancer Screening guidelines. Colonoscopy is recommended every 10 years, starting at age 65years.  Will refer to GI for colon cancer screening. - Ambulatory referral to Gastroenterology  6. Class 2 severe obesity due to excess calories with serious comorbidity and body mass index (BMI) of 38.0 to 38.9 in adult [facility])  Chronic  Discussed healthy diet and regular exercise options   Encouraged to exercise at least 150 minutes per week with 2 days of strength training  7. Encounter for immunization  Pneumonia 23 given in office - Pneumococcal polysaccharide vaccine 23-valent greater than or equal to 2yo subcutaneous/IM       Minette Brine, FNP

## 2019-03-01 LAB — BMP8+EGFR
BUN/Creatinine Ratio: 25 — ABNORMAL HIGH (ref 9–23)
BUN: 16 mg/dL (ref 6–24)
CO2: 28 mmol/L (ref 20–29)
Calcium: 10.1 mg/dL (ref 8.7–10.2)
Chloride: 90 mmol/L — ABNORMAL LOW (ref 96–106)
Creatinine, Ser: 0.65 mg/dL (ref 0.57–1.00)
GFR calc Af Amer: 120 mL/min/{1.73_m2} (ref >59)
GFR calc non Af Amer: 104 mL/min/{1.73_m2} (ref >59)
Glucose: 181 mg/dL — ABNORMAL HIGH (ref 65–99)
Potassium: 3.8 mmol/L (ref 3.5–5.2)
Sodium: 136 mmol/L (ref 134–144)

## 2019-03-01 LAB — LIPID PANEL
Chol/HDL Ratio: 4.7 ratio — ABNORMAL HIGH (ref 0.0–4.4)
Cholesterol, Total: 223 mg/dL — ABNORMAL HIGH (ref 100–199)
HDL: 47 mg/dL (ref >39)
LDL Chol Calc (NIH): 143 mg/dL — ABNORMAL HIGH (ref 0–99)
Triglycerides: 186 mg/dL — ABNORMAL HIGH (ref 0–149)
VLDL Cholesterol Cal: 33 mg/dL (ref 5–40)

## 2019-03-01 LAB — HEMOGLOBIN A1C
Est. average glucose Bld gHb Est-mCnc: 171 mg/dL
Hgb A1c MFr Bld: 7.6 % — ABNORMAL HIGH (ref 4.8–5.6)

## 2019-03-01 LAB — VITAMIN D 25 HYDROXY (VIT D DEFICIENCY, FRACTURES): Vit D, 25-Hydroxy: 57.6 ng/mL (ref 30.0–100.0)

## 2019-03-26 ENCOUNTER — Encounter: Payer: Self-pay | Admitting: Nurse Practitioner

## 2019-03-30 ENCOUNTER — Encounter: Payer: Self-pay | Admitting: Nurse Practitioner

## 2019-04-17 NOTE — Progress Notes (Signed)
Call patient with results I don't think she has viewed,

## 2019-05-31 ENCOUNTER — Ambulatory Visit: Payer: 59 | Admitting: Nurse Practitioner

## 2019-06-18 ENCOUNTER — Encounter: Payer: Self-pay | Admitting: Nurse Practitioner

## 2019-06-25 ENCOUNTER — Other Ambulatory Visit: Payer: Self-pay | Admitting: Nurse Practitioner

## 2019-06-25 DIAGNOSIS — E119 Type 2 diabetes mellitus without complications: Secondary | ICD-10-CM

## 2019-07-24 ENCOUNTER — Other Ambulatory Visit: Payer: Self-pay

## 2019-07-24 ENCOUNTER — Telehealth: Payer: Self-pay

## 2019-07-24 DIAGNOSIS — E118 Type 2 diabetes mellitus with unspecified complications: Secondary | ICD-10-CM

## 2019-07-24 DIAGNOSIS — E119 Type 2 diabetes mellitus without complications: Secondary | ICD-10-CM

## 2019-07-24 MED ORDER — ATORVASTATIN CALCIUM 10 MG PO TABS: 10.0000 mg | ORAL_TABLET | Freq: Every day | ORAL | 0 refills | Status: DC

## 2019-07-24 MED ORDER — VALSARTAN-HYDROCHLOROTHIAZIDE 160-25 MG PO TABS: 1.0000 | ORAL_TABLET | Freq: Every day | ORAL | 1 refills | Status: DC

## 2019-07-24 MED ORDER — METFORMIN HCL 500 MG PO TABS: 500.0000 mg | ORAL_TABLET | Freq: Two times a day (BID) | ORAL | 1 refills | Status: DC

## 2019-07-24 MED ORDER — OZEMPIC (0.25 OR 0.5 MG/DOSE) 2 MG/1.5ML ~~LOC~~ SOPN: 0.5000 mg | PEN_INJECTOR | SUBCUTANEOUS | 3 refills | Status: DC

## 2019-07-24 NOTE — Telephone Encounter (Signed)
Patient called requesting that we transfer her meds to [facility] on [address]. I left her a v/m to call the office pt is due for an appt. YL,RMA

## 2019-08-01 ENCOUNTER — Encounter: Payer: Self-pay | Admitting: Nurse Practitioner

## 2019-08-01 ENCOUNTER — Ambulatory Visit: Payer: Self-pay | Admitting: Nurse Practitioner

## 2019-08-01 ENCOUNTER — Other Ambulatory Visit: Payer: Self-pay

## 2019-08-01 VITALS — BP 160/90 | HR 90 | Temp 97.7°F | Ht 60.4 in | Wt 181.6 lb

## 2019-08-01 DIAGNOSIS — E66812 Obesity, class 2: Secondary | ICD-10-CM

## 2019-08-01 DIAGNOSIS — I10 Essential (primary) hypertension: Secondary | ICD-10-CM

## 2019-08-01 DIAGNOSIS — E782 Mixed hyperlipidemia: Secondary | ICD-10-CM

## 2019-08-01 DIAGNOSIS — Z6838 Body mass index (BMI) 38.0-38.9, adult: Secondary | ICD-10-CM

## 2019-08-01 DIAGNOSIS — E118 Type 2 diabetes mellitus with unspecified complications: Secondary | ICD-10-CM

## 2019-08-01 DIAGNOSIS — R221 Localized swelling, mass and lump, neck: Secondary | ICD-10-CM

## 2019-08-01 NOTE — Progress Notes (Signed)
Subjective:     Patient ID: Shannon [name] , female    DOB: 02-08-1966 , 54 y.o.   MRN: [id number]   Chief Complaint  Patient presents with  . Diabetes  . Hypertension    HPI  She has not had the covid vaccine  Wt Readings from Last 3 Encounters: 08/01/19 : 181 lb 9.6 oz (82.4 kg) 02/28/19 : 183 lb 9.6 oz (83.3 kg) 02/05/19 : 184 lb (83.5 kg)  .    Diabetes She presents for her follow-up diabetic visit. She has type 2 diabetes mellitus. Her disease course has been stable. There are no hypoglycemic associated symptoms. Pertinent negatives for hypoglycemia include no dizziness or headaches. Pertinent negatives for diabetes include no blurred vision, no chest pain, no fatigue, no polydipsia, no polyphagia and no polyuria. There are no hypoglycemic complications. There are no diabetic complications. Risk factors for coronary artery disease include diabetes mellitus, obesity and sedentary lifestyle. Current diabetic treatment includes oral agent (dual therapy) (metformin XR 770m and rybelsus). She is compliant with treatment some of the time. She is following a diabetic diet. When asked about meal planning, she reported none. She has not had a previous visit with a dietitian. She rarely (walking two times a week - 10 - 12 minutes ) participates in exercise. There is no change in her home blood glucose trend. (Average 101 when she checks 1-2 times a week.) An ACE inhibitor/angiotensin II receptor blocker is being taken. She does not see a podiatrist.Eye exam is not current (currently no insurance).  Hypertension This is a chronic problem. The current episode started more than 1 year ago. The problem is controlled. Pertinent negatives include no anxiety, blurred vision, chest pain or headaches. There are no associated agents to hypertension. Risk factors for coronary artery disease include obesity and sedentary lifestyle. There are no compliance problems.  There is no history of chronic renal  disease.     Past Medical History:  Diagnosis Date  . Diabetes ([place]   . Hypertension   . Inflammatory myopathy    treated 2012  . Wears glasses    reading     Family History  Problem Relation Age of Onset  . Hypertension Mother   . Diabetes Mother   . Hypertension Father   . Heart disease Other   . Hypertension Other   . Diabetes Other      Current Outpatient Medications:  .  atorvastatin (LIPITOR) 10 MG tablet, Take 1 tablet (10 mg total) by mouth daily., Disp: 90 tablet, Rfl: 0 .  cholecalciferol (VITAMIN D) 1000 UNITS tablet, Take 1,000 Units by mouth daily. , Disp: , Rfl:  .  metFORMIN (GLUCOPHAGE) 500 MG tablet, Take 1 tablet (500 mg total) by mouth 2 (two) times daily with a meal., Disp: 180 tablet, Rfl: 1 .  Semaglutide,0.25 or 0.5MG/DOS, (OZEMPIC, 0.25 OR 0.5 MG/DOSE,) 2 MG/1.5ML SOPN, Inject 0.5 mg into the skin once a week., Disp: 1 pen, Rfl: 3 .  valsartan-hydrochlorothiazide (DIOVAN-HCT) 160-25 MG tablet, Take 1 tablet by mouth daily., Disp: 90 tablet, Rfl: 1   No Known Allergies   Review of Systems  Constitutional: Negative for chills and fatigue.  Eyes: Negative for blurred vision.  Respiratory: Negative.  Negative for cough.   Cardiovascular: Negative.  Negative for chest pain and leg swelling.  Endocrine: Negative for polydipsia, polyphagia and polyuria.  Skin: Negative.   Neurological: Negative for dizziness and headaches.  Psychiatric/Behavioral: Negative.      Today's Vitals  08/01/19 0900  BP: (!) 160/90  Pulse: 90  Temp: 97.7 F (36.5 C)  TempSrc: Oral  Weight: 181 lb 9.6 oz (82.4 kg)  Height: 5' 0.4" (1.534 m)   Body mass index is 35 kg/m.   Objective:  Physical Exam Vitals reviewed.  Constitutional:      General: She is not in acute distress.    Appearance: Normal appearance. She is well-developed. She is obese.  Cardiovascular:     Rate and Rhythm: Normal rate and regular rhythm.     Pulses: Normal pulses.     Heart sounds:  Normal heart sounds. No murmur.  Pulmonary:     Effort: Pulmonary effort is normal. No respiratory distress.     Breath sounds: Normal breath sounds.  Chest:     Chest wall: No tenderness.  Musculoskeletal:        General: Normal range of motion.     Cervical back: Normal range of motion and neck supple.  Skin:    General: Skin is warm and dry.     Capillary Refill: Capillary refill takes less than 2 seconds.  Neurological:     General: No focal deficit present.     Mental Status: She is alert and oriented to person, place, and time.     Cranial Nerves: No cranial nerve deficit.  Psychiatric:        Mood and Affect: Mood normal.        Behavior: Behavior normal.        Thought Content: Thought content normal.        Judgment: Judgment normal.         Assessment And Plan:     1. Essential hypertension . B/P is fairly controlled. . Will check Kidney functions  2. Class 2 severe obesity due to excess calories with serious comorbidity and body mass index (BMI) of 38.0 to 38.9 in adult [facility])  Chronic  Discussed healthy diet and regular exercise options   Encouraged to exercise at least 150 minutes per week with 2 days of strength training  3. Type 2 diabetes mellitus with complication, without long-term current use of insulin (HCC)  Chronic,  She is tolerating her ozempic well.    I have given her a sample she is currently self pay. May need to make changes at a later time or can see about getting her patient assistance.    Minette Brine, FNP    THE PATIENT IS ENCOURAGED TO PRACTICE SOCIAL DISTANCING DUE TO THE COVID-19 PANDEMIC.

## 2019-08-01 NOTE — Patient Instructions (Signed)
DASH Eating Plan DASH stands for "Dietary Approaches to Stop Hypertension." The DASH eating plan is a healthy eating plan that has been shown to reduce high blood pressure (hypertension). It may also reduce your risk for type 2 diabetes, heart disease, and stroke. The DASH eating plan may also help with weight loss. What are tips for following this plan?  General guidelines  Avoid eating more than 2,300 mg (milligrams) of salt (sodium) a day. If you have hypertension, you may need to reduce your sodium intake to 1,500 mg a day.  Limit alcohol intake to no more than 1 drink a day for nonpregnant women and 2 drinks a day for men. One drink equals 12 oz of beer, 5 oz of wine, or 1 oz of hard liquor.  Work with your health care provider to maintain a healthy body weight or to lose weight. Ask what an ideal weight is for you.  Get at least 30 minutes of exercise that causes your heart to beat faster (aerobic exercise) most days of the week. Activities may include walking, swimming, or biking.  Work with your health care provider or diet and nutrition specialist (dietitian) to adjust your eating plan to your individual calorie needs. Reading food labels   Check food labels for the amount of sodium per serving. Choose foods with less than 5 percent of the Daily Value of sodium. Generally, foods with less than 300 mg of sodium per serving fit into this eating plan.  To find whole grains, look for the word "whole" as the first word in the ingredient list. Shopping  Buy products labeled as "low-sodium" or "no salt added."  Buy fresh foods. Avoid canned foods and premade or frozen meals. Cooking  Avoid adding salt when cooking. Use salt-free seasonings or herbs instead of table salt or sea salt. Check with your health care provider or pharmacist before using salt substitutes.  Do not fry foods. Cook foods using healthy methods such as baking, boiling, grilling, and broiling instead.  Cook with  heart-healthy oils, such as olive, canola, soybean, or sunflower oil. Meal planning  Eat a balanced diet that includes: ? 5 or more servings of fruits and vegetables each day. At each meal, try to fill half of your plate with fruits and vegetables. ? Up to 6-8 servings of whole grains each day. ? Less than 6 oz of lean meat, poultry, or fish each day. A 3-oz serving of meat is about the same size as a deck of cards. One egg equals 1 oz. ? 2 servings of low-fat dairy each day. ? A serving of nuts, seeds, or beans 5 times each week. ? Heart-healthy fats. Healthy fats called Omega-3 fatty acids are found in foods such as flaxseeds and coldwater fish, like sardines, salmon, and mackerel.  Limit how much you eat of the following: ? Canned or prepackaged foods. ? Food that is high in trans fat, such as fried foods. ? Food that is high in saturated fat, such as fatty meat. ? Sweets, desserts, sugary drinks, and other foods with added sugar. ? Full-fat dairy products.  Do not salt foods before eating.  Try to eat at least 2 vegetarian meals each week.  Eat more home-cooked food and less restaurant, buffet, and fast food.  When eating at a restaurant, ask that your food be prepared with less salt or no salt, if possible. What foods are recommended? The items listed may not be a complete list. Talk with your dietitian about   what dietary choices are best for you. Grains Whole-grain or whole-wheat bread. Whole-grain or whole-wheat pasta. Brown rice. Oatmeal. Quinoa. Bulgur. Whole-grain and low-sodium cereals. Pita bread. Low-fat, low-sodium crackers. Whole-wheat flour tortillas. Vegetables Fresh or frozen vegetables (raw, steamed, roasted, or grilled). Low-sodium or reduced-sodium tomato and vegetable juice. Low-sodium or reduced-sodium tomato sauce and tomato paste. Low-sodium or reduced-sodium canned vegetables. Fruits All fresh, dried, or frozen fruit. Canned fruit in natural juice (without  added sugar). Meat and other protein foods Skinless chicken or turkey. Ground chicken or turkey. Pork with fat trimmed off. Fish and seafood. Egg whites. Dried beans, peas, or lentils. Unsalted nuts, nut butters, and seeds. Unsalted canned beans. Lean cuts of beef with fat trimmed off. Low-sodium, lean deli meat. Dairy Low-fat (1%) or fat-free (skim) milk. Fat-free, low-fat, or reduced-fat cheeses. Nonfat, low-sodium ricotta or cottage cheese. Low-fat or nonfat yogurt. Low-fat, low-sodium cheese. Fats and oils Soft margarine without trans fats. Vegetable oil. Low-fat, reduced-fat, or light mayonnaise and salad dressings (reduced-sodium). Canola, safflower, olive, soybean, and sunflower oils. Avocado. Seasoning and other foods Herbs. Spices. Seasoning mixes without salt. Unsalted popcorn and pretzels. Fat-free sweets. What foods are not recommended? The items listed may not be a complete list. Talk with your dietitian about what dietary choices are best for you. Grains Baked goods made with fat, such as croissants, muffins, or some breads. Dry pasta or rice meal packs. Vegetables Creamed or fried vegetables. Vegetables in a cheese sauce. Regular canned vegetables (not low-sodium or reduced-sodium). Regular canned tomato sauce and paste (not low-sodium or reduced-sodium). Regular tomato and vegetable juice (not low-sodium or reduced-sodium). Pickles. Olives. Fruits Canned fruit in a light or heavy syrup. Fried fruit. Fruit in cream or butter sauce. Meat and other protein foods Fatty cuts of meat. Ribs. Fried meat. Bacon. Sausage. Bologna and other processed lunch meats. Salami. Fatback. Hotdogs. Bratwurst. Salted nuts and seeds. Canned beans with added salt. Canned or smoked fish. Whole eggs or egg yolks. Chicken or turkey with skin. Dairy Whole or 2% milk, cream, and half-and-half. Whole or full-fat cream cheese. Whole-fat or sweetened yogurt. Full-fat cheese. Nondairy creamers. Whipped toppings.  Processed cheese and cheese spreads. Fats and oils Butter. Stick margarine. Lard. Shortening. Ghee. Bacon fat. Tropical oils, such as coconut, palm kernel, or palm oil. Seasoning and other foods Salted popcorn and pretzels. Onion salt, garlic salt, seasoned salt, table salt, and sea salt. Worcestershire sauce. Tartar sauce. Barbecue sauce. Teriyaki sauce. Soy sauce, including reduced-sodium. Steak sauce. Canned and packaged gravies. Fish sauce. Oyster sauce. Cocktail sauce. Horseradish that you find on the shelf. Ketchup. Mustard. Meat flavorings and tenderizers. Bouillon cubes. Hot sauce and Tabasco sauce. Premade or packaged marinades. Premade or packaged taco seasonings. Relishes. Regular salad dressings. Where to find more information:  National Heart, Lung, and Blood Institute: www.nhlbi.nih.gov  American Heart Association: www.heart.org Summary  The DASH eating plan is a healthy eating plan that has been shown to reduce high blood pressure (hypertension). It may also reduce your risk for type 2 diabetes, heart disease, and stroke.  With the DASH eating plan, you should limit salt (sodium) intake to 2,300 mg a day. If you have hypertension, you may need to reduce your sodium intake to 1,500 mg a day.  When on the DASH eating plan, aim to eat more fresh fruits and vegetables, whole grains, lean proteins, low-fat dairy, and heart-healthy fats.  Work with your health care provider or diet and nutrition specialist (dietitian) to adjust your eating plan to your   individual calorie needs. This information is not intended to replace advice given to you by your health care provider. Make sure you discuss any questions you have with your health care provider. Document Revised: 03/25/2017 Document Reviewed: 04/05/2016 Elsevier Patient Education  2020 [employer].

## 2019-08-02 LAB — LIPID PANEL
Chol/HDL Ratio: 4.4 ratio (ref 0.0–4.4)
Cholesterol, Total: 219 mg/dL — ABNORMAL HIGH (ref 100–199)
HDL: 50 mg/dL (ref >39)
LDL Chol Calc (NIH): 138 mg/dL — ABNORMAL HIGH (ref 0–99)
Triglycerides: 175 mg/dL — ABNORMAL HIGH (ref 0–149)
VLDL Cholesterol Cal: 31 mg/dL (ref 5–40)

## 2019-08-02 LAB — CMP14+EGFR
ALT: 21 IU/L (ref 0–32)
AST: 22 IU/L (ref 0–40)
Albumin/Globulin Ratio: 1.3 (ref 1.2–2.2)
Albumin: 4.4 g/dL (ref 3.8–4.9)
Alkaline Phosphatase: 51 IU/L (ref 39–117)
BUN/Creatinine Ratio: 25 — ABNORMAL HIGH (ref 9–23)
BUN: 17 mg/dL (ref 6–24)
Bilirubin Total: 0.8 mg/dL (ref 0.0–1.2)
CO2: 25 mmol/L (ref 20–29)
Calcium: 10 mg/dL (ref 8.7–10.2)
Chloride: 95 mmol/L — ABNORMAL LOW (ref 96–106)
Creatinine, Ser: 0.69 mg/dL (ref 0.57–1.00)
GFR calc Af Amer: 117 mL/min/{1.73_m2} (ref >59)
GFR calc non Af Amer: 101 mL/min/{1.73_m2} (ref >59)
Globulin, Total: 3.5 g/dL (ref 1.5–4.5)
Glucose: 157 mg/dL — ABNORMAL HIGH (ref 65–99)
Potassium: 4.1 mmol/L (ref 3.5–5.2)
Sodium: 137 mmol/L (ref 134–144)
Total Protein: 7.9 g/dL (ref 6.0–8.5)

## 2019-08-02 LAB — TSH: TSH: 1.27 u[IU]/mL (ref 0.450–4.500)

## 2019-08-02 LAB — HEMOGLOBIN A1C
Est. average glucose Bld gHb Est-mCnc: 160 mg/dL
Hgb A1c MFr Bld: 7.2 % — ABNORMAL HIGH (ref 4.8–5.6)

## 2019-08-06 NOTE — Progress Notes (Signed)
Shannon [name], thanks have her to go on [employer] and check on financial assistance for the [employer].

## 2019-10-20 ENCOUNTER — Ambulatory Visit: Payer: Self-pay | Attending: Internal Medicine

## 2019-10-20 DIAGNOSIS — Z23 Encounter for immunization: Secondary | ICD-10-CM

## 2019-10-20 NOTE — Progress Notes (Signed)
   Covid-19 Vaccination Clinic  Name:  Shannon [name]    MRN: [id number] DOB: November 06, 1965  10/20/2019  [name] was observed post Covid-19 immunization for 15 minutes without incident. She was provided with Vaccine Information Sheet and instruction to access the V-Safe system.   Ms. Point was instructed to call 911 with any severe reactions post vaccine: Marland Kitchen Difficulty breathing  . Swelling of face and throat  . A fast heartbeat  . A bad rash all over body  . Dizziness and weakness   Immunizations Administered    Name Date Dose VIS Date Route   Pfizer COVID-19 Vaccine 10/20/2019 11:33 AM 0.3 mL 06/20/2018 Intramuscular   Manufacturer: [place]   Lot: [id number]   [place]: [id number]

## 2019-11-01 ENCOUNTER — Ambulatory Visit: Payer: Self-pay | Admitting: Nurse Practitioner

## 2019-11-10 ENCOUNTER — Ambulatory Visit: Payer: Self-pay | Attending: Internal Medicine

## 2019-11-10 DIAGNOSIS — Z23 Encounter for immunization: Secondary | ICD-10-CM

## 2019-11-10 NOTE — Progress Notes (Signed)
Covid-19 Vaccination Clinic  Name:  Shannon [name]    MRN: [id number] DOB: Sep 02, 1965  11/10/2019  [name] was observed post Covid-19 immunization for 15 minutes without incident. She was provided with Vaccine Information Sheet and instruction to access the V-Safe system.   [name] was instructed to call 911 with any severe reactions post vaccine: Marland Kitchen Difficulty breathing  . Swelling of face and throat  . A fast heartbeat  . A bad rash all over body  . Dizziness and weakness   Immunizations Administered    Name Date Dose VIS Date Route   Pfizer COVID-19 Vaccine 11/10/2019 11:24 AM 0.3 mL 06/20/2018 Intramuscular   Manufacturer: [place]   Lot: [id number]   [place]: [id number]

## 2020-03-03 ENCOUNTER — Ambulatory Visit: Payer: 59 | Admitting: Nurse Practitioner

## 2020-03-03 ENCOUNTER — Encounter: Payer: Self-pay | Admitting: Nurse Practitioner

## 2020-03-03 ENCOUNTER — Other Ambulatory Visit: Payer: Self-pay

## 2020-03-03 VITALS — BP 124/80 | HR 59 | Temp 98.5°F | Ht 60.0 in | Wt 177.0 lb

## 2020-03-03 DIAGNOSIS — Z0000 Encounter for general adult medical examination without abnormal findings: Secondary | ICD-10-CM

## 2020-03-03 DIAGNOSIS — I10 Essential (primary) hypertension: Secondary | ICD-10-CM

## 2020-03-03 DIAGNOSIS — R82998 Other abnormal findings in urine: Secondary | ICD-10-CM

## 2020-03-03 DIAGNOSIS — Z1159 Encounter for screening for other viral diseases: Secondary | ICD-10-CM

## 2020-03-03 DIAGNOSIS — E118 Type 2 diabetes mellitus with unspecified complications: Secondary | ICD-10-CM

## 2020-03-03 DIAGNOSIS — Z1231 Encounter for screening mammogram for malignant neoplasm of breast: Secondary | ICD-10-CM

## 2020-03-03 DIAGNOSIS — E782 Mixed hyperlipidemia: Secondary | ICD-10-CM

## 2020-03-03 DIAGNOSIS — Z23 Encounter for immunization: Secondary | ICD-10-CM

## 2020-03-03 LAB — POCT URINALYSIS DIPSTICK
Blood, UA: NEGATIVE
Glucose, UA: NEGATIVE
Ketones, UA: NEGATIVE
Nitrite, UA: NEGATIVE
Protein, UA: POSITIVE — AB
Spec Grav, UA: >=1.03 — AB (ref 1.010–1.025)
Urobilinogen, UA: 0.2 E.U./dL
pH, UA: 5.5 (ref 5.0–8.0)

## 2020-03-03 LAB — POCT UA - MICROALBUMIN
Albumin/Creatinine Ratio, Urine, POC: 300
Creatinine, POC: 300 mg/dL
Microalbumin Ur, POC: 80 mg/L

## 2020-03-03 MED ORDER — GLIPIZIDE 5 MG PO TABS: 5.0000 mg | ORAL_TABLET | Freq: Two times a day (BID) | ORAL | 3 refills | Status: DC

## 2020-03-03 NOTE — Progress Notes (Signed)
I,Yamilka Roman [employer] as a Education administrator for [employer], FNP.,have documented all relevant documentation on the behalf of Minette Brine, FNP,as directed by  Minette Brine, FNP while in the presence of Minette Brine, [place]. This visit occurred during the SARS-CoV-2 public health emergency.  Safety protocols were in place, including screening questions prior to the visit, additional usage of staff PPE, and extensive cleaning of exam room while observing appropriate contact time as indicated for disinfecting solutions.  Subjective:     Patient ID: Shannon [name] , female    DOB: [birth year] , 54 y.o.   MRN: [id number]   Chief Complaint  Patient presents with  . Annual Exam    HPI  Patient here for HM.  Wt Readings from Last 3 Encounters: 03/03/20 : 177 lb (80.3 kg) 08/01/19 : 181 lb 9.6 oz (82.4 kg) 02/28/19 : 183 lb 9.6 oz (83.3 kg)  Diabetes Pertinent negatives for diabetes include no chest pain. She is following a generally unhealthy diet. She has not had a previous visit with a dietitian. She rarely participates in exercise. Eye exam current: she is due for an appt.     Past Medical History:  Diagnosis Date  . Diabetes ([place])   . Hypertension   . Inflammatory myopathy    treated 2012  . Wears glasses    reading     Family History  Problem Relation Age of Onset  . Hypertension Mother   . Diabetes Mother   . Hypertension Father   . Heart disease Other   . Hypertension Other   . Diabetes Other      Current Outpatient Medications:  .  atorvastatin (LIPITOR) 10 MG tablet, Take 1 tablet (10 mg total) by mouth daily., Disp: 90 tablet, Rfl: 0 .  cholecalciferol (VITAMIN D) 1000 UNITS tablet, Take 1,000 Units by mouth daily. , Disp: , Rfl:  .  metFORMIN (GLUCOPHAGE) 500 MG tablet, Take 1 tablet (500 mg total) by mouth 2 (two) times daily with a meal., Disp: 180 tablet, Rfl: 1 .  valsartan-hydrochlorothiazide (DIOVAN-HCT) 160-25 MG tablet, Take 1 tablet by mouth daily., Disp:  90 tablet, Rfl: 1 .  glipiZIDE (GLUCOTROL) 5 MG tablet, Take 1 tablet (5 mg total) by mouth 2 (two) times daily., Disp: 60 tablet, Rfl: 3   No Known Allergies    The patient states she uses none for birth control.  No LMP recorded. (Menstrual status: Irregular Periods).. Negative for Dysmenorrhea and Negative for Menorrhagia. Negative for: breast discharge, breast lump(s), breast pain and breast self exam. Associated symptoms include abnormal vaginal bleeding. Pertinent negatives include abnormal bleeding (hematology), anxiety, decreased libido, depression, difficulty falling sleep, dyspareunia, history of infertility, nocturia, sexual dysfunction, sleep disturbances, urinary incontinence, urinary urgency, vaginal discharge and vaginal itching. Diet regular.The patient states her exercise level is minimal.    . The patient's tobacco use is:  Social History   Tobacco Use  Smoking Status Never Smoker  Smokeless Tobacco Never Used   She has been exposed to passive smoke. The patient's alcohol use is:  Social History   Substance and Sexual Activity  Alcohol Use No   Additional information: Last pap 2019, next one scheduled for 2022  Review of Systems  Constitutional: Negative.   HENT: Negative.   Eyes: Negative.   Respiratory: Negative.   Cardiovascular: Negative.  Negative for chest pain and leg swelling.  Gastrointestinal: Negative.   Endocrine: Negative.   Genitourinary: Negative.   Musculoskeletal: Negative.   Skin: Negative.   Allergic/Immunologic: Negative.  Neurological: Negative.   Hematological: Negative.   Psychiatric/Behavioral: Negative.      Today's Vitals   03/03/20 1439  BP: 124/80  Pulse: (!) 59  Temp: 98.5 F (36.9 C)  Weight: 177 lb (80.3 kg)  Height: 5' (1.524 m)  PainSc: 0-No pain   Body mass index is 34.57 kg/m.   Objective:  Physical Exam Constitutional:      General: She is not in acute distress.    Appearance: Normal appearance. She is  well-developed. She is obese.  HENT:     Head: Normocephalic and atraumatic.     Right Ear: Hearing, tympanic membrane, ear canal and external ear normal. There is no impacted cerumen.     Left Ear: Hearing, tympanic membrane, ear canal and external ear normal. There is no impacted cerumen.     Nose:     Comments: Deferred - masked    Mouth/Throat:     Comments: Deferred - masked Eyes:     General: Lids are normal.     Extraocular Movements: Extraocular movements intact.     Conjunctiva/sclera: Conjunctivae normal.     Pupils: Pupils are equal, round, and reactive to light.     Funduscopic exam:    Right eye: No papilledema.        Left eye: No papilledema.  Neck:     Thyroid: No thyroid mass.     Vascular: No carotid bruit.  Cardiovascular:     Rate and Rhythm: Normal rate and regular rhythm.     Pulses: Normal pulses.     Heart sounds: Normal heart sounds. No murmur heard.   Pulmonary:     Effort: Pulmonary effort is normal.     Breath sounds: Normal breath sounds.  Chest:     Chest wall: No mass.     Breasts: Tanner Score is 5.        Right: Normal. No mass or tenderness.        Left: Normal. No mass or tenderness.  Abdominal:     General: Abdomen is flat. Bowel sounds are normal. There is no distension.     Palpations: Abdomen is soft.     Tenderness: There is no abdominal tenderness.  Genitourinary:    Rectum: Guaiac result negative.  Musculoskeletal:        General: No swelling. Normal range of motion.     Cervical back: Full passive range of motion without pain, normal range of motion and neck supple.     Right lower leg: No edema.     Left lower leg: No edema.  Lymphadenopathy:     Upper Body:     Right upper body: No supraclavicular, axillary or pectoral adenopathy.     Left upper body: No supraclavicular, axillary or pectoral adenopathy.  Skin:    General: Skin is warm and dry.     Capillary Refill: Capillary refill takes less than 2 seconds.   Neurological:     General: No focal deficit present.     Mental Status: She is alert and oriented to person, place, and time.     Cranial Nerves: No cranial nerve deficit.     Sensory: No sensory deficit.  Psychiatric:        Mood and Affect: Mood normal.        Behavior: Behavior normal.        Thought Content: Thought content normal.        Judgment: Judgment normal.  Assessment And Plan:     1. Encounter for general adult medical examination w/o abnormal findings . Behavior modifications discussed and diet history reviewed.   . Pt will continue to exercise regularly and modify diet with low GI, plant based foods and decrease intake of processed foods.  . Recommend intake of daily multivitamin, Vitamin D, and calcium.  . Recommend mammogram(ordered may need to try and find a cash paying mammogram) and colonoscopy(done in 2019) for preventive screenings, as well as recommend immunizations that include influenza, TDAP (up to date) - CBC  2. Essential hypertension . B/P is well controlled.  . CMP ordered to check renal function.  . The importance of regular exercise and dietary modification was stressed to the patient.  . Stressed importance of losing ten percent of her body weight to help with B/P control.  . The weight loss would help with decreasing cardiac and cancer risk as well.  . EKG done with Sinus tachycardia - CMP14+EGFR - POCT Urinalysis Dipstick (81002) - POCT UA - Microalbumin  3. Type 2 diabetes mellitus with complication, without long-term current use of insulin (HCC)  Chronic, she has not been taking the Ozempic due to not having any insurance. Will start her on glipizide pending lab results this is more affordable.  Discussed also to get a glucometer from [facility] which may be more cost effective - CMP14+EGFR - Hemoglobin A1c - glipiZIDE (GLUCOTROL) 5 MG tablet; Take 1 tablet (5 mg total) by mouth 2 (two) times daily.  Dispense: 60 tablet; Refill:  3  4. Mixed hyperlipidemia  Chronic, controlled  Continue with current medications, tolerating well - CMP14+EGFR - Lipid panel  5. Need for influenza vaccination  Influenza vaccine administered  Encouraged to take Tylenol as needed for fever or muscle aches. - Flu Vaccine QUAD 6+ mos PF IM (Fluarix Quad PF)  6. Encounter for screening mammogram for malignant neoplasm of breast  Pt instructed on Self Breast Exam.According to ACOG guidelines Women aged 64 and older are recommended to get an annual mammogram. Form completed and given to patient contact the [facility] for appointment scheduing.   Pt encouraged to get annual mammogram - MM Digital Screening; Future  7. Encounter for hepatitis C screening test for low risk patient  Will check Hepatitis C screening due to recent recommendations to screen all adults 18 years and older - Hepatitis C antibody  8. Urine white blood cells increased Will send urine for culture she has trace leuk and positive for protein.  - Urine Culture     Patient was given opportunity to ask questions. Patient verbalized understanding of the plan and was able to repeat key elements of the plan. All questions were answered to their satisfaction.    Teola Bradley, FNP, have reviewed all documentation for this visit. The documentation on 03/03/20 for the exam, diagnosis, procedures, and orders are all accurate and complete.  THE PATIENT IS ENCOURAGED TO PRACTICE SOCIAL DISTANCING DUE TO THE COVID-19 PANDEMIC.

## 2020-03-03 NOTE — Patient Instructions (Signed)
Health Maintenance, Female Adopting a healthy lifestyle and getting preventive care are important in promoting health and wellness. Ask your health care provider about:  The right schedule for you to have regular tests and exams.  Things you can do on your own to prevent diseases and keep yourself healthy. What should I know about diet, weight, and exercise? Eat a healthy diet   Eat a diet that includes plenty of vegetables, fruits, low-fat dairy products, and lean protein.  Do not eat a lot of foods that are high in solid fats, added sugars, or sodium. Maintain a healthy weight Body mass index (BMI) is used to identify weight problems. It estimates body fat based on height and weight. Your health care provider can help determine your BMI and help you achieve or maintain a healthy weight. Get regular exercise Get regular exercise. This is one of the most important things you can do for your health. Most adults should:  Exercise for at least 150 minutes each week. The exercise should increase your heart rate and make you sweat (moderate-intensity exercise).  Do strengthening exercises at least twice a week. This is in addition to the moderate-intensity exercise.  Spend less time sitting. Even light physical activity can be beneficial. Watch cholesterol and blood lipids Have your blood tested for lipids and cholesterol at 52 years of age, then have this test every 5 years. Have your cholesterol levels checked more often if:  Your lipid or cholesterol levels are high.  You are older than 52 years of age.  You are at high risk for heart disease. What should I know about cancer screening? Depending on your health history and family history, you may need to have cancer screening at various ages. This may include screening for:  Breast cancer.  Cervical cancer.  Colorectal cancer.  Skin cancer.  Lung cancer. What should I know about heart disease, diabetes, and high blood  pressure? Blood pressure and heart disease  High blood pressure causes heart disease and increases the risk of stroke. This is more likely to develop in people who have high blood pressure readings, are of African descent, or are overweight.  Have your blood pressure checked: ? Every 3-5 years if you are 54-62 years of age. ? Every year if you are 93 years old or older. Diabetes Have regular diabetes screenings. This checks your fasting blood sugar level. Have the screening done:  Once every three years after age 69 if you are at a normal weight and have a low risk for diabetes.  More often and at a younger age if you are overweight or have a high risk for diabetes. What should I know about preventing infection? Hepatitis B If you have a higher risk for hepatitis B, you should be screened for this virus. Talk with your health care provider to find out if you are at risk for hepatitis B infection. Hepatitis C Testing is recommended for:  Everyone born from [birth year] through 1963.  Anyone with known risk factors for hepatitis C. Sexually transmitted infections (STIs)  Get screened for STIs, including gonorrhea and chlamydia, if: ? You are sexually active and are younger than 52 years of age. ? You are older than 52 years of age and your health care provider tells you that you are at risk for this type of infection. ? Your sexual activity has changed since you were last screened, and you are at increased risk for chlamydia or gonorrhea. Ask your health care provider if  you are at risk.  Ask your health care provider about whether you are at high risk for HIV. Your health care provider may recommend a prescription medicine to help prevent HIV infection. If you choose to take medicine to prevent HIV, you should first get tested for HIV. You should then be tested every 3 months for as long as you are taking the medicine. Pregnancy  If you are about to stop having your period (premenopausal) and  you may become pregnant, seek counseling before you get pregnant.  Take 400 to 800 micrograms (mcg) of folic acid every day if you become pregnant.  Ask for birth control (contraception) if you want to prevent pregnancy. Osteoporosis and menopause Osteoporosis is a disease in which the bones lose minerals and strength with aging. This can result in bone fractures. If you are 65 years old or older, or if you are at risk for osteoporosis and fractures, ask your health care provider if you should:  Be screened for bone loss.  Take a calcium or vitamin D supplement to lower your risk of fractures.  Be given hormone replacement therapy (HRT) to treat symptoms of menopause. Follow these instructions at home: Lifestyle  Do not use any products that contain nicotine or tobacco, such as cigarettes, e-cigarettes, and chewing tobacco. If you need help quitting, ask your health care provider.  Do not use street drugs.  Do not share needles.  Ask your health care provider for help if you need support or information about quitting drugs. Alcohol use  Do not drink alcohol if: ? Your health care provider tells you not to drink. ? You are pregnant, may be pregnant, or are planning to become pregnant.  If you drink alcohol: ? Limit how much you use to 0-1 drink a day. ? Limit intake if you are breastfeeding.  Be aware of how much alcohol is in your drink. In the [place] one drink equals one 12 oz bottle of beer (355 mL), one 5 oz glass of wine (148 mL), or one 1 oz glass of hard liquor (44 mL). General instructions  Schedule regular health, dental, and eye exams.  Stay current with your vaccines.  Tell your health care provider if: ? You often feel depressed. ? You have ever been abused or do not feel safe at home. Summary  Adopting a healthy lifestyle and getting preventive care are important in promoting health and wellness.  Follow your health care provider's instructions about healthy  diet, exercising, and getting tested or screened for diseases.  Follow your health care provider's instructions on monitoring your cholesterol and blood pressure. This information is not intended to replace advice given to you by your health care provider. Make sure you discuss any questions you have with your health care provider. Document Revised: 04/05/2018 Document Reviewed: 04/05/2018 Elsevier Patient Education  2020 Elsevier Inc.  

## 2020-03-04 LAB — CBC
Hematocrit: 41.4 % (ref 34.0–46.6)
Hemoglobin: 14 g/dL (ref 11.1–15.9)
MCH: 29.4 pg (ref 26.6–33.0)
MCHC: 33.8 g/dL (ref 31.5–35.7)
MCV: 87 fL (ref 79–97)
Platelets: 436 10*3/uL (ref 150–450)
RBC: 4.77 x10E6/uL (ref 3.77–5.28)
RDW: 13.7 % (ref 11.7–15.4)
WBC: 9.2 10*3/uL (ref 3.4–10.8)

## 2020-03-04 LAB — HEMOGLOBIN A1C
Est. average glucose Bld gHb Est-mCnc: 160 mg/dL
Hgb A1c MFr Bld: 7.2 % — ABNORMAL HIGH (ref 4.8–5.6)

## 2020-03-04 LAB — CMP14+EGFR
ALT: 24 IU/L (ref 0–32)
AST: 25 IU/L (ref 0–40)
Albumin/Globulin Ratio: 1.3 (ref 1.2–2.2)
Albumin: 4.5 g/dL (ref 3.8–4.9)
Alkaline Phosphatase: 48 IU/L (ref 44–121)
BUN/Creatinine Ratio: 19 (ref 9–23)
BUN: 15 mg/dL (ref 6–24)
Bilirubin Total: 1.3 mg/dL — ABNORMAL HIGH (ref 0.0–1.2)
CO2: 29 mmol/L (ref 20–29)
Calcium: 10.1 mg/dL (ref 8.7–10.2)
Chloride: 92 mmol/L — ABNORMAL LOW (ref 96–106)
Creatinine, Ser: 0.78 mg/dL (ref 0.57–1.00)
GFR calc Af Amer: 102 mL/min/{1.73_m2} (ref >59)
GFR calc non Af Amer: 88 mL/min/{1.73_m2} (ref >59)
Globulin, Total: 3.4 g/dL (ref 1.5–4.5)
Glucose: 132 mg/dL — ABNORMAL HIGH (ref 65–99)
Potassium: 4.2 mmol/L (ref 3.5–5.2)
Sodium: 135 mmol/L (ref 134–144)
Total Protein: 7.9 g/dL (ref 6.0–8.5)

## 2020-03-04 LAB — LIPID PANEL
Chol/HDL Ratio: 5.2 ratio — ABNORMAL HIGH (ref 0.0–4.4)
Cholesterol, Total: 244 mg/dL — ABNORMAL HIGH (ref 100–199)
HDL: 47 mg/dL (ref >39)
LDL Chol Calc (NIH): 158 mg/dL — ABNORMAL HIGH (ref 0–99)
Triglycerides: 211 mg/dL — ABNORMAL HIGH (ref 0–149)
VLDL Cholesterol Cal: 39 mg/dL (ref 5–40)

## 2020-03-04 LAB — HEPATITIS C ANTIBODY: Hep C Virus Ab: <0.1 s/co ratio (ref 0.0–0.9)

## 2020-03-05 LAB — URINE CULTURE

## 2020-03-12 ENCOUNTER — Other Ambulatory Visit: Payer: Self-pay | Admitting: Nurse Practitioner

## 2020-03-12 DIAGNOSIS — E118 Type 2 diabetes mellitus with unspecified complications: Secondary | ICD-10-CM

## 2020-03-12 MED ORDER — ATORVASTATIN CALCIUM 20 MG PO TABS: 20.0000 mg | ORAL_TABLET | Freq: Every day | ORAL | 1 refills | Status: DC

## 2020-03-28 ENCOUNTER — Other Ambulatory Visit: Payer: Self-pay | Admitting: Nurse Practitioner

## 2020-03-28 DIAGNOSIS — E119 Type 2 diabetes mellitus without complications: Secondary | ICD-10-CM

## 2020-03-28 DIAGNOSIS — E118 Type 2 diabetes mellitus with unspecified complications: Secondary | ICD-10-CM

## 2020-04-17 LAB — HM MAMMOGRAPHY

## 2020-04-24 ENCOUNTER — Encounter: Payer: Self-pay | Admitting: Nurse Practitioner

## 2020-06-03 ENCOUNTER — Other Ambulatory Visit: Payer: Self-pay

## 2020-06-03 ENCOUNTER — Ambulatory Visit (INDEPENDENT_AMBULATORY_CARE_PROVIDER_SITE_OTHER): Payer: Self-pay | Admitting: Nurse Practitioner

## 2020-06-03 ENCOUNTER — Encounter: Payer: Self-pay | Admitting: Nurse Practitioner

## 2020-06-03 VITALS — BP 122/80 | HR 68 | Temp 97.7°F | Ht 60.0 in | Wt 180.8 lb

## 2020-06-03 DIAGNOSIS — I10 Essential (primary) hypertension: Secondary | ICD-10-CM

## 2020-06-03 DIAGNOSIS — E782 Mixed hyperlipidemia: Secondary | ICD-10-CM

## 2020-06-03 DIAGNOSIS — E118 Type 2 diabetes mellitus with unspecified complications: Secondary | ICD-10-CM

## 2020-06-03 DIAGNOSIS — E119 Type 2 diabetes mellitus without complications: Secondary | ICD-10-CM

## 2020-06-03 DIAGNOSIS — Z6835 Body mass index (BMI) 35.0-35.9, adult: Secondary | ICD-10-CM

## 2020-06-03 LAB — POCT URINALYSIS DIPSTICK
Bilirubin, UA: NEGATIVE
Blood, UA: NEGATIVE
Glucose, UA: NEGATIVE
Ketones, UA: NEGATIVE
Leukocytes, UA: NEGATIVE
Nitrite, UA: NEGATIVE
Protein, UA: NEGATIVE
Spec Grav, UA: 1.01 (ref 1.010–1.025)
Urobilinogen, UA: 0.2 E.U./dL
pH, UA: 7.5 (ref 5.0–8.0)

## 2020-06-03 LAB — POCT UA - MICROALBUMIN
Albumin/Creatinine Ratio, Urine, POC: <30
Creatinine, POC: 50 mg/dL
Microalbumin Ur, POC: 10 mg/L

## 2020-06-03 MED ORDER — METFORMIN HCL 1000 MG PO TABS: ORAL_TABLET | ORAL | 1 refills | Status: DC

## 2020-06-03 MED ORDER — VALSARTAN-HYDROCHLOROTHIAZIDE 160-25 MG PO TABS: 1.0000 | ORAL_TABLET | Freq: Every day | ORAL | 1 refills | Status: DC

## 2020-06-03 NOTE — Progress Notes (Signed)
I,Yamilka Roman [employer] as a Education administrator for [employer], FNP.,have documented all relevant documentation on the behalf of Minette Brine, FNP,as directed by  Minette Brine, FNP while in the presence of Minette Brine, [place]. This visit occurred during the SARS-CoV-2 public health emergency.  Safety protocols were in place, including screening questions prior to the visit, additional usage of staff PPE, and extensive cleaning of exam room while observing appropriate contact time as indicated for disinfecting solutions.  Subjective:     Patient ID: Shannon [name] , female    DOB: 1965-08-05 , 55 y.o.   MRN: [id number]   Chief Complaint  Patient presents with  . Diabetes    HPI  Patient presents today for a f/u on her diabetes.   Wt Readings from Last 3 Encounters: 06/03/20 : 180 lb 12.8 oz (82 kg) 03/03/20 : 177 lb (80.3 kg) 08/01/19 : 181 lb 9.6 oz (82.4 kg)  excercising once a week.    Diabetes She presents for her follow-up diabetic visit. She has type 2 diabetes mellitus. Her disease course has been stable. There are no hypoglycemic associated symptoms. Pertinent negatives for hypoglycemia include no dizziness or headaches. Pertinent negatives for diabetes include no blurred vision, no chest pain, no fatigue, no polydipsia, no polyphagia and no polyuria. There are no hypoglycemic complications. There are no diabetic complications. Risk factors for coronary artery disease include diabetes mellitus, obesity and sedentary lifestyle. Current diabetic treatment includes oral agent (dual therapy) (metformin XR 772m and rybelsus). She is compliant with treatment some of the time. She is following a diabetic diet. When asked about meal planning, she reported none. She has not had a previous visit with a dietitian. She rarely (walking two times a week - 10 - 12 minutes ) participates in exercise. There is no change in her home blood glucose trend. (Average blood sugar 120 mg/dl) An ACE  inhibitor/angiotensin II receptor blocker is being taken. She does not see a podiatrist.Eye exam is not current (currently no insurance).  Hypertension This is a chronic problem. The current episode started more than 1 year ago. The problem is controlled. Pertinent negatives include no anxiety, blurred vision, chest pain or headaches. There are no associated agents to hypertension. Risk factors for coronary artery disease include obesity and sedentary lifestyle. There are no compliance problems.  There is no history of chronic renal disease.     Past Medical History:  Diagnosis Date  . [place]   . Hypertension   . Inflammatory myopathy    treated 2012  . Wears glasses    reading     Family History  Problem Relation Age of Onset  . Hypertension Mother   . Diabetes Mother   . Hypertension Father   . Heart disease Other   . Hypertension Other   . Diabetes Other      Current Outpatient Medications:  .  atorvastatin (LIPITOR) 20 MG tablet, Take 1 tablet (20 mg total) by mouth daily., Disp: 90 tablet, Rfl: 1 .  cholecalciferol (VITAMIN D) 1000 UNITS tablet, Take 1,000 Units by mouth daily. , Disp: , Rfl:  .  metFORMIN (GLUCOPHAGE) 1000 MG tablet, TAKE 1 TABLET BY MOUTH TWICE DAILY WITH  A  MEAL., Disp: 180 tablet, Rfl: 1 .  valsartan-hydrochlorothiazide (DIOVAN-HCT) 160-25 MG tablet, Take 1 tablet by mouth daily., Disp: 90 tablet, Rfl: 1   No Known Allergies   Review of Systems  Constitutional: Negative.  Negative for fatigue.  HENT: Negative.   Eyes: Negative.  Negative for blurred vision.  Respiratory: Negative.   Cardiovascular: Negative.  Negative for chest pain.  Gastrointestinal: Negative.   Endocrine: Negative.  Negative for polydipsia, polyphagia and polyuria.  Genitourinary: Negative.   Musculoskeletal: Negative.   Skin: Negative.   Neurological: Negative.  Negative for dizziness and headaches.  Hematological: Negative.   Psychiatric/Behavioral: Negative.       Today's Vitals   06/03/20 1407  BP: 122/80  Pulse: 68  Temp: 97.7 F (36.5 C)  Weight: 180 lb 12.8 oz (82 kg)  Height: 5' (1.524 m)  PainSc: 0-No pain   Body mass index is 35.31 kg/m.   Objective:  Physical Exam Constitutional:      Appearance: Normal appearance. She is obese.  Cardiovascular:     Rate and Rhythm: Normal rate and regular rhythm.     Pulses: Normal pulses.     Heart sounds: Normal heart sounds.  Pulmonary:     Effort: Pulmonary effort is normal.     Breath sounds: Normal breath sounds.  Abdominal:     General: Abdomen is flat. Bowel sounds are normal.     Palpations: Abdomen is soft.  Musculoskeletal:        General: Normal range of motion.     Cervical back: Normal range of motion and neck supple.  Skin:    General: Skin is warm and dry.     Capillary Refill: Capillary refill takes less than 2 seconds.     Coloration: Skin is not jaundiced.  Neurological:     General: No focal deficit present.     Mental Status: She is alert and oriented to person, place, and time.  Psychiatric:        Mood and Affect: Mood normal.        Behavior: Behavior normal.        Thought Content: Thought content normal.        Judgment: Judgment normal.         Assessment And Plan:     1. Type 2 diabetes mellitus with complication, without long-term current use of insulin (HCC)  Chronic, controlled  Continue with current medications  Encouraged to limit intake of sugary foods and drinks  Encouraged to increase physical activity to 150 minutes per week  Urine micro done  Eye exam - referral placed - POCT Urinalysis Dipstick (81002) - POCT UA - Microalbumin - metFORMIN (GLUCOPHAGE) 1000 MG tablet; TAKE 1 TABLET BY MOUTH TWICE DAILY WITH  A  MEAL.  Dispense: 180 tablet; Refill: 1 - Ambulatory referral to Ophthalmology - CMP14+EGFR - Hemoglobin A1c - valsartan-hydrochlorothiazide (DIOVAN-HCT) 160-25 MG tablet; Take 1 tablet by mouth daily.  Dispense: 90  tablet; Refill: 1  2. Essential hypertension . B/P is well controlled.  . CMP ordered to check renal function.  . The importance of regular exercise and dietary modification was stressed to the patient.  . Stressed importance of losing ten percent of her body weight to help with B/P control.  - CMP14+EGFR  3. Mixed hyperlipidemia  Chronic, controlled  Continue with current medications, tolerating well - Lipid panel  4. BMI 35.0-35.9,adult  She is encouraged to strive for BMI less than 30 to decrease cardiac risk. Advised to aim for at least 150 minutes of exercise per week.    Patient was given opportunity to ask questions. Patient verbalized understanding of the plan and was able to repeat key elements of the plan. All questions were answered to their satisfaction.  Minette Brine, FNP  Teola Bradley, FNP, have reviewed all documentation for this visit. The documentation on 06/08/20 for the exam, diagnosis, procedures, and orders are all accurate and complete.   THE PATIENT IS ENCOURAGED TO PRACTICE SOCIAL DISTANCING DUE TO THE COVID-19 PANDEMIC.

## 2020-06-03 NOTE — Patient Instructions (Signed)
Diabetes Mellitus Basics  Diabetes mellitus, or diabetes, is a long-term (chronic) disease. It occurs when the body does not properly use sugar (glucose) that is released from food after you eat. Diabetes mellitus may be caused by one or both of these problems:  Your pancreas does not make enough of a hormone called insulin.  Your body does not react in a normal way to the insulin that it makes. Insulin lets glucose enter cells in your body. This gives you energy. If you have diabetes, glucose cannot get into cells. This causes high blood glucose (hyperglycemia). How to treat and manage diabetes You may need to take insulin or other diabetes medicines daily to keep your glucose in balance. If you are prescribed insulin, you will learn how to give yourself insulin by injection. You may need to adjust the amount of insulin you take based on the foods that you eat. You will need to check your blood glucose levels using a glucose monitor as told by your health care provider. The readings can help determine if you have low or high blood glucose. Generally, you should have these blood glucose levels:  Before meals (preprandial): 80-130 mg/dL (4.4-7.2 mmol/L).  After meals (postprandial): below 180 mg/dL (10 mmol/L).  Hemoglobin A1c (HbA1c) level: less than 7%. Your health care provider will set treatment goals for you. Keep all follow-up visits. This is important. Follow these instructions at home: Diabetes medicines Take your diabetes medicines every day as told by your health care provider. List your diabetes medicines here:  Name of medicine: ______________________________ ? Amount (dose): _______________ Time (a.m./p.m.): _______________ Notes: ___________________________________  Name of medicine: ______________________________ ? Amount (dose): _______________ Time (a.m./p.m.): _______________ Notes: ___________________________________  Name of medicine:  ______________________________ ? Amount (dose): _______________ Time (a.m./p.m.): _______________ Notes: ___________________________________ Insulin If you use insulin, list the types of insulin you use here:  Insulin type: ______________________________ ? Amount (dose): _______________ Time (a.m./p.m.): _______________Notes: ___________________________________  Insulin type: ______________________________ ? Amount (dose): _______________ Time (a.m./p.m.): _______________ Notes: ___________________________________  Insulin type: ______________________________ ? Amount (dose): _______________ Time (a.m./p.m.): _______________ Notes: ___________________________________  Insulin type: ______________________________ ? Amount (dose): _______________ Time (a.m./p.m.): _______________ Notes: ___________________________________  Insulin type: ______________________________ ? Amount (dose): _______________ Time (a.m./p.m.): _______________ Notes: ___________________________________ Managing blood glucose Check your blood glucose levels using a glucose monitor as told by your health care provider. Write down the times that you check your glucose levels here:  Time: _______________ Notes: ___________________________________  Time: _______________ Notes: ___________________________________  Time: _______________ Notes: ___________________________________  Time: _______________ Notes: ___________________________________  Time: _______________ Notes: ___________________________________  Time: _______________ Notes: ___________________________________   Low blood glucose Low blood glucose (hypoglycemia) is when glucose is at or below 70 mg/dL (3.9 mmol/L). Symptoms may include:  Feeling: ? Hungry. ? Sweaty and clammy. ? Irritable or easily upset. ? Dizzy. ? Sleepy.  Having: ? A fast heartbeat. ? A headache. ? A change in your vision. ? Numbness around the mouth, lips, or  tongue.  Having trouble with: ? Moving (coordination). ? Sleeping. Treating low blood glucose To treat low blood glucose, eat or drink something containing sugar right away. If you can think clearly and swallow safely, follow the 15:15 rule:  Take 15 grams of a fast-acting carb (carbohydrate), as told by your health care provider.  Some fast-acting carbs are: ? Glucose tablets: take 3-4 tablets. ? Hard candy: eat 3-5 pieces. ? Fruit juice: drink 4 oz (120 mL). ? Regular (not diet) soda: drink 4-6 oz (120-180 mL). ? Honey or sugar:  eat 1 Tbsp (15 mL).  Check your blood glucose levels 15 minutes after you take the carb.  If your glucose is still at or below 70 mg/dL (3.9 mmol/L), take 15 grams of a carb again.  If your glucose does not go above 70 mg/dL (3.9 mmol/L) after 3 tries, get help right away.  After your glucose goes back to normal, eat a meal or a snack within 1 hour. Treating very low blood glucose If your glucose is at or below 54 mg/dL (3 mmol/L), you have very low blood glucose (severe hypoglycemia). This is an emergency. Do not wait to see if the symptoms will go away. Get medical help right away. Call your local emergency services (911 in the [place] Do not drive yourself to the hospital. Questions to ask your health care provider  Should I talk with a diabetes educator?  What equipment will I need to care for myself at home?  What diabetes medicines do I need? When should I take them?  How often do I need to check my blood glucose levels?  What number can I call if I have questions?  When is my follow-up visit?  Where can I find a support group for people with diabetes? Where to find more information  American Diabetes Association: www.diabetes.org  Association of Diabetes Care and Education Specialists: www.diabeteseducator.org Contact a health care provider if:  Your blood glucose is at or above 240 mg/dL (13.3 mmol/L) for 2 days in a row.  You have  been sick or have had a fever for 2 days or more, and you are not getting better.  You have any of these problems for more than 6 hours: ? You cannot eat or drink. ? You feel nauseous. ? You vomit. ? You have diarrhea. Get help right away if:  Your blood glucose is lower than 54 mg/dL (3 mmol/L).  You get confused.  You have trouble thinking clearly.  You have trouble breathing. These symptoms may represent a serious problem that is an emergency. Do not wait to see if the symptoms will go away. Get medical help right away. Call your local emergency services (911 in the [place] Do not drive yourself to the hospital. Summary  Diabetes mellitus is a chronic disease that occurs when the body does not properly use sugar (glucose) that is released from food after you eat.  Take insulin and diabetes medicines as told.  Check your blood glucose every day, as often as told.  Keep all follow-up visits. This is important. This information is not intended to replace advice given to you by your health care provider. Make sure you discuss any questions you have with your health care provider. Document Revised: 08/14/2019 Document Reviewed: 08/14/2019 Elsevier Patient Education  2021 [employer].

## 2020-06-04 LAB — CMP14+EGFR
ALT: 18 IU/L (ref 0–32)
AST: 15 IU/L (ref 0–40)
Albumin/Globulin Ratio: 1.4 (ref 1.2–2.2)
Albumin: 4.5 g/dL (ref 3.8–4.9)
Alkaline Phosphatase: 47 IU/L (ref 44–121)
BUN/Creatinine Ratio: 22 (ref 9–23)
BUN: 15 mg/dL (ref 6–24)
Bilirubin Total: 0.8 mg/dL (ref 0.0–1.2)
CO2: 28 mmol/L (ref 20–29)
Calcium: 10.4 mg/dL — ABNORMAL HIGH (ref 8.7–10.2)
Chloride: 93 mmol/L — ABNORMAL LOW (ref 96–106)
Creatinine, Ser: 0.69 mg/dL (ref 0.57–1.00)
GFR calc Af Amer: 116 mL/min/{1.73_m2} (ref >59)
GFR calc non Af Amer: 100 mL/min/{1.73_m2} (ref >59)
Globulin, Total: 3.2 g/dL (ref 1.5–4.5)
Glucose: 99 mg/dL (ref 65–99)
Potassium: 4.2 mmol/L (ref 3.5–5.2)
Sodium: 137 mmol/L (ref 134–144)
Total Protein: 7.7 g/dL (ref 6.0–8.5)

## 2020-06-04 LAB — LIPID PANEL
Chol/HDL Ratio: 4 ratio (ref 0.0–4.4)
Cholesterol, Total: 202 mg/dL — ABNORMAL HIGH (ref 100–199)
HDL: 51 mg/dL (ref >39)
LDL Chol Calc (NIH): 119 mg/dL — ABNORMAL HIGH (ref 0–99)
Triglycerides: 181 mg/dL — ABNORMAL HIGH (ref 0–149)
VLDL Cholesterol Cal: 32 mg/dL (ref 5–40)

## 2020-06-04 LAB — HEMOGLOBIN A1C
Est. average glucose Bld gHb Est-mCnc: 166 mg/dL
Hgb A1c MFr Bld: 7.4 % — ABNORMAL HIGH (ref 4.8–5.6)

## 2020-06-08 DIAGNOSIS — E782 Mixed hyperlipidemia: Secondary | ICD-10-CM | POA: Insufficient documentation

## 2020-07-29 ENCOUNTER — Encounter: Payer: Self-pay | Admitting: Nurse Practitioner

## 2020-09-01 ENCOUNTER — Ambulatory Visit: Payer: Self-pay | Admitting: Nurse Practitioner

## 2020-12-07 ENCOUNTER — Emergency Department (HOSPITAL_COMMUNITY): Payer: 59

## 2020-12-07 ENCOUNTER — Encounter: Payer: Self-pay | Admitting: Emergency Medicine

## 2020-12-07 ENCOUNTER — Emergency Department (HOSPITAL_COMMUNITY)
Admission: EM | Admit: 2020-12-07 | Discharge: 2020-12-08 | Disposition: A | Discharge status: Home / Self Care | Payer: 59 | Attending: Emergency Medicine | Admitting: Emergency Medicine

## 2020-12-07 ENCOUNTER — Encounter (HOSPITAL_COMMUNITY): Payer: Self-pay | Admitting: Emergency Medicine

## 2020-12-07 ENCOUNTER — Ambulatory Visit
Admission: EM | Admit: 2020-12-07 | Discharge: 2020-12-07 | Disposition: A | Discharge status: Home / Self Care | Payer: 59

## 2020-12-07 ENCOUNTER — Other Ambulatory Visit: Payer: Self-pay

## 2020-12-07 DIAGNOSIS — E119 Type 2 diabetes mellitus without complications: Secondary | ICD-10-CM | POA: Insufficient documentation

## 2020-12-07 DIAGNOSIS — R601 Generalized edema: Secondary | ICD-10-CM

## 2020-12-07 DIAGNOSIS — I10 Essential (primary) hypertension: Secondary | ICD-10-CM | POA: Insufficient documentation

## 2020-12-07 DIAGNOSIS — R5383 Other fatigue: Secondary | ICD-10-CM | POA: Diagnosis not present

## 2020-12-07 DIAGNOSIS — R2243 Localized swelling, mass and lump, lower limb, bilateral: Secondary | ICD-10-CM

## 2020-12-07 DIAGNOSIS — R609 Edema, unspecified: Secondary | ICD-10-CM

## 2020-12-07 DIAGNOSIS — Z79899 Other long term (current) drug therapy: Secondary | ICD-10-CM | POA: Diagnosis not present

## 2020-12-07 DIAGNOSIS — R600 Localized edema: Secondary | ICD-10-CM | POA: Insufficient documentation

## 2020-12-07 DIAGNOSIS — R0989 Other specified symptoms and signs involving the circulatory and respiratory systems: Secondary | ICD-10-CM | POA: Diagnosis not present

## 2020-12-07 DIAGNOSIS — Z7984 Long term (current) use of oral hypoglycemic drugs: Secondary | ICD-10-CM | POA: Diagnosis not present

## 2020-12-07 LAB — COMPREHENSIVE METABOLIC PANEL
ALT: 29 U/L (ref 0–44)
AST: 32 U/L (ref 15–41)
Albumin: 3.6 g/dL (ref 3.5–5.0)
Alkaline Phosphatase: 84 U/L (ref 38–126)
Anion gap: 10 (ref 5–15)
BUN: 19 mg/dL (ref 6–20)
CO2: 29 mmol/L (ref 22–32)
Calcium: 9.2 mg/dL (ref 8.9–10.3)
Chloride: 99 mmol/L (ref 98–111)
Creatinine, Ser: 1.01 mg/dL — ABNORMAL HIGH (ref 0.44–1.00)
GFR, Estimated: >60 mL/min (ref >60)
Glucose, Bld: 208 mg/dL — ABNORMAL HIGH (ref 70–99)
Potassium: 3.4 mmol/L — ABNORMAL LOW (ref 3.5–5.1)
Sodium: 138 mmol/L (ref 135–145)
Total Bilirubin: 2.6 mg/dL — ABNORMAL HIGH (ref 0.3–1.2)
Total Protein: 6.6 g/dL (ref 6.5–8.1)

## 2020-12-07 LAB — CBC WITH DIFFERENTIAL/PLATELET
Abs Immature Granulocytes: 0.03 10*3/uL (ref 0.00–0.07)
Basophils Absolute: 0 10*3/uL (ref 0.0–0.1)
Basophils Relative: 1 %
Eosinophils Absolute: 0.1 10*3/uL (ref 0.0–0.5)
Eosinophils Relative: 1 %
HCT: 40.7 % (ref 36.0–46.0)
Hemoglobin: 14 g/dL (ref 12.0–15.0)
Immature Granulocytes: 0 %
Lymphocytes Relative: 30 %
Lymphs Abs: 2.3 10*3/uL (ref 0.7–4.0)
MCH: 29.4 pg (ref 26.0–34.0)
MCHC: 34.4 g/dL (ref 30.0–36.0)
MCV: 85.3 fL (ref 80.0–100.0)
Monocytes Absolute: 0.8 10*3/uL (ref 0.1–1.0)
Monocytes Relative: 10 %
Neutro Abs: 4.4 10*3/uL (ref 1.7–7.7)
Neutrophils Relative %: 58 %
Platelets: 281 10*3/uL (ref 150–400)
RBC: 4.77 MIL/uL (ref 3.87–5.11)
RDW: 13.4 % (ref 11.5–15.5)
WBC: 7.6 10*3/uL (ref 4.0–10.5)
nRBC: 0 % (ref 0.0–0.2)

## 2020-12-07 LAB — TROPONIN I (HIGH SENSITIVITY)
Troponin I (High Sensitivity): 14 ng/L (ref <18)
Troponin I (High Sensitivity): 16 ng/L (ref <18)

## 2020-12-07 LAB — BRAIN NATRIURETIC PEPTIDE: B Natriuretic Peptide: 751.2 pg/mL — ABNORMAL HIGH (ref 0.0–100.0)

## 2020-12-07 NOTE — ED Triage Notes (Signed)
Pt sts increased swelling to bilateral legs and feet x 2 months that is progressing; pt sts some swelling in torso as well; denies SOB or seeing here PCP for same

## 2020-12-07 NOTE — ED Provider Notes (Signed)
Emergency Medicine Provider Triage Evaluation Note  Shannon [name] , a 55 y.o. female  was evaluated in triage.  Pt complains of gradual onset, constant, worsening, BLE swelling and abdominal swelling/tightness for the past month. Went to UC today and was sent here with concern for new onset CHF, no history of same. She has been trying to elevate her legs without success. She took an OTC water pill last night without relief. Denies SOB. No other complaints at this time.  Review of Systems  Positive: + BLE swelling Negative: - SOB  Physical Exam  BP (!) 162/113 (BP Location: Left Arm)   Pulse (!) 106   Temp 98 F (36.7 C) (Oral)   Resp 16   SpO2 93%  Gen:   Awake, no distress   Resp:  Normal effort  MSK:   Moves extremities without difficulty  Other:  2+ pitting edema bilaterally  Medical Decision Making  Medically screening exam initiated at 2:52 PM.  Appropriate orders placed.  Shannon [name] was informed that the remainder of the evaluation will be completed by another provider, this initial triage assessment does not replace that evaluation, and the importance of remaining in the ED until their evaluation is complete.  CHF workup.    Eustaquio Maize, PA-C 12/07/20 [place], DO 12/07/20 [id number]

## 2020-12-07 NOTE — Discharge Instructions (Addendum)
Please go to the hospital as soon as you leave urgent care for further evaluation and management.

## 2020-12-07 NOTE — ED Triage Notes (Signed)
Pt reports lower extremity swelling and generalized swelling all over x 1 month.  Denies SOB.  Reports intermittent pain to bilateral feet but denies at present.
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
## 2020-12-07 NOTE — ED Notes (Signed)
Patient says she doesn't want any more vitals done

## 2020-12-08 MED ORDER — VALSARTAN 160 MG PO TABS: 160.0000 mg | ORAL_TABLET | Freq: Every day | ORAL | 0 refills | Status: DC

## 2020-12-08 MED ORDER — POTASSIUM CHLORIDE CRYS ER 20 MEQ PO TBCR: 20.0000 meq | EXTENDED_RELEASE_TABLET | Freq: Every day | ORAL | 0 refills | Status: DC

## 2020-12-08 MED ORDER — FUROSEMIDE 20 MG PO TABS
20.0000 mg | ORAL_TABLET | Freq: Two times a day (BID) | ORAL | 0 refills | Status: DC
Start: 2020-12-08 — End: 2021-03-06

## 2020-12-08 NOTE — ED Notes (Signed)
Pt ambulated around ER, pt denied SOB and O2 sat ranged 96-98 on RA. EDP aware
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
## 2020-12-10 ENCOUNTER — Other Ambulatory Visit: Payer: Self-pay

## 2020-12-10 ENCOUNTER — Ambulatory Visit: Payer: 59 | Admitting: Nurse Practitioner

## 2020-12-10 ENCOUNTER — Encounter: Payer: Self-pay | Admitting: Nurse Practitioner

## 2020-12-10 VITALS — BP 132/80 | HR 76 | Temp 97.9°F | Ht 60.0 in | Wt 190.0 lb

## 2020-12-10 DIAGNOSIS — M25471 Effusion, right ankle: Secondary | ICD-10-CM | POA: Diagnosis not present

## 2020-12-10 DIAGNOSIS — R600 Localized edema: Secondary | ICD-10-CM

## 2020-12-10 DIAGNOSIS — M25472 Effusion, left ankle: Secondary | ICD-10-CM

## 2020-12-10 NOTE — Progress Notes (Signed)
I,Katawbba Wiggins,acting as a Education administrator for [employer], NP.,have documented all relevant documentation on the behalf of [employer], NP,as directed by  Bary Castilla, NP while in the presence of Bary Castilla, NP.  This visit occurred during the SARS-CoV-2 public health emergency.  Safety protocols were in place, including screening questions prior to the visit, additional usage of staff PPE, and extensive cleaning of exam room while observing appropriate contact time as indicated for disinfecting solutions.  Subjective:     Patient ID: Shannon [name] , female    DOB: [birth year] , 55 y.o.   MRN: [id number]   Chief Complaint  Patient presents with   Follow-up    HPI  The patient is here today for an emergency room follow up for swelling in her legs. She denies any other symptoms. She denies chest pain or SOB. She was put on lasix from the ED and advised to follow up with a cardiologist. Currently she is taking valsartan. HCTZ was taken off her med list by the ED physician.   Other Pertinent negatives include no arthralgias, chest pain, chills, congestion, coughing, fatigue, headaches, myalgias or nausea.    Past Medical History:  Diagnosis Date   Diabetes ([place])    Hypertension    Inflammatory myopathy    treated 2012   Wears glasses    reading     Family History  Problem Relation Age of Onset   Hypertension Mother    Diabetes Mother    Hypertension Father    Heart disease Other    Hypertension Other    Diabetes Other      Current Outpatient Medications:    atorvastatin (LIPITOR) 20 MG tablet, Take 1 tablet (20 mg total) by mouth daily., Disp: 90 tablet, Rfl: 1   cholecalciferol (VITAMIN D) 1000 UNITS tablet, Take 1,000 Units by mouth daily. , Disp: , Rfl:    furosemide (LASIX) 20 MG tablet, Take 1 tablet (20 mg total) by mouth 2 (two) times daily., Disp: 20 tablet, Rfl: 0   metFORMIN (GLUCOPHAGE) 1000 MG tablet, TAKE 1 TABLET BY MOUTH TWICE DAILY WITH   A  MEAL., Disp: 180 tablet, Rfl: 1   potassium chloride SA (KLOR-CON) 20 MEQ tablet, Take 1 tablet (20 mEq total) by mouth daily., Disp: 5 tablet, Rfl: 0   valsartan (DIOVAN) 160 MG tablet, Take 1 tablet (160 mg total) by mouth daily., Disp: 10 tablet, Rfl: 0   No Known Allergies   Review of Systems  Constitutional:  Negative for chills and fatigue.  HENT:  Negative for congestion.   Respiratory:  Negative for cough, shortness of breath and wheezing.   Cardiovascular:  Positive for leg swelling. Negative for chest pain and palpitations.       Bilateral legs and feet swelling  Gastrointestinal:  Positive for abdominal distention. Negative for diarrhea and nausea.  Musculoskeletal:  Negative for arthralgias and myalgias.  Neurological:  Negative for dizziness and headaches.    Today's Vitals   12/10/20 1546  BP: 132/80  Pulse: 76  Temp: 97.9 F (36.6 C)  TempSrc: Oral  Weight: 190 lb (86.2 kg)  Height: 5' (1.524 m)   Body mass index is 37.11 kg/m.  Wt Readings from Last 3 Encounters:  12/10/20 190 lb (86.2 kg)  06/03/20 180 lb 12.8 oz (82 kg)  03/03/20 177 lb (80.3 kg)    BP Readings from Last 3 Encounters:  12/10/20 132/80  12/08/20 (!) 158/109  12/07/20 (!) 138/93    Objective:  Physical  Exam Constitutional:      Appearance: Normal appearance.  HENT:     Head: Normocephalic and atraumatic.  Cardiovascular:     Rate and Rhythm: Normal rate and regular rhythm.     Pulses: Normal pulses.     Heart sounds: Normal heart sounds. No murmur heard. Pulmonary:     Effort: Pulmonary effort is normal.     Breath sounds: Normal breath sounds.  Musculoskeletal:     Right lower leg: 2+ Pitting Edema present.     Left lower leg: 2+ Pitting Edema present.  Skin:    General: Skin is warm and dry.     Capillary Refill: Capillary refill takes less than 2 seconds.  Neurological:     Mental Status: She is alert and oriented to person, place, and time.        Assessment And  Plan:     1. Edema of both ankles -Recently in the ED for increased swelling of both ankles and lower extremity. Denies SOB or chest pain. She also did report of recent weight gain in recent months. No dyspnea on exertion. -HCTZ stopped in the ED; pt only taking Valsartan  -Added lasix 20 mg BID.  -Given potassium supplement -Will send for Echo and referral for cardiologist with concern of CHF   - Ambulatory referral to Cardiology - ECHOCARDIOGRAM COMPLETE; Future  2. Edema of left lower extremity -Recently in the ED for increased swelling of both ankles and lower extremity. Denies SOB or chest pain. She also did report of recent weight gain in recent months. No dyspnea on exertion. -HCTZ stopped in the ED; pt only taking Valsartan  -Added lasix 20 mg BID.  -Given potassium supplement -Will send for Echo and referral for cardiologist with concern of CHF   - Ambulatory referral to Cardiology - Ambulatory referral to Cardiology - ECHOCARDIOGRAM COMPLETE; Future   The patient was encouraged to call or send a message through [place] for any questions or concerns.   Follow up: if symptoms persist or do not get better.   Side effects and appropriate use of all the medication(s) were discussed with the patient today. Patient advised to use the medication(s) as directed by their healthcare provider. The patient was encouraged to read, review, and understand all associated package inserts and contact our office with any questions or concerns. The patient accepts the risks of the treatment plan and had an opportunity to ask questions.   Patient was given opportunity to ask questions. Patient verbalized understanding of the plan and was able to repeat key elements of the plan. All questions were answered to their satisfaction.  Raman Maxime Beckner, DNP   I, Raman Jaz Laningham have reviewed all documentation for this visit. The documentation on 12/10/20 for the exam, diagnosis, procedures, and orders are all  accurate and complete.    IF YOU HAVE BEEN REFERRED TO A SPECIALIST, IT MAY TAKE 1-2 WEEKS TO SCHEDULE/PROCESS THE REFERRAL. IF YOU HAVE NOT HEARD FROM US/SPECIALIST IN TWO WEEKS, PLEASE GIVE [place] A CALL AT [phone].   THE PATIENT IS ENCOURAGED TO PRACTICE SOCIAL DISTANCING DUE TO THE COVID-19 PANDEMIC.

## 2020-12-10 NOTE — Patient Instructions (Signed)
Edema  Edema is when you have too much fluid in your body or under your skin. Edema may make your legs, feet, and ankles swell up. Swelling is also common in looser tissues, like around your eyes. This is a common condition. It gets more common as you get older. There are many possible causes of edema. Eating too much salt (sodium) and being on your feet or sitting for a long time can cause edema in yourlegs, feet, and ankles. Hot weather may make edema worse. Edema is usually painless. Your skin may look swollen or shiny. Follow these instructions at home: Keep the swollen body part raised (elevated) above the level of your heart when you are sitting or lying down. Do not sit still or stand for a long time. Do not wear tight clothes. Do not wear garters on your upper legs. Exercise your legs. This can help the swelling go down. Wear elastic bandages or support stockings as told by your doctor. Eat a low-salt (low-sodium) diet to reduce fluid as told by your doctor. Depending on the cause of your swelling, you may need to limit how much fluid you drink (fluid restriction). Take over-the-counter and prescription medicines only as told by your doctor. Contact a doctor if: Treatment is not working. You have heart, liver, or kidney disease and have symptoms of edema. You have sudden and unexplained weight gain. Get help right away if: You have shortness of breath or chest pain. You cannot breathe when you lie down. You have pain, redness, or warmth in the swollen areas. You have heart, liver, or kidney disease and get edema all of a sudden. You have a fever and your symptoms get worse all of a sudden. Summary Edema is when you have too much fluid in your body or under your skin. Edema may make your legs, feet, and ankles swell up. Swelling is also common in looser tissues, like around your eyes. Raise (elevate) the swollen body part above the level of your heart when you are sitting or lying  down. Follow your doctor's instructions about diet and how much fluid you can drink (fluid restriction). This information is not intended to replace advice given to you by your health care provider. Make sure you discuss any questions you have with your healthcare provider. Document Revised: 02/06/2020 Document Reviewed: 02/06/2020 Elsevier Patient Education  2022 [employer].

## 2020-12-11 ENCOUNTER — Ambulatory Visit: Payer: Self-pay | Admitting: Nurse Practitioner

## 2020-12-17 ENCOUNTER — Other Ambulatory Visit: Payer: Self-pay | Admitting: Nurse Practitioner

## 2021-01-21 ENCOUNTER — Other Ambulatory Visit (HOSPITAL_COMMUNITY): Payer: 59

## 2021-01-27 ENCOUNTER — Ambulatory Visit: Payer: 59 | Admitting: Cardiology

## 2021-03-04 ENCOUNTER — Encounter: Payer: Self-pay | Admitting: Nurse Practitioner

## 2021-03-05 ENCOUNTER — Encounter (HOSPITAL_BASED_OUTPATIENT_CLINIC_OR_DEPARTMENT_OTHER): Payer: Self-pay

## 2021-03-05 ENCOUNTER — Ambulatory Visit (INDEPENDENT_AMBULATORY_CARE_PROVIDER_SITE_OTHER): Payer: 59 | Admitting: Nurse Practitioner

## 2021-03-05 ENCOUNTER — Other Ambulatory Visit (HOSPITAL_COMMUNITY)
Admission: RE | Admit: 2021-03-05 | Discharge: 2021-03-05 | Disposition: A | Discharge status: Home / Self Care | Payer: 59 | Source: Ambulatory Visit | Attending: Nurse Practitioner | Admitting: Nurse Practitioner

## 2021-03-05 ENCOUNTER — Ambulatory Visit (HOSPITAL_BASED_OUTPATIENT_CLINIC_OR_DEPARTMENT_OTHER): Payer: 59

## 2021-03-05 ENCOUNTER — Encounter: Payer: Self-pay | Admitting: Nurse Practitioner

## 2021-03-05 ENCOUNTER — Other Ambulatory Visit: Payer: Self-pay

## 2021-03-05 ENCOUNTER — Telehealth: Payer: Self-pay

## 2021-03-05 ENCOUNTER — Ambulatory Visit (HOSPITAL_BASED_OUTPATIENT_CLINIC_OR_DEPARTMENT_OTHER)
Admission: RE | Admit: 2021-03-05 | Discharge: 2021-03-05 | Disposition: A | Discharge status: Home / Self Care | Payer: 59 | Source: Ambulatory Visit | Attending: Nurse Practitioner | Admitting: Nurse Practitioner

## 2021-03-05 ENCOUNTER — Telehealth: Payer: Self-pay | Admitting: Internal Medicine

## 2021-03-05 VITALS — BP 114/78 | HR 86 | Temp 97.9°F | Ht 60.0 in | Wt 186.6 lb

## 2021-03-05 DIAGNOSIS — Z0000 Encounter for general adult medical examination without abnormal findings: Secondary | ICD-10-CM | POA: Diagnosis not present

## 2021-03-05 DIAGNOSIS — Z124 Encounter for screening for malignant neoplasm of cervix: Secondary | ICD-10-CM | POA: Diagnosis not present

## 2021-03-05 DIAGNOSIS — M25472 Effusion, left ankle: Secondary | ICD-10-CM | POA: Insufficient documentation

## 2021-03-05 DIAGNOSIS — M25471 Effusion, right ankle: Secondary | ICD-10-CM | POA: Insufficient documentation

## 2021-03-05 DIAGNOSIS — I50810 Right heart failure, unspecified: Secondary | ICD-10-CM | POA: Diagnosis not present

## 2021-03-05 DIAGNOSIS — Z114 Encounter for screening for human immunodeficiency virus [HIV]: Secondary | ICD-10-CM

## 2021-03-05 DIAGNOSIS — Z79899 Other long term (current) drug therapy: Secondary | ICD-10-CM

## 2021-03-05 DIAGNOSIS — I10 Essential (primary) hypertension: Secondary | ICD-10-CM | POA: Diagnosis not present

## 2021-03-05 DIAGNOSIS — R600 Localized edema: Secondary | ICD-10-CM

## 2021-03-05 DIAGNOSIS — R931 Abnormal findings on diagnostic imaging of heart and coronary circulation: Secondary | ICD-10-CM | POA: Insufficient documentation

## 2021-03-05 DIAGNOSIS — E118 Type 2 diabetes mellitus with unspecified complications: Secondary | ICD-10-CM | POA: Diagnosis not present

## 2021-03-05 DIAGNOSIS — Z23 Encounter for immunization: Secondary | ICD-10-CM | POA: Diagnosis not present

## 2021-03-05 LAB — POCT UA - MICROALBUMIN
Albumin/Creatinine Ratio, Urine, POC: 300
Creatinine, POC: 200 mg/dL
Microalbumin Ur, POC: 150 mg/L

## 2021-03-05 LAB — POCT URINALYSIS DIPSTICK
Glucose, UA: NEGATIVE
Ketones, UA: NEGATIVE
Leukocytes, UA: NEGATIVE
Nitrite, UA: NEGATIVE
Protein, UA: POSITIVE — AB
Spec Grav, UA: 1.025 (ref 1.010–1.025)
Urobilinogen, UA: 4 E.U./dL — AB
pH, UA: 5.5 (ref 5.0–8.0)

## 2021-03-05 LAB — POCT I-STAT CREATININE: Creatinine, Ser: 0.9 mg/dL (ref 0.44–1.00)

## 2021-03-05 LAB — ECHOCARDIOGRAM COMPLETE: S' Lateral: 2.3 cm

## 2021-03-05 MED ORDER — IOHEXOL 350 MG/ML SOLN
100.0000 mL | Freq: Once | INTRAVENOUS | Status: AC | PRN
  Administered 2021-03-05: 100 mL via INTRAVENOUS

## 2021-03-05 NOTE — Telephone Encounter (Signed)
Reviewed CT results with pt   No evidence of PE PA is enlarged consistent with pulmonary hypertension Recomm she keep appt with Dr Radford Pax tomorrow.

## 2021-03-05 NOTE — Progress Notes (Signed)
I,Katawbba Wiggins,acting as a Education administrator for [employer], FNP.,have documented all relevant documentation on the behalf of Minette Brine, FNP,as directed by  Minette Brine, FNP while in the presence of Minette Brine, [place].   This visit occurred during the SARS-CoV-2 public health emergency.  Safety protocols were in place, including screening questions prior to the visit, additional usage of staff PPE, and extensive cleaning of exam room while observing appropriate contact time as indicated for disinfecting solutions.  Subjective:     Patient ID: Shannon [name] , female    DOB: 1965-04-28 , 55 y.o.   MRN: [id number]   Chief Complaint  Patient presents with   Annual Exam    HPI  Patient here for HM.  Wt Readings from Last 3 Encounters: 03/05/21 : 186 lb 9.6 oz (84.6 kg) 12/10/20 : 190 lb (86.2 kg) 06/03/20 : 180 lb 12.8 oz (82 kg)  Received message to call Dr. Dorris Carnes in reference to her abnormal ECHO - RV was low and PA is    Diabetes There are no hypoglycemic associated symptoms. Pertinent negatives for diabetes include no chest pain. There are no hypoglycemic complications. There are no diabetic complications. Risk factors for coronary artery disease include obesity and sedentary lifestyle. Current diabetic treatment includes oral agent (monotherapy). She is following a generally unhealthy diet. She has not had a previous visit with a dietitian. She rarely participates in exercise. She does not see a podiatrist.Eye exam current: appt is tomorrow.    Past Medical History:  Diagnosis Date   Diabetes ([place])    Hypertension    Inflammatory myopathy    treated 2012   Wears glasses    reading     Family History  Problem Relation Age of Onset   Hypertension Mother    Diabetes Mother    Hypertension Father    Heart disease Other    Hypertension Other    Diabetes Other      Current Outpatient Medications:    atorvastatin (LIPITOR) 20 MG tablet, Take 1 tablet (20 mg total)  by mouth daily., Disp: 90 tablet, Rfl: 1   cholecalciferol (VITAMIN D) 1000 UNITS tablet, Take 1,000 Units by mouth daily. , Disp: , Rfl:    metFORMIN (GLUCOPHAGE) 1000 MG tablet, TAKE 1 TABLET BY MOUTH TWICE DAILY WITH  A  MEAL., Disp: 180 tablet, Rfl: 1   furosemide (LASIX) 20 MG tablet, Take 1 tablet (20 mg total) by mouth daily., Disp: 90 tablet, Rfl: 3   potassium chloride SA (KLOR-CON) 20 MEQ tablet, Take 1 tablet (20 mEq total) by mouth daily., Disp: 90 tablet, Rfl: 3   valsartan (DIOVAN) 160 MG tablet, Take 1 tablet (160 mg total) by mouth daily., Disp: 90 tablet, Rfl: 3   No Known Allergies    The patient states she uses post menopausal status for birth control. Last LMP was No LMP recorded. (Menstrual status: Irregular Periods).. Negative for Dysmenorrhea and Negative for Menorrhagia. Negative for: breast discharge, breast lump(s), breast pain and breast self exam. Associated symptoms include abnormal vaginal bleeding. Pertinent negatives include abnormal bleeding (hematology), anxiety, decreased libido, depression, difficulty falling sleep, dyspareunia, history of infertility, nocturia, sexual dysfunction, sleep disturbances, urinary incontinence, urinary urgency, vaginal discharge and vaginal itching. Diet regular; she admits to eating a healthy diet. The patient states her exercise level is minimal.   The patient's tobacco use is:  Social History   Tobacco Use  Smoking Status Never  Smokeless Tobacco Never  . She has been exposed  to passive smoke. The patient's alcohol use is:  Social History   Substance and Sexual Activity  Alcohol Use No   Additional information: Last pap 02/24/2018, next one scheduled for 03/05/2021.    Review of Systems  Constitutional: Negative.   HENT: Negative.    Eyes: Negative.   Respiratory: Negative.    Cardiovascular: Negative.  Negative for chest pain.  Gastrointestinal: Negative.   Endocrine: Negative.   Genitourinary: Negative.    Musculoskeletal: Negative.   Skin: Negative.   Allergic/Immunologic: Negative.   Neurological: Negative.   Hematological: Negative.   Psychiatric/Behavioral: Negative.      Today's Vitals   03/05/21 1445  BP: 114/78  Pulse: 86  Temp: 97.9 F (36.6 C)  Weight: 186 lb 9.6 oz (84.6 kg)  Height: 5' (1.524 m)   Body mass index is 36.44 kg/m.   Objective:  Physical Exam Constitutional:      General: She is not in acute distress.    Appearance: Normal appearance. She is well-developed. She is obese.  HENT:     Head: Normocephalic and atraumatic.     Right Ear: Hearing, tympanic membrane, ear canal and external ear normal. There is no impacted cerumen.     Left Ear: Hearing, tympanic membrane, ear canal and external ear normal. There is no impacted cerumen.     Nose:     Comments: Deferred - masked    Mouth/Throat:     Comments: Deferred - masked Eyes:     General: Lids are normal.     Extraocular Movements: Extraocular movements intact.     Conjunctiva/sclera: Conjunctivae normal.     Pupils: Pupils are equal, round, and reactive to light.     Funduscopic exam:    Right eye: No papilledema.        Left eye: No papilledema.  Neck:     Thyroid: No thyroid mass.     Vascular: No carotid bruit.  Cardiovascular:     Rate and Rhythm: Normal rate and regular rhythm.     Pulses: Normal pulses.     Heart sounds: Normal heart sounds. No murmur heard. Pulmonary:     Effort: Pulmonary effort is normal.     Breath sounds: Normal breath sounds.  Chest:     Chest wall: No mass.  Breasts:    Tanner Score is 5.     Right: Normal. No mass or tenderness.     Left: Normal. No mass or tenderness.  Abdominal:     General: Abdomen is flat. Bowel sounds are normal. There is no distension.     Palpations: Abdomen is soft.     Tenderness: There is no abdominal tenderness.  Genitourinary:    General: Normal vulva.     Vagina: No vaginal discharge.     Rectum: Guaiac result negative.   Musculoskeletal:        General: No swelling. Normal range of motion.     Cervical back: Full passive range of motion without pain, normal range of motion and neck supple.     Right lower leg: No edema.     Left lower leg: No edema.  Lymphadenopathy:     Upper Body:     Right upper body: No supraclavicular, axillary or pectoral adenopathy.     Left upper body: No supraclavicular, axillary or pectoral adenopathy.  Skin:    General: Skin is warm and dry.     Capillary Refill: Capillary refill takes less than 2 seconds.  Neurological:  General: No focal deficit present.     Mental Status: She is alert and oriented to person, place, and time.     Cranial Nerves: No cranial nerve deficit.     Sensory: No sensory deficit.  Psychiatric:        Mood and Affect: Mood normal.        Behavior: Behavior normal.        Thought Content: Thought content normal.        Judgment: Judgment normal.        Assessment And Plan:     1. Encounter for general adult medical examination w/o abnormal findings  2. Essential hypertension Comments: good control, continue current medications  3. Type 2 diabetes mellitus with complication, without long-term current use of insulin (HCC) Comments: Stable continue current medications - Hemoglobin A1c - CMP14+EGFR - Lipid panel - POCT Urinalysis Dipstick (81002) - POCT UA - Microalbumin  4. Abnormal echocardiogram Comments: Ordered due to concerns about PE after speaking with Cardiologist - CT Angio Chest W/Cm &/Or Wo Cm; Future - D-dimer, quantitative  5. Encounter for Papanicolaou smear of cervix Comments: No abnormal findings on physical exam - Cytology -Pap Smear  6. Other long term (current) drug therapy - CBC  7. Encounter for HIV (human immunodeficiency virus) test    Patient was given opportunity to ask questions. Patient verbalized understanding of the plan and was able to repeat key elements of the plan. All questions were answered  to their satisfaction.   Minette Brine, FNP   I, Minette Brine, FNP, have reviewed all documentation for this visit. The documentation on 03/05/21 for the exam, diagnosis, procedures, and orders are all accurate and complete.   THE PATIENT IS ENCOURAGED TO PRACTICE SOCIAL DISTANCING DUE TO THE COVID-19 PANDEMIC.

## 2021-03-05 NOTE — Telephone Encounter (Signed)
Pt to have a CT today at the [facility] o r/o PE.   Precert done.

## 2021-03-05 NOTE — Progress Notes (Unsigned)
Consulted with DOD, Dr. Harrington Challenger. Patient was placed in the care of Dr. Harrington Challenger.

## 2021-03-05 NOTE — Patient Instructions (Signed)

## 2021-03-06 ENCOUNTER — Ambulatory Visit: Payer: 59 | Admitting: Cardiology

## 2021-03-06 ENCOUNTER — Encounter: Payer: Self-pay | Admitting: Cardiology

## 2021-03-06 VITALS — BP 118/72 | HR 76 | Ht 60.0 in | Wt 187.8 lb

## 2021-03-06 DIAGNOSIS — I2781 Cor pulmonale (chronic): Secondary | ICD-10-CM

## 2021-03-06 DIAGNOSIS — I10 Essential (primary) hypertension: Secondary | ICD-10-CM | POA: Diagnosis not present

## 2021-03-06 DIAGNOSIS — I50810 Right heart failure, unspecified: Secondary | ICD-10-CM

## 2021-03-06 DIAGNOSIS — R600 Localized edema: Secondary | ICD-10-CM

## 2021-03-06 DIAGNOSIS — I272 Pulmonary hypertension, unspecified: Secondary | ICD-10-CM

## 2021-03-06 LAB — LIPID PANEL
Chol/HDL Ratio: 3.4 ratio (ref 0.0–4.4)
Cholesterol, Total: 127 mg/dL (ref 100–199)
HDL: 37 mg/dL — ABNORMAL LOW (ref >39)
LDL Chol Calc (NIH): 70 mg/dL (ref 0–99)
Triglycerides: 109 mg/dL (ref 0–149)
VLDL Cholesterol Cal: 20 mg/dL (ref 5–40)

## 2021-03-06 LAB — CBC
Hematocrit: 42.5 % (ref 34.0–46.6)
Hemoglobin: 14.2 g/dL (ref 11.1–15.9)
MCH: 30 pg (ref 26.6–33.0)
MCHC: 33.4 g/dL (ref 31.5–35.7)
MCV: 90 fL (ref 79–97)
Platelets: 408 10*3/uL (ref 150–450)
RBC: 4.73 x10E6/uL (ref 3.77–5.28)
RDW: 14.8 % (ref 11.7–15.4)
WBC: 9.7 10*3/uL (ref 3.4–10.8)

## 2021-03-06 LAB — CMP14+EGFR
ALT: 53 IU/L — ABNORMAL HIGH (ref 0–32)
AST: 40 IU/L (ref 0–40)
Albumin/Globulin Ratio: 1.3 (ref 1.2–2.2)
Albumin: 4 g/dL (ref 3.8–4.9)
Alkaline Phosphatase: 243 IU/L — ABNORMAL HIGH (ref 44–121)
BUN/Creatinine Ratio: 21 (ref 9–23)
BUN: 19 mg/dL (ref 6–24)
Bilirubin Total: 2.2 mg/dL — ABNORMAL HIGH (ref 0.0–1.2)
CO2: 27 mmol/L (ref 20–29)
Calcium: 9.9 mg/dL (ref 8.7–10.2)
Chloride: 100 mmol/L (ref 96–106)
Creatinine, Ser: 0.9 mg/dL (ref 0.57–1.00)
Globulin, Total: 3.1 g/dL (ref 1.5–4.5)
Glucose: 108 mg/dL — ABNORMAL HIGH (ref 70–99)
Potassium: 4.3 mmol/L (ref 3.5–5.2)
Sodium: 145 mmol/L — ABNORMAL HIGH (ref 134–144)
Total Protein: 7.1 g/dL (ref 6.0–8.5)
eGFR: 76 mL/min/{1.73_m2} (ref >59)

## 2021-03-06 LAB — HEMOGLOBIN A1C
Est. average glucose Bld gHb Est-mCnc: 143 mg/dL
Hgb A1c MFr Bld: 6.6 % — ABNORMAL HIGH (ref 4.8–5.6)

## 2021-03-06 LAB — D-DIMER, QUANTITATIVE: D-DIMER: 0.74 mg/L FEU — ABNORMAL HIGH (ref 0.00–0.49)

## 2021-03-06 MED ORDER — FUROSEMIDE 20 MG PO TABS: 20.0000 mg | ORAL_TABLET | Freq: Every day | ORAL | 3 refills | Status: DC

## 2021-03-06 MED ORDER — POTASSIUM CHLORIDE CRYS ER 20 MEQ PO TBCR: 20.0000 meq | EXTENDED_RELEASE_TABLET | Freq: Every day | ORAL | 3 refills | Status: DC

## 2021-03-06 NOTE — Addendum Note (Signed)
Addended by: Antonieta Iba on: 03/06/2021 10:36 AM   Modules accepted: Orders

## 2021-03-06 NOTE — Patient Instructions (Signed)
Medication Instructions:  Your physician has recommended you make the following change in your medication:  1) RESTART Lasix 20 mg daily  2) RESTART Potassium chloride (Kdur) 20 meq daily  *If you need a refill on your cardiac medications before your next appointment, please call your pharmacy*   Lab Work: TODAY: ANA, Rheumatoid factor, CRP, ESR, CPK  If you have labs (blood work) drawn today and your tests are completely normal, you will receive your results only by: [place] (if you have MyChart) OR A paper copy in the mail If you have any lab test that is abnormal or we need to change your treatment, we will call you to review the results.   Testing/Procedures: Your physician has recommended that you have a pulmonary function test. Pulmonary Function Tests are a group of tests that measure how well air moves in and out of your lungs.  Your physician has recommended that you have a ventilation-perfusion (VQ) scan.   Your physician has recommended that you have a sleep study. This test records several body functions during sleep, including: brain activity, eye movement, oxygen and carbon dioxide blood levels, heart rate and rhythm, breathing rate and rhythm, the flow of air through your mouth and nose, snoring, body muscle movements, and chest and belly movement.  Follow-Up: At [facility], you and your health needs are our priority.  As part of our continuing mission to provide you with exceptional heart care, we have created designated Provider Care Teams.  These Care Teams include your primary Cardiologist (physician) and Advanced Practice Providers (APPs -  Physician Assistants and Nurse Practitioners) who all work together to provide you with the care you need, when you need it.  Follow up with Dr. Radford Pax based on results of testing.   Other Instructions You have been referred to see Dr. [name] at the [facility]

## 2021-03-06 NOTE — Progress Notes (Signed)
I know she is coming to see you all tomorrow
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
## 2021-03-07 LAB — SEDIMENTATION RATE: Sed Rate: 12 mm/hr (ref 0–40)

## 2021-03-07 LAB — ANA: Anti Nuclear Antibody (ANA): NEGATIVE

## 2021-03-07 LAB — HIGH SENSITIVITY CRP: CRP, High Sensitivity: 26.02 mg/L — ABNORMAL HIGH (ref 0.00–3.00)

## 2021-03-07 LAB — RHEUMATOID FACTOR: Rheumatoid fact SerPl-aCnc: <10 IU/mL (ref <14.0)

## 2021-03-07 LAB — CK: Total CK: 252 U/L — ABNORMAL HIGH (ref 32–182)

## 2021-03-09 MED ORDER — VALSARTAN 160 MG PO TABS: 160.0000 mg | ORAL_TABLET | Freq: Every day | ORAL | 3 refills | Status: DC

## 2021-03-10 ENCOUNTER — Other Ambulatory Visit: Payer: Self-pay | Admitting: Nurse Practitioner

## 2021-03-10 DIAGNOSIS — R7982 Elevated C-reactive protein (CRP): Secondary | ICD-10-CM

## 2021-03-10 DIAGNOSIS — M609 Myositis, unspecified: Secondary | ICD-10-CM

## 2021-03-10 NOTE — Progress Notes (Signed)
Make patient aware we are placing referral to Rheumatology

## 2021-03-12 ENCOUNTER — Encounter: Payer: Self-pay | Admitting: Nurse Practitioner

## 2021-03-12 LAB — CYTOLOGY - PAP
Comment: NEGATIVE
Diagnosis: UNDETERMINED — AB
High risk HPV: NEGATIVE

## 2021-03-13 ENCOUNTER — Telehealth: Payer: Self-pay | Admitting: *Deleted

## 2021-03-13 NOTE — Telephone Encounter (Signed)
-----  Message from Antonieta Iba, RN sent at 03/06/2021 11:44 AM EST ----- PSG sleep study has been ordered.  Thanks!

## 2021-03-13 NOTE — Telephone Encounter (Signed)
Prior Authorization for PSG sent to [facility] via web portal. Notification/Prior Authorization is required for one or more of the procedures you have entered. Decision ID #:[id number] The notification/prior authorization reference number is [id number]

## 2021-03-20 ENCOUNTER — Encounter (HOSPITAL_COMMUNITY)
Admission: RE | Admit: 2021-03-20 | Discharge: 2021-03-20 | Disposition: A | Discharge status: Home / Self Care | Payer: 59 | Source: Ambulatory Visit | Attending: Cardiology | Admitting: Cardiology

## 2021-03-20 ENCOUNTER — Other Ambulatory Visit: Payer: Self-pay

## 2021-03-20 ENCOUNTER — Ambulatory Visit (HOSPITAL_COMMUNITY)
Admission: RE | Admit: 2021-03-20 | Discharge: 2021-03-20 | Disposition: A | Discharge status: Home / Self Care | Payer: 59 | Source: Ambulatory Visit | Attending: Cardiology | Admitting: Cardiology

## 2021-03-20 DIAGNOSIS — I10 Essential (primary) hypertension: Secondary | ICD-10-CM | POA: Insufficient documentation

## 2021-03-20 DIAGNOSIS — I272 Pulmonary hypertension, unspecified: Secondary | ICD-10-CM | POA: Insufficient documentation

## 2021-03-20 DIAGNOSIS — I2781 Cor pulmonale (chronic): Secondary | ICD-10-CM | POA: Insufficient documentation

## 2021-03-20 DIAGNOSIS — I50810 Right heart failure, unspecified: Secondary | ICD-10-CM | POA: Insufficient documentation

## 2021-03-20 DIAGNOSIS — R600 Localized edema: Secondary | ICD-10-CM | POA: Insufficient documentation

## 2021-03-20 IMAGING — CR DG CHEST 2V
2 series · 2 of 2 positions shown · non-contrast
Comparison: [DATE]

CLINICAL DATA: Right heart failure with reduced right ventricular
function.

EXAM:
CHEST - 2 VIEW

[chest pa]
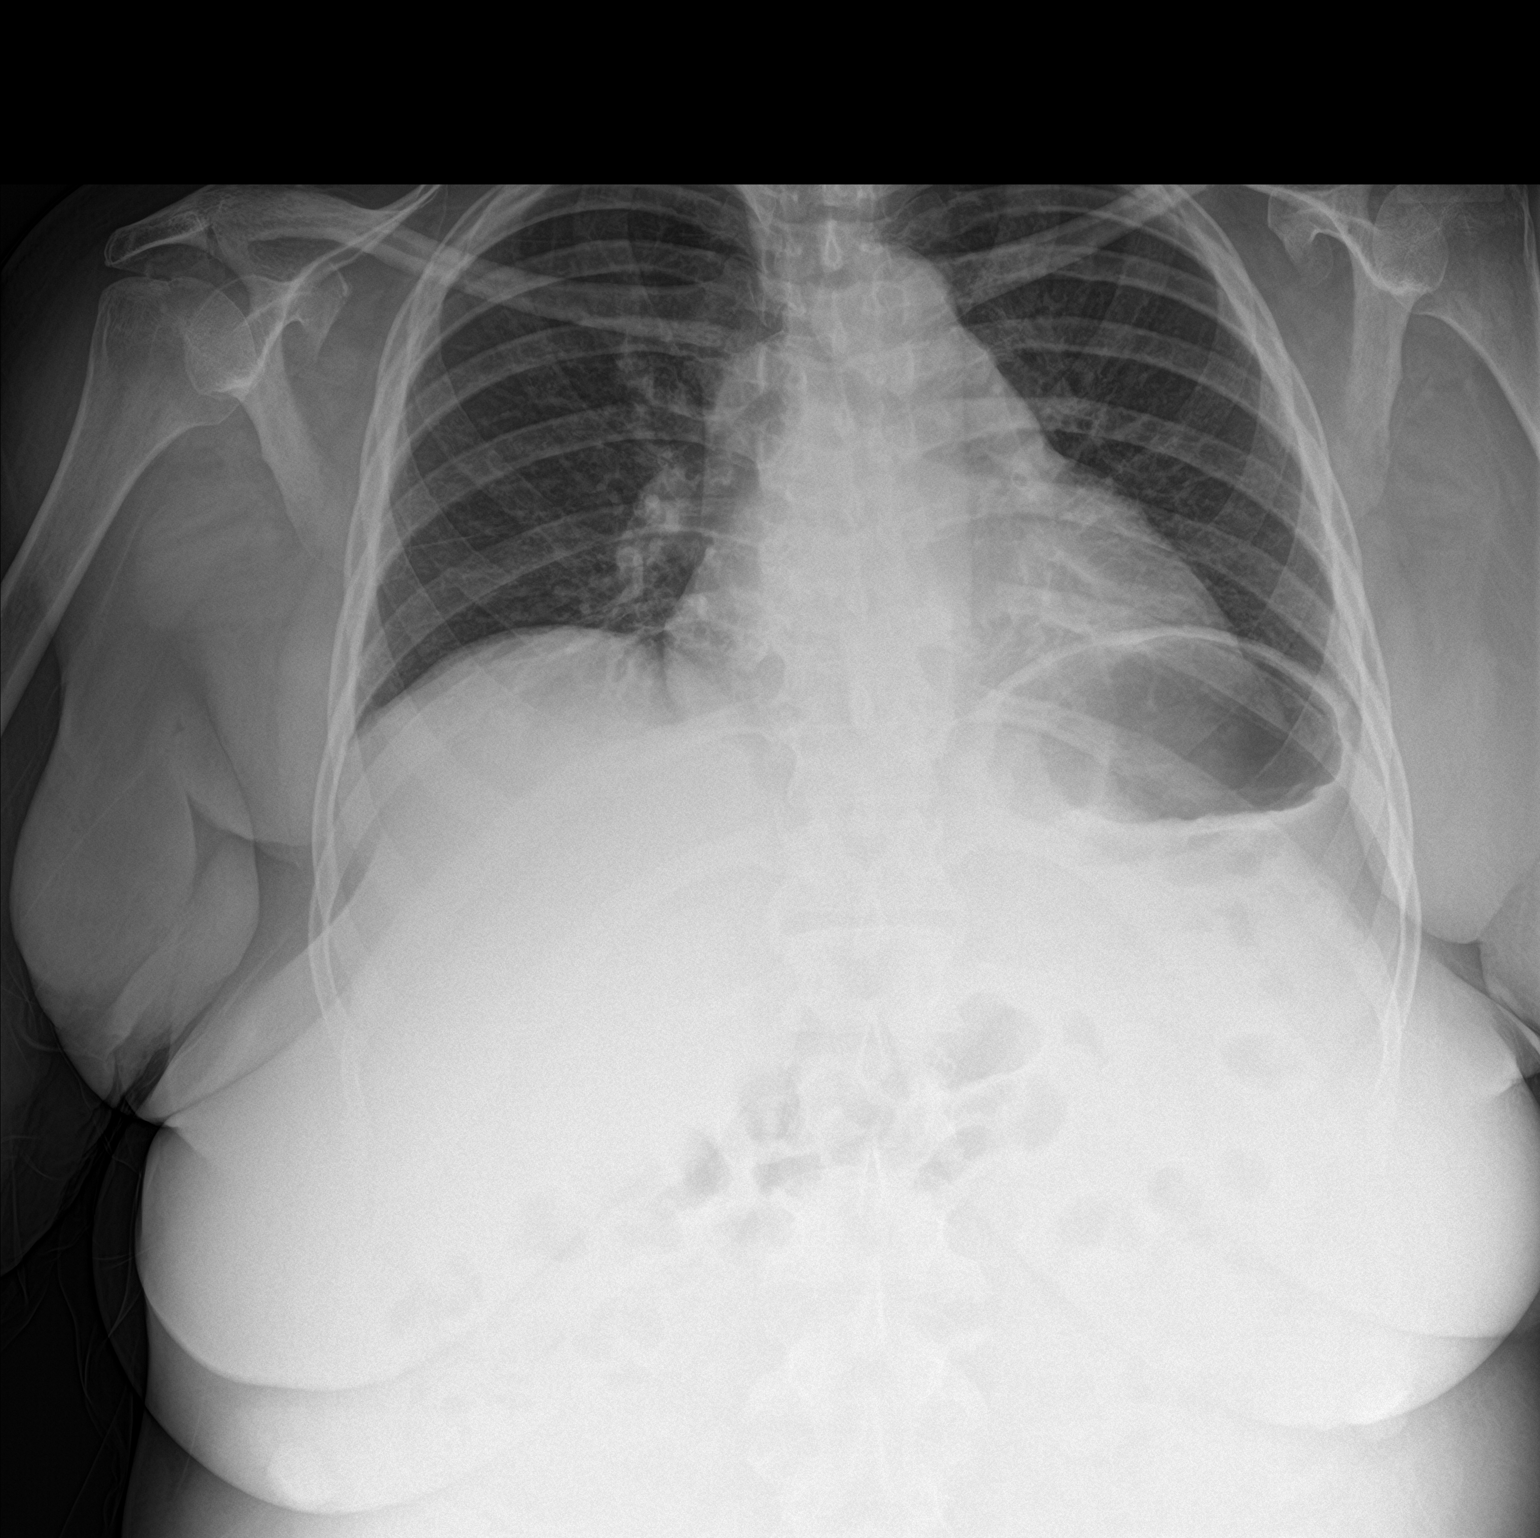

[chest lat]
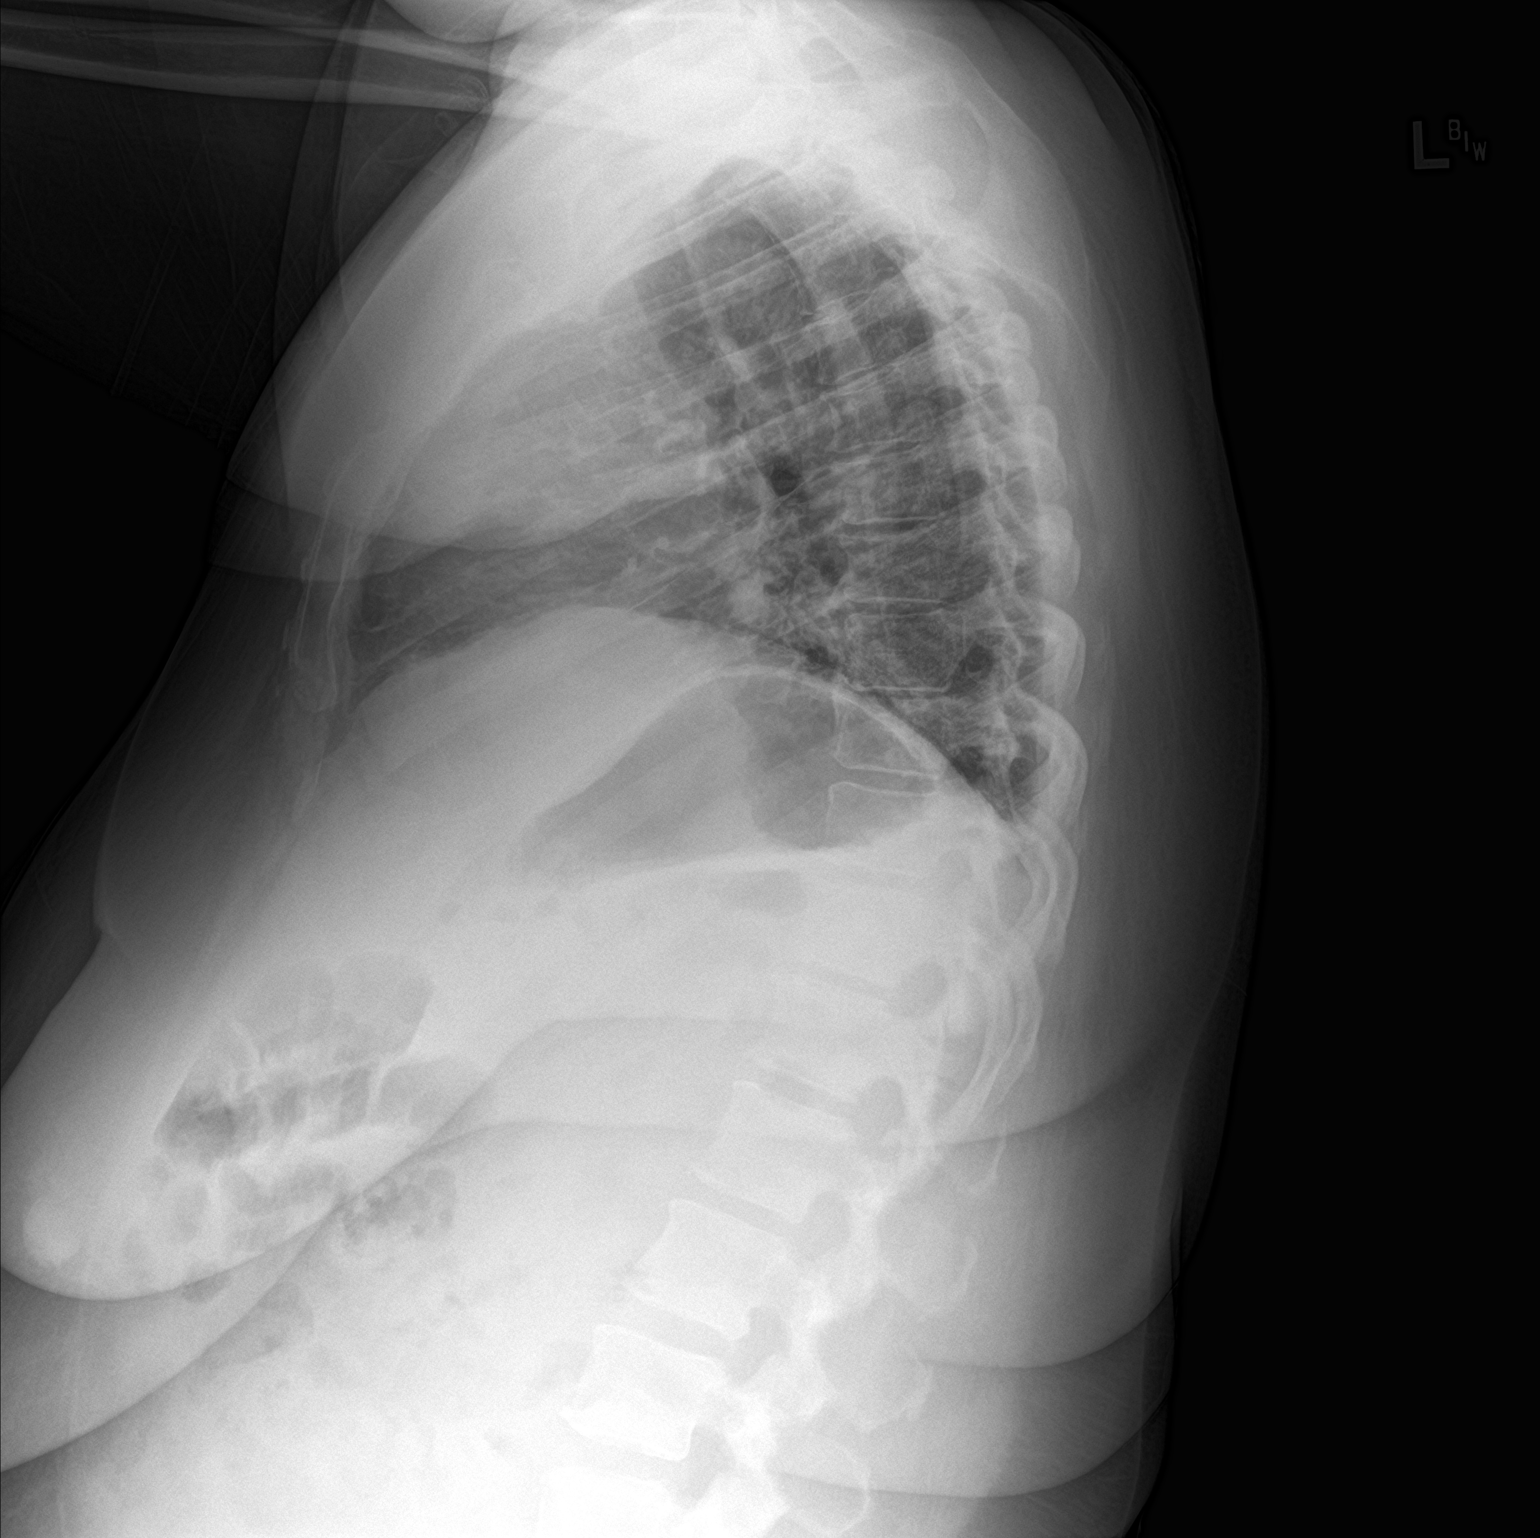

[2 of 2 positions shown; findings below may reference images not displayed]

FINDINGS: Low lung volumes. Heart is borderline in size. Lungs clear. No
effusions or edema. No acute bony abnormality.
IMPRESSION: Low lung volumes.  No active disease or change.

## 2021-03-20 MED ORDER — TECHNETIUM TO 99M ALBUMIN AGGREGATED
4.3000 | Freq: Once | INTRAVENOUS | Status: AC | PRN
  Administered 2021-03-20: 4.3 via INTRAVENOUS

## 2021-03-23 ENCOUNTER — Ambulatory Visit (HOSPITAL_COMMUNITY): Payer: 59

## 2021-04-01 ENCOUNTER — Telehealth: Payer: Self-pay | Admitting: Cardiology

## 2021-04-01 NOTE — Telephone Encounter (Signed)
Spoke with the patient in regards to her testing. She has already had her VQ scan done which was normal. She is scheduled for her PFTs and understands the reasoning. She is aware that her prior auth for her sleep study is pending and we will call to schedule her for it soon.

## 2021-04-01 NOTE — Telephone Encounter (Signed)
New message    Please call patient and tell her why she is having "all of these test".  She is having a PFT test.  She is also having a COVID test because she has not had her COVID vaccines.  Also, pt is having a VQ scan.  All test has been scheduled and pt is aware of test but do not know why she is having them and it is making her nervous.  Thanks.

## 2021-04-13 ENCOUNTER — Ambulatory Visit (INDEPENDENT_AMBULATORY_CARE_PROVIDER_SITE_OTHER): Payer: 59 | Admitting: Internal Medicine

## 2021-04-13 ENCOUNTER — Telehealth (HOSPITAL_COMMUNITY): Payer: Self-pay | Admitting: Vascular Surgery

## 2021-04-13 ENCOUNTER — Other Ambulatory Visit: Payer: Self-pay

## 2021-04-13 DIAGNOSIS — I2781 Cor pulmonale (chronic): Secondary | ICD-10-CM

## 2021-04-13 DIAGNOSIS — I10 Essential (primary) hypertension: Secondary | ICD-10-CM

## 2021-04-13 DIAGNOSIS — I50810 Right heart failure, unspecified: Secondary | ICD-10-CM | POA: Diagnosis not present

## 2021-04-13 DIAGNOSIS — I272 Pulmonary hypertension, unspecified: Secondary | ICD-10-CM

## 2021-04-13 DIAGNOSIS — R600 Localized edema: Secondary | ICD-10-CM

## 2021-04-13 LAB — PULMONARY FUNCTION TEST
DL/VA % pred: 92 %
DL/VA: 4.09 ml/min/mmHg/L
DLCO cor % pred: 45 %
DLCO cor: 8.19 ml/min/mmHg
DLCO unc % pred: 46 %
DLCO unc: 8.39 ml/min/mmHg
FEF 25-75 Post: 2.05 L/sec
FEF 25-75 Pre: 1.62 L/sec
FEF2575-%Change-Post: 26 %
FEF2575-%Pred-Post: 99 %
FEF2575-%Pred-Pre: 78 %
FEV1-%Change-Post: 10 %
FEV1-%Pred-Post: 44 %
FEV1-%Pred-Pre: 40 %
FEV1-Post: 0.84 L
FEV1-Pre: 0.77 L
FEV1FVC-%Change-Post: 0 %
FEV1FVC-%Pred-Pre: 117 %
FEV6-%Change-Post: 10 %
FEV6-%Pred-Post: 38 %
FEV6-%Pred-Pre: 34 %
FEV6-Post: 0.89 L
FEV6-Pre: 0.81 L
FEV6FVC-%Change-Post: 0 %
FEV6FVC-%Pred-Post: 103 %
FEV6FVC-%Pred-Pre: 103 %
FVC-%Change-Post: 10 %
FVC-%Pred-Post: 37 %
FVC-%Pred-Pre: 33 %
FVC-Post: 0.89 L
FVC-Pre: 0.81 L
Post FEV1/FVC ratio: 94 %
Post FEV6/FVC ratio: 100 %
Pre FEV1/FVC ratio: 95 %
Pre FEV6/FVC Ratio: 100 %
RV % pred: 64 %
RV: 1.06 L
TLC % pred: 45 %
TLC: 2.04 L

## 2021-04-13 NOTE — Progress Notes (Signed)
PFT done today.

## 2021-04-13 NOTE — Telephone Encounter (Signed)
Left pt message giving new pulm appt w/ [facility] , asked pt to call back to confirm

## 2021-04-16 ENCOUNTER — Telehealth: Payer: Self-pay

## 2021-04-16 DIAGNOSIS — I272 Pulmonary hypertension, unspecified: Secondary | ICD-10-CM

## 2021-04-16 NOTE — Telephone Encounter (Signed)
The patient has been notified of the result and verbalized understanding.  All questions (if any) were answered. Antonieta Iba, RN 04/16/2021 2:48 PM  Referral has been placed.

## 2021-04-16 NOTE — Telephone Encounter (Signed)
-----  Message from Antonieta Iba, RN sent at 04/15/2021  8:35 AM EST ----- Werner Lean, MD  Antonieta Iba, RN Has AHF f/u for Group III PH eval vs CTEPH.  Refer to Pulm ([facility] vs [facility] for query of ILD in the setting of PI and PH)  Thanks,  MAC

## 2021-04-23 LAB — HM MAMMOGRAPHY

## 2021-04-29 ENCOUNTER — Ambulatory Visit (HOSPITAL_COMMUNITY)
Admission: RE | Admit: 2021-04-29 | Discharge: 2021-04-29 | Disposition: A | Discharge status: Home / Self Care | Payer: 59 | Source: Ambulatory Visit | Attending: Cardiology | Admitting: Cardiology

## 2021-04-29 ENCOUNTER — Encounter (HOSPITAL_COMMUNITY): Payer: Self-pay | Admitting: Cardiology

## 2021-04-29 ENCOUNTER — Other Ambulatory Visit: Payer: Self-pay

## 2021-04-29 VITALS — BP 112/80 | HR 96 | Wt 197.4 lb

## 2021-04-29 DIAGNOSIS — I272 Pulmonary hypertension, unspecified: Secondary | ICD-10-CM | POA: Diagnosis not present

## 2021-04-29 DIAGNOSIS — G7249 Other inflammatory and immune myopathies, not elsewhere classified: Secondary | ICD-10-CM | POA: Insufficient documentation

## 2021-04-29 DIAGNOSIS — I2721 Secondary pulmonary arterial hypertension: Secondary | ICD-10-CM | POA: Diagnosis present

## 2021-04-29 DIAGNOSIS — R0902 Hypoxemia: Secondary | ICD-10-CM | POA: Insufficient documentation

## 2021-04-29 DIAGNOSIS — E119 Type 2 diabetes mellitus without complications: Secondary | ICD-10-CM | POA: Insufficient documentation

## 2021-04-29 DIAGNOSIS — I3139 Other pericardial effusion (noninflammatory): Secondary | ICD-10-CM | POA: Diagnosis not present

## 2021-04-29 DIAGNOSIS — R9431 Abnormal electrocardiogram [ECG] [EKG]: Secondary | ICD-10-CM | POA: Diagnosis not present

## 2021-04-29 DIAGNOSIS — Z79899 Other long term (current) drug therapy: Secondary | ICD-10-CM | POA: Diagnosis not present

## 2021-04-29 DIAGNOSIS — I50810 Right heart failure, unspecified: Secondary | ICD-10-CM | POA: Diagnosis not present

## 2021-04-29 DIAGNOSIS — I10 Essential (primary) hypertension: Secondary | ICD-10-CM | POA: Insufficient documentation

## 2021-04-29 LAB — CBC
HCT: 37.2 % (ref 36.0–46.0)
Hemoglobin: 13.4 g/dL (ref 12.0–15.0)
MCH: 30.4 pg (ref 26.0–34.0)
MCHC: 36 g/dL (ref 30.0–36.0)
MCV: 84.4 fL (ref 80.0–100.0)
Platelets: 260 10*3/uL (ref 150–400)
RBC: 4.41 MIL/uL (ref 3.87–5.11)
RDW: 15.9 % — ABNORMAL HIGH (ref 11.5–15.5)
WBC: 6.1 10*3/uL (ref 4.0–10.5)
nRBC: 0 % (ref 0.0–0.2)

## 2021-04-29 LAB — BASIC METABOLIC PANEL
Anion gap: 8 (ref 5–15)
BUN: 17 mg/dL (ref 6–20)
CO2: 29 mmol/L (ref 22–32)
Calcium: 9.2 mg/dL (ref 8.9–10.3)
Chloride: 102 mmol/L (ref 98–111)
Creatinine, Ser: 0.93 mg/dL (ref 0.44–1.00)
GFR, Estimated: >60 mL/min (ref >60)
Glucose, Bld: 107 mg/dL — ABNORMAL HIGH (ref 70–99)
Potassium: 4.1 mmol/L (ref 3.5–5.1)
Sodium: 139 mmol/L (ref 135–145)

## 2021-04-29 LAB — BRAIN NATRIURETIC PEPTIDE: B Natriuretic Peptide: 1288 pg/mL — ABNORMAL HIGH (ref 0.0–100.0)

## 2021-04-29 MED ORDER — FUROSEMIDE 40 MG PO TABS: 40.0000 mg | ORAL_TABLET | Freq: Every day | ORAL | 3 refills | Status: DC

## 2021-04-29 NOTE — H&P (View-Only) (Signed)
PCP: Minette Brine, FNP Cardiology: Dr. Radford Pax HF Cardiology: Dr. [name]  56 y.o. with history of HTN and diabetes as well as remote inflammatory myopathy was referred by Dr. Radford Pax for evaluation of pulmonary hypertension.  Patient was referred by PCP to Dr. Radford Pax in 8/22 for peripheral edema.  She has noted dyspnea if she walks fast for several months.  She has been short of breath walking up stairs and up inclines.  No chest pain.  She has chronic 2-3 pillow orthopnea.  No PND.  No lightheadedness/syncope.    She has a chronic mild elevation in CKD and was treated "years ago" with prednisone for an inflammatory myopathy.   Echo was done by Dr. Radford Pax, showing EF 60-65%, mild LVH, severe RV enlargement with severely decreased RV systolic function, PASP 66 mmHg, severe RAE, moderate TR, moderate PR, small pericardial effusion.  CTA chest showed no PE, no significant lung abnormalities.  V/Q scan showed no chronic PE.   Labs (11/22): CK 252, CRP 26, ESR 12, RF negative, ANA negative, K 4.3, creatinine 0.9  ECG (personally reviewed): NSR, RVH, inferior and anterolateral Qs  6 minute walk (1/23): 152 m with oxygen saturation < 88% with ambulation  PMH: 1. Type 2 diabetes 2. 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 Pulmonary hypertension: Echo (11/22) with EF 60-65%, mild LVH, severe RV enlargement with severely decreased RV systolic function, PASP 66 mmHg, severe RAE, moderate TR, moderate PR, small pericardial effusion.  - CTA chest: No PE, lungs clear.  - V/Q scan: No evidence for chronic PE.   Social History   Socioeconomic History   Marital status: Single    Spouse name: Not on file   Number of children: 1   Years of education: College   Highest education level: Not on file  Occupational History    Comment: [employer]  Tobacco Use   Smoking status: Never   Smokeless tobacco: Never  Substance and Sexual Activity   Alcohol use: No   Drug use: No    Sexual activity: Not on file  Other Topics Concern   Not on file  Social History Narrative   Patient lives at home with her daughter.   Caffeine Use: 1 cup daily   Social Determinants of Health   Financial Resource Strain: Not on file  Food Insecurity: Not on file  Transportation Needs: Not on file  Physical Activity: Not on file  Stress: Not on file  Social Connections: Not on file  Intimate Partner Violence: Not on file   Family History  Problem Relation Age of Onset   Hypertension Mother    Diabetes Mother    Hypertension Father    Heart disease Other    Hypertension Other    Diabetes Other    ROS: All systems reviewed and negative except as per HPI  Current Outpatient Medications  Medication Sig Dispense Refill   atorvastatin (LIPITOR) 20 MG tablet Take 1 tablet (20 mg total) by mouth daily. 90 tablet 1   cholecalciferol (VITAMIN D) 1000 UNITS tablet Take 1,000 Units by mouth daily.      metFORMIN (GLUCOPHAGE) 1000 MG tablet TAKE 1 TABLET BY MOUTH TWICE DAILY WITH  A  MEAL. 180 tablet 1   potassium chloride SA (KLOR-CON) 20 MEQ tablet Take 1 tablet (20 mEq total) by mouth daily. 90 tablet 3   valsartan (DIOVAN) 160 MG tablet Take 1 tablet (160 mg total) by mouth daily. 90 tablet 3  furosemide (LASIX) 40 MG tablet Take 1 tablet (40 mg total) by mouth daily. 90 tablet 3   No current facility-administered medications for this encounter.   BP 112/80    Pulse 96    Wt 89.5 kg (197 lb 6.4 oz)    SpO2 90%    BMI 38.55 kg/m  General: NAD Neck: JVP 10 cm, no thyromegaly or thyroid nodule.  Lungs: Mild crackles at bases.  CV: Nondisplaced PMI.  Heart regular S1/S2, no S3/S4, no murmur.  1+ edema to knees.  No carotid bruit.  Normal pedal pulses.  Abdomen: Soft, nontender, no hepatosplenomegaly, no distention.  Skin: Intact without lesions or rashes.  Neurologic: Alert and oriented x 3.  Psych: Normal affect. Extremities: No clubbing or cyanosis.  HEENT: Normal.    Assessment/Plan: 1. Pulmonary hypertension: Echo in 11/22 showed EF 60-65%, mild LVH, severe RV enlargement with severely decreased RV systolic function, PASP 66 mmHg, severe RAE, moderate TR, moderate PR, small pericardial effusion. CTA chest showed no PE, normal lung fields and V/Q scan showed no evidence for chronic PE.  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  No history of liver disease.  She does have history of "inflammatory myopathy" and has had chronic mild CK elevation.  ANA and RF negative with elevated CRP. 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  On exam, she is volume overloaded (RV failure).  Baseline 6 minute walk was done today with significant impairment as well as exertional hypoxemia.  - Increase Lasix to 40 mg daily. BMET/BNP today, BMET 10 days.  - I will arrange for right and left heart catheterization to assess filling pressures and for pulmonary arterial hypertension. I will add coronary angiography given significant exertional symptoms and inferior and anterior Qs on ECG. We discussed risks/benefits and she agrees to procedure.  - If she has significant pulmonary arterial hypertension with controlled PCWP, will start treatment for PAH with Opsumit/tadalafil combination.  - She will need a sleep study, has been ordered by Dr. Radford Pax.  - I will check anti-centromere Ab and anti-SCL-70 Ab to complete rheumatological workup.  She has an appointment with rheumatology.  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  She says that she was treated with prednisone in the past. She does not report myalgias or muscle pain.   - She has rheumatology appointment scheduled.   Followup in 1 month after cath.   Loralie Champagne 04/29/2021

## 2021-04-29 NOTE — Progress Notes (Signed)
PCP: Minette Brine, FNP Cardiology: Dr. Radford Pax HF Cardiology: Dr. [name]  56 y.o. with history of HTN and diabetes as well as remote inflammatory myopathy was referred by Dr. Radford Pax for evaluation of pulmonary hypertension.  Patient was referred by PCP to Dr. Radford Pax in 8/22 for peripheral edema.  She has noted dyspnea if she walks fast for several months.  She has been short of breath walking up stairs and up inclines.  No chest pain.  She has chronic 2-3 pillow orthopnea.  No PND.  No lightheadedness/syncope.    She has a chronic mild elevation in CKD and was treated "years ago" with prednisone for an inflammatory myopathy.   Echo was done by Dr. Radford Pax, showing EF 60-65%, mild LVH, severe RV enlargement with severely decreased RV systolic function, PASP 66 mmHg, severe RAE, moderate TR, moderate PR, small pericardial effusion.  CTA chest showed no PE, no significant lung abnormalities.  V/Q scan showed no chronic PE.   Labs (11/22): CK 252, CRP 26, ESR 12, RF negative, ANA negative, K 4.3, creatinine 0.9  ECG (personally reviewed): NSR, RVH, inferior and anterolateral Qs  6 minute walk (1/23): 152 m with oxygen saturation < 88% with ambulation  PMH: 1. Type 2 diabetes 2. 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 Pulmonary hypertension: Echo (11/22) with EF 60-65%, mild LVH, severe RV enlargement with severely decreased RV systolic function, PASP 66 mmHg, severe RAE, moderate TR, moderate PR, small pericardial effusion.  - CTA chest: No PE, lungs clear.  - V/Q scan: No evidence for chronic PE.   Social History   Socioeconomic History   Marital status: Single    Spouse name: Not on file   Number of children: 1   Years of education: College   Highest education level: Not on file  Occupational History    Comment: [employer]  Tobacco Use   Smoking status: Never   Smokeless tobacco: Never  Substance and Sexual Activity   Alcohol use: No   Drug use: No    Sexual activity: Not on file  Other Topics Concern   Not on file  Social History Narrative   Patient lives at home with her daughter.   Caffeine Use: 1 cup daily   Social Determinants of Health   Financial Resource Strain: Not on file  Food Insecurity: Not on file  Transportation Needs: Not on file  Physical Activity: Not on file  Stress: Not on file  Social Connections: Not on file  Intimate Partner Violence: Not on file   Family History  Problem Relation Age of Onset   Hypertension Mother    Diabetes Mother    Hypertension Father    Heart disease Other    Hypertension Other    Diabetes Other    ROS: All systems reviewed and negative except as per HPI  Current Outpatient Medications  Medication Sig Dispense Refill   atorvastatin (LIPITOR) 20 MG tablet Take 1 tablet (20 mg total) by mouth daily. 90 tablet 1   cholecalciferol (VITAMIN D) 1000 UNITS tablet Take 1,000 Units by mouth daily.      metFORMIN (GLUCOPHAGE) 1000 MG tablet TAKE 1 TABLET BY MOUTH TWICE DAILY WITH  A  MEAL. 180 tablet 1   potassium chloride SA (KLOR-CON) 20 MEQ tablet Take 1 tablet (20 mEq total) by mouth daily. 90 tablet 3   valsartan (DIOVAN) 160 MG tablet Take 1 tablet (160 mg total) by mouth daily. 90 tablet 3  furosemide (LASIX) 40 MG tablet Take 1 tablet (40 mg total) by mouth daily. 90 tablet 3   No current facility-administered medications for this encounter.   BP 112/80    Pulse 96    Wt 89.5 kg (197 lb 6.4 oz)    SpO2 90%    BMI 38.55 kg/m  General: NAD Neck: JVP 10 cm, no thyromegaly or thyroid nodule.  Lungs: Mild crackles at bases.  CV: Nondisplaced PMI.  Heart regular S1/S2, no S3/S4, no murmur.  1+ edema to knees.  No carotid bruit.  Normal pedal pulses.  Abdomen: Soft, nontender, no hepatosplenomegaly, no distention.  Skin: Intact without lesions or rashes.  Neurologic: Alert and oriented x 3.  Psych: Normal affect. Extremities: No clubbing or cyanosis.  HEENT: Normal.    Assessment/Plan: 1. Pulmonary hypertension: Echo in 11/22 showed EF 60-65%, mild LVH, severe RV enlargement with severely decreased RV systolic function, PASP 66 mmHg, severe RAE, moderate TR, moderate PR, small pericardial effusion. CTA chest showed no PE, normal lung fields and V/Q scan showed no evidence for chronic PE.  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  No history of liver disease.  She does have history of "inflammatory myopathy" and has had chronic mild CK elevation.  ANA and RF negative with elevated CRP. 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  On exam, she is volume overloaded (RV failure).  Baseline 6 minute walk was done today with significant impairment as well as exertional hypoxemia.  - Increase Lasix to 40 mg daily. BMET/BNP today, BMET 10 days.  - I will arrange for right and left heart catheterization to assess filling pressures and for pulmonary arterial hypertension. I will add coronary angiography given significant exertional symptoms and inferior and anterior Qs on ECG. We discussed risks/benefits and she agrees to procedure.  - If she has significant pulmonary arterial hypertension with controlled PCWP, will start treatment for PAH with Opsumit/tadalafil combination.  - She will need a sleep study, has been ordered by Dr. Radford Pax.  - I will check anti-centromere Ab and anti-SCL-70 Ab to complete rheumatological workup.  She has an appointment with rheumatology.  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  She says that she was treated with prednisone in the past. She does not report myalgias or muscle pain.   - She has rheumatology appointment scheduled.   Followup in 1 month after cath.   Loralie Champagne 04/29/2021

## 2021-04-29 NOTE — Patient Instructions (Addendum)
EKG done today.  6 minute walk test done today.   Labs done today. We will contact you only if your labs are abnormal.  INCREASE Lasix to 37m (1 tablet) by mouth daily.   No other medication changes were made. Please continue all current medications as prescribed.  Your physician recommends that you schedule a follow-up appointment in: 1 month for a lab only appointment.   If you have any questions or concerns before your next appointment please send [facility] message through [facility] call our office at [phone]    TO LEAVE A MESSAGE FOR THE NURSE SELECT OPTION 2, PLEASE LEAVE A MESSAGE INCLUDING: YOUR NAME DATE OF BIRTH CALL BACK NUMBER REASON FOR CALL**this is important as we prioritize the call backs  YOU WILL RECEIVE A CALL BACK THE SAME DAY AS LONG AS YOU CALL BEFORE 4:00 PM   Do the following things EVERYDAY: Weigh yourself in the morning before breakfast. Write it down and keep it in a log. Take your medicines as prescribed Eat low salt foods--Limit salt (sodium) to 2000 mg per day.  Stay as active as you can everyday Limit all fluids for the day to less than 2 liters   At the [facility] you and your health needs are our priority. As part of our continuing mission to provide you with exceptional heart care, we have created designated Provider Care Teams. These Care Teams include your primary Cardiologist (physician) and Advanced Practice Providers (APPs- Physician Assistants and Nurse Practitioners) who all work together to provide you with the care you need, when you need it.   You may see any of the following providers on your designated Care Team at your next follow up: Dr DGlori BickersDr DHaynes Kerns NP BLyda Jester PUtahLAudry Riles PharmD   Please be sure to bring in all your medications bottles to every appointment.   You are scheduled for a Cardiac Catheterization on Monday, January 16 with Dr. DLoralie Champagne  1.  [facility] [address] [id number] 5:30 AM (This time is two hours before your procedure to ensure your preparation). Free valet parking service is available.   Special note: Every effort is made to have your procedure done on time. Please understand that emergencies sometimes delay scheduled procedures.  2. Diet: Do not eat solid foods after midnight.  The patient may have clear liquids until 5am upon the day of the procedure.  4. Medication instructions in preparation for your procedure:  DO NOT take Lasix the morning of your procedure.  Do not take Diabetes Med Glucophage (Metformin) on the day of the procedure and HOLD 48 HOURS AFTER THE PROCEDURE.  On the morning of your procedure, take your any morning medicines NOT listed above.  You may use sips of water.  5. Plan for one night stay--bring personal belongings. 6. Bring a current list of your medications and current insurance cards. 7. You MUST have a responsible person to drive you home. 8. Someone MUST be with you the first 24 hours after you arrive home or your discharge will be delayed. 9. Please wear clothes that are easy to get on and off and wear slip-on shoes.  Thank you for allowing [facility] care for you!   -- [facility]

## 2021-04-29 NOTE — Progress Notes (Signed)
6 Min Walk Test Completed  Pt ambulated 152.4 meters O2 Sat ranged 91-93 on 4L oxygen HR ranged 100-110

## 2021-05-01 ENCOUNTER — Encounter: Payer: Self-pay | Admitting: Nurse Practitioner

## 2021-05-08 ENCOUNTER — Telehealth (HOSPITAL_COMMUNITY): Payer: Self-pay

## 2021-05-08 NOTE — Telephone Encounter (Signed)
error

## 2021-05-11 ENCOUNTER — Encounter (HOSPITAL_COMMUNITY)
Admission: RE | Disposition: A | Discharge status: Home / Self Care | Payer: Self-pay | Source: Home / Self Care | Attending: Cardiology

## 2021-05-11 ENCOUNTER — Other Ambulatory Visit: Payer: Self-pay | Admitting: Nurse Practitioner

## 2021-05-11 ENCOUNTER — Ambulatory Visit (HOSPITAL_COMMUNITY)
Admission: RE | Admit: 2021-05-11 | Discharge: 2021-05-11 | Disposition: A | Discharge status: Home / Self Care | Payer: 59 | Attending: Cardiology | Admitting: Cardiology

## 2021-05-11 ENCOUNTER — Other Ambulatory Visit: Payer: Self-pay

## 2021-05-11 ENCOUNTER — Other Ambulatory Visit (HOSPITAL_COMMUNITY): Payer: Self-pay

## 2021-05-11 DIAGNOSIS — R0609 Other forms of dyspnea: Secondary | ICD-10-CM | POA: Diagnosis not present

## 2021-05-11 DIAGNOSIS — I272 Pulmonary hypertension, unspecified: Secondary | ICD-10-CM

## 2021-05-11 DIAGNOSIS — I10 Essential (primary) hypertension: Secondary | ICD-10-CM | POA: Insufficient documentation

## 2021-05-11 DIAGNOSIS — G7249 Other inflammatory and immune myopathies, not elsewhere classified: Secondary | ICD-10-CM | POA: Insufficient documentation

## 2021-05-11 DIAGNOSIS — E119 Type 2 diabetes mellitus without complications: Secondary | ICD-10-CM | POA: Insufficient documentation

## 2021-05-11 DIAGNOSIS — E118 Type 2 diabetes mellitus with unspecified complications: Secondary | ICD-10-CM

## 2021-05-11 DIAGNOSIS — I2721 Secondary pulmonary arterial hypertension: Secondary | ICD-10-CM | POA: Diagnosis present

## 2021-05-11 HISTORY — PX: RIGHT/LEFT HEART CATH AND CORONARY ANGIOGRAPHY: CATH118266

## 2021-05-11 LAB — POCT I-STAT EG7
Acid-Base Excess: 4 mmol/L — ABNORMAL HIGH (ref 0.0–2.0)
Acid-Base Excess: 5 mmol/L — ABNORMAL HIGH (ref 0.0–2.0)
Bicarbonate: 31.8 mmol/L — ABNORMAL HIGH (ref 20.0–28.0)
Bicarbonate: 32.2 mmol/L — ABNORMAL HIGH (ref 20.0–28.0)
Calcium, Ion: 1.18 mmol/L (ref 1.15–1.40)
Calcium, Ion: 1.16 mmol/L (ref 1.15–1.40)
HCT: 43 % (ref 36.0–46.0)
HCT: 43 % (ref 36.0–46.0)
Hemoglobin: 14.6 g/dL (ref 12.0–15.0)
Hemoglobin: 14.6 g/dL (ref 12.0–15.0)
O2 Saturation: 60 %
O2 Saturation: 69 %
Potassium: 3.8 mmol/L (ref 3.5–5.1)
Potassium: 3.7 mmol/L (ref 3.5–5.1)
Sodium: 145 mmol/L (ref 135–145)
Sodium: 144 mmol/L (ref 135–145)
TCO2: 34 mmol/L — ABNORMAL HIGH (ref 22–32)
TCO2: 34 mmol/L — ABNORMAL HIGH (ref 22–32)
pCO2, Ven: 59.1 mmHg (ref 44.0–60.0)
pCO2, Ven: 59.7 mmHg (ref 44.0–60.0)
pH, Ven: 7.34 (ref 7.250–7.430)
pH, Ven: 7.34 (ref 7.250–7.430)
pO2, Ven: 34 mmHg (ref 32.0–45.0)
pO2, Ven: 39 mmHg (ref 32.0–45.0)

## 2021-05-11 LAB — PREGNANCY, URINE: Preg Test, Ur: NEGATIVE

## 2021-05-11 LAB — GLUCOSE, CAPILLARY: Glucose-Capillary: 92 mg/dL (ref 70–99)

## 2021-05-11 SURGERY — RIGHT/LEFT HEART CATH AND CORONARY ANGIOGRAPHY
Anesthesia: LOCAL

## 2021-05-11 MED ORDER — IOHEXOL 350 MG/ML SOLN
INTRAVENOUS | Status: DC | PRN
  Administered 2021-05-11: 55 mL via INTRA_ARTERIAL

## 2021-05-11 MED ORDER — MIDAZOLAM HCL 2 MG/2ML IJ SOLN
INTRAMUSCULAR | Status: AC
  Filled 2021-05-11: qty 2

## 2021-05-11 MED ORDER — FENTANYL CITRATE (PF) 100 MCG/2ML IJ SOLN
INTRAMUSCULAR | Status: AC
  Filled 2021-05-11: qty 2

## 2021-05-11 MED ORDER — LIDOCAINE HCL (PF) 1 % IJ SOLN
INTRAMUSCULAR | Status: AC
  Filled 2021-05-11: qty 30

## 2021-05-11 MED ORDER — HYDRALAZINE HCL 20 MG/ML IJ SOLN: 10.0000 mg | INTRAMUSCULAR | Status: DC | PRN

## 2021-05-11 MED ORDER — SODIUM CHLORIDE 0.9% FLUSH: 3.0000 mL | INTRAVENOUS | Status: DC | PRN

## 2021-05-11 MED ORDER — HEPARIN (PORCINE) IN NACL 1000-0.9 UT/500ML-% IV SOLN
INTRAVENOUS | Status: AC
  Filled 2021-05-11: qty 500

## 2021-05-11 MED ORDER — FUROSEMIDE 40 MG PO TABS: 60.0000 mg | ORAL_TABLET | Freq: Every day | ORAL | 3 refills | Status: DC

## 2021-05-11 MED ORDER — HEPARIN SODIUM (PORCINE) 1000 UNIT/ML IJ SOLN
INTRAMUSCULAR | Status: DC | PRN
  Administered 2021-05-11: 4000 [IU] via INTRAVENOUS

## 2021-05-11 MED ORDER — ASPIRIN 81 MG PO CHEW
81.0000 mg | CHEWABLE_TABLET | ORAL | Status: AC
  Administered 2021-05-11: 81 mg via ORAL
  Filled 2021-05-11: qty 1

## 2021-05-11 MED ORDER — VERAPAMIL HCL 2.5 MG/ML IV SOLN
INTRAVENOUS | Status: AC
  Filled 2021-05-11: qty 2

## 2021-05-11 MED ORDER — MIDAZOLAM HCL 2 MG/2ML IJ SOLN
INTRAMUSCULAR | Status: DC | PRN
  Administered 2021-05-11: 1 mg via INTRAVENOUS

## 2021-05-11 MED ORDER — SODIUM CHLORIDE 0.9 % IV SOLN: 250.0000 mL | INTRAVENOUS | Status: DC | PRN

## 2021-05-11 MED ORDER — LABETALOL HCL 5 MG/ML IV SOLN
10.0000 mg | INTRAVENOUS | Status: DC | PRN
  Administered 2021-05-11: 5 mg via INTRAVENOUS
  Filled 2021-05-11: qty 4

## 2021-05-11 MED ORDER — SODIUM CHLORIDE 0.9% FLUSH: 3.0000 mL | Freq: Two times a day (BID) | INTRAVENOUS | Status: DC

## 2021-05-11 MED ORDER — SODIUM CHLORIDE 0.9 % IV SOLN: INTRAVENOUS | Status: DC

## 2021-05-11 MED ORDER — LIDOCAINE HCL (PF) 1 % IJ SOLN
INTRAMUSCULAR | Status: DC | PRN
  Administered 2021-05-11 (×2): 2 mL via INTRADERMAL

## 2021-05-11 MED ORDER — ONDANSETRON HCL 4 MG/2ML IJ SOLN: 4.0000 mg | Freq: Four times a day (QID) | INTRAMUSCULAR | Status: DC | PRN

## 2021-05-11 MED ORDER — VERAPAMIL HCL 2.5 MG/ML IV SOLN
INTRAVENOUS | Status: DC | PRN
  Administered 2021-05-11: 10 mL via INTRA_ARTERIAL

## 2021-05-11 MED ORDER — HEPARIN (PORCINE) IN NACL 1000-0.9 UT/500ML-% IV SOLN
INTRAVENOUS | Status: DC | PRN
  Administered 2021-05-11 (×2): 500 mL

## 2021-05-11 MED ORDER — FENTANYL CITRATE (PF) 100 MCG/2ML IJ SOLN
INTRAMUSCULAR | Status: DC | PRN
  Administered 2021-05-11 (×2): 25 ug via INTRAVENOUS

## 2021-05-11 MED ORDER — ACETAMINOPHEN 325 MG PO TABS: 650.0000 mg | ORAL_TABLET | ORAL | Status: DC | PRN

## 2021-05-11 MED ORDER — HEPARIN SODIUM (PORCINE) 1000 UNIT/ML IJ SOLN
INTRAMUSCULAR | Status: AC
  Filled 2021-05-11: qty 10

## 2021-05-11 SURGICAL SUPPLY — 12 items
CATH BALLN WEDGE 5F 110CM (CATHETERS) ×1 IMPLANT
CATH INFINITI 5 FR 3DRC (CATHETERS) ×1 IMPLANT
CATH INFINITI 5 FR JL3.5 (CATHETERS) ×1 IMPLANT
CATH INFINITI JR4 5F (CATHETERS) ×1 IMPLANT
GLIDESHEATH SLEND SS 6F .021 (SHEATH) ×1 IMPLANT
GUIDEWIRE INQWIRE 1.5J.035X260 (WIRE) IMPLANT
INQWIRE 1.5J .035X260CM (WIRE) ×2
KIT HEART LEFT (KITS) ×2 IMPLANT
PACK CARDIAC CATHETERIZATION (CUSTOM PROCEDURE TRAY) ×2 IMPLANT
SHEATH GLIDE SLENDER 4/5FR (SHEATH) ×1 IMPLANT
TRANSDUCER W/STOPCOCK (MISCELLANEOUS) ×2 IMPLANT
WIRE HI TORQ VERSACORE-J 145CM (WIRE) ×1 IMPLANT

## 2021-05-11 NOTE — Interval H&P Note (Signed)
History and Physical Interval Note:  05/11/2021 7:51 AM  Shannon [name]  has presented today for surgery, with the diagnosis of pulmonary HTN.  The various methods of treatment have been discussed with the patient and family. After consideration of risks, benefits and other options for treatment, the patient has consented to  Procedure(s): RIGHT/LEFT HEART CATH AND CORONARY ANGIOGRAPHY (N/A) as a surgical intervention.  The patient's history has been reviewed, patient examined, no change in status, stable for surgery.  I have reviewed the patient's chart and labs.  Questions were answered to the patient's satisfaction.     Kimberley Speece [employer]

## 2021-05-11 NOTE — Discharge Instructions (Addendum)
Increase Lasix to 60 mg daily.

## 2021-05-12 ENCOUNTER — Other Ambulatory Visit (HOSPITAL_COMMUNITY): Payer: Self-pay

## 2021-05-12 ENCOUNTER — Encounter (HOSPITAL_COMMUNITY): Payer: Self-pay | Admitting: Cardiology

## 2021-05-12 ENCOUNTER — Telehealth (HOSPITAL_COMMUNITY): Payer: Self-pay | Admitting: Pharmacy Technician

## 2021-05-12 NOTE — Telephone Encounter (Addendum)
Advanced Heart Failure Patient Advocate Encounter  Prior Authorization for Opsumit has been submitted over the phone at [phone] and approved.    PA# [id number]  Effective dates: 05/12/21 through 05/12/22  Called the patient to start Opsumit referral paperwork, had to leave a message.

## 2021-05-13 LAB — ANTI-SCLERODERMA ANTIBODY: Scleroderma (Scl-70) (ENA) Antibody, IgG: <0.2 AI (ref 0.0–0.9)

## 2021-05-18 NOTE — Telephone Encounter (Signed)
Spoke with patient, sent docusign link for Opsumit referral and [facility].

## 2021-05-26 ENCOUNTER — Institutional Professional Consult (permissible substitution): Payer: 59 | Admitting: Internal Medicine

## 2021-05-26 NOTE — Telephone Encounter (Signed)
Sent in referral, [facility] and PA approval to [facility] via fax.  Will follow up.

## 2021-05-28 ENCOUNTER — Ambulatory Visit: Payer: 59 | Admitting: Internal Medicine

## 2021-05-28 ENCOUNTER — Encounter: Payer: Self-pay | Admitting: Internal Medicine

## 2021-05-28 ENCOUNTER — Other Ambulatory Visit: Payer: Self-pay

## 2021-05-28 VITALS — BP 126/74 | HR 97 | Temp 97.6°F | Ht 60.0 in | Wt 190.0 lb

## 2021-05-28 DIAGNOSIS — I272 Pulmonary hypertension, unspecified: Secondary | ICD-10-CM | POA: Diagnosis not present

## 2021-05-28 DIAGNOSIS — Z8739 Personal history of other diseases of the musculoskeletal system and connective tissue: Secondary | ICD-10-CM | POA: Diagnosis not present

## 2021-05-28 DIAGNOSIS — R0609 Other forms of dyspnea: Secondary | ICD-10-CM

## 2021-05-28 DIAGNOSIS — R942 Abnormal results of pulmonary function studies: Secondary | ICD-10-CM | POA: Diagnosis not present
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
## 2021-05-28 NOTE — Patient Instructions (Addendum)
ICD-10-CM   1. DOE (dyspnea on exertion)  R06.09     2. History of myopathy  Z87.39     3. Restrictive pattern present on pulmonary function testing  R94.2     4. Pulmonary hypertension, unspecified (HCC)  I27.20        Shortness of breath is because of severe pulmonary hypertension and also myopathy causing restriction with your breathing We need to rule out there is no interstitial lung disease contributing to further shortness of breath Support getting sleep study and seeing rheumatology -for pulmonary hypertension and myopathy Recommend reestablishing with neurology -especially specialist with neuromuscular disease  Plan [shared decision making and also discussed with Dr. Kirk Ruths McLean] - Continue initiating work-up with rheumatology and sleep study - Continue treatment plan laid out by Dr. Loralie Champagne with medications against pulmonary hypertension -Refer Dr. Narda Amber at [facility] neurology -Get high-resolution CT chest supine and prone, inspiratory and expiratory phase  Follow-up - Video visit or face-to-face visit with nurse practitioner in few to several weeks to discuss CT scan results  -If no interstitial lung disease we will see him back in 6 months for routine follow-up

## 2021-05-29 ENCOUNTER — Ambulatory Visit (HOSPITAL_COMMUNITY)
Admission: RE | Admit: 2021-05-29 | Discharge: 2021-05-29 | Disposition: A | Discharge status: Home / Self Care | Payer: 59 | Source: Ambulatory Visit | Attending: Cardiology | Admitting: Cardiology

## 2021-05-29 ENCOUNTER — Other Ambulatory Visit (HOSPITAL_COMMUNITY): Payer: Self-pay

## 2021-05-29 VITALS — BP 108/80 | HR 75 | Wt 193.2 lb

## 2021-05-29 DIAGNOSIS — E119 Type 2 diabetes mellitus without complications: Secondary | ICD-10-CM | POA: Insufficient documentation

## 2021-05-29 DIAGNOSIS — N189 Chronic kidney disease, unspecified: Secondary | ICD-10-CM | POA: Diagnosis not present

## 2021-05-29 DIAGNOSIS — I3139 Other pericardial effusion (noninflammatory): Secondary | ICD-10-CM | POA: Diagnosis not present

## 2021-05-29 DIAGNOSIS — I081 Rheumatic disorders of both mitral and tricuspid valves: Secondary | ICD-10-CM | POA: Insufficient documentation

## 2021-05-29 DIAGNOSIS — G7249 Other inflammatory and immune myopathies, not elsewhere classified: Secondary | ICD-10-CM | POA: Diagnosis not present

## 2021-05-29 DIAGNOSIS — I2721 Secondary pulmonary arterial hypertension: Secondary | ICD-10-CM | POA: Insufficient documentation

## 2021-05-29 DIAGNOSIS — Z79899 Other long term (current) drug therapy: Secondary | ICD-10-CM | POA: Diagnosis not present

## 2021-05-29 DIAGNOSIS — R609 Edema, unspecified: Secondary | ICD-10-CM | POA: Diagnosis not present

## 2021-05-29 DIAGNOSIS — I272 Pulmonary hypertension, unspecified: Secondary | ICD-10-CM

## 2021-05-29 DIAGNOSIS — I50810 Right heart failure, unspecified: Secondary | ICD-10-CM | POA: Diagnosis not present

## 2021-05-29 DIAGNOSIS — J984 Other disorders of lung: Secondary | ICD-10-CM | POA: Insufficient documentation

## 2021-05-29 DIAGNOSIS — R0902 Hypoxemia: Secondary | ICD-10-CM | POA: Insufficient documentation

## 2021-05-29 DIAGNOSIS — I10 Essential (primary) hypertension: Secondary | ICD-10-CM | POA: Diagnosis not present

## 2021-05-29 LAB — BASIC METABOLIC PANEL
Anion gap: 12 (ref 5–15)
BUN: 24 mg/dL — ABNORMAL HIGH (ref 6–20)
CO2: 27 mmol/L (ref 22–32)
Calcium: 9.4 mg/dL (ref 8.9–10.3)
Chloride: 99 mmol/L (ref 98–111)
Creatinine, Ser: 1.15 mg/dL — ABNORMAL HIGH (ref 0.44–1.00)
GFR, Estimated: 57 mL/min — ABNORMAL LOW (ref >60)
Glucose, Bld: 153 mg/dL — ABNORMAL HIGH (ref 70–99)
Potassium: 4.2 mmol/L (ref 3.5–5.1)
Sodium: 138 mmol/L (ref 135–145)

## 2021-05-29 LAB — BRAIN NATRIURETIC PEPTIDE: B Natriuretic Peptide: 1193.9 pg/mL — ABNORMAL HIGH (ref 0.0–100.0)

## 2021-05-29 MED ORDER — FUROSEMIDE 80 MG PO TABS: 80.0000 mg | ORAL_TABLET | Freq: Every day | ORAL | 3 refills | Status: DC

## 2021-05-29 MED ORDER — TADALAFIL (PAH) 20 MG PO TABS: 20.0000 mg | ORAL_TABLET | Freq: Every day | ORAL | 3 refills | Status: DC

## 2021-05-29 NOTE — Progress Notes (Signed)
6 minute walk test performed.  Patient ambulated approximately 425 feet.  She stopped 3 times to rest. Heart rate ranged from 112-132 bpm and oxygen saturations from 89 down to 68.  Upon returning to the room after several minutes her oxygen saturations recovered to 88% and HR 120.

## 2021-05-29 NOTE — Patient Instructions (Addendum)
Increase Furosemide to 80 mg Daily  1 Week after starting Opsumit START Tadalafil 20 mg Daily  Labs done today, your results will be available in MyChart, we will contact you for abnormal readings.  Your physician recommends that you return for lab work in: 1-2 weeks  Your physician recommends that you schedule a follow-up appointment in: 2 months  If you have any questions or concerns before your next appointment please send [place] a message through [place] or call our office at [phone].    TO LEAVE A MESSAGE FOR THE NURSE SELECT OPTION 2, PLEASE LEAVE A MESSAGE INCLUDING: YOUR NAME DATE OF BIRTH CALL BACK NUMBER REASON FOR CALL**this is important as we prioritize the call backs  YOU WILL RECEIVE A CALL BACK THE SAME DAY AS LONG AS YOU CALL BEFORE 4:00 PM  At the [facility], you and your health needs are our priority. As part of our continuing mission to provide you with exceptional heart care, we have created designated Provider Care Teams. These Care Teams include your primary Cardiologist (physician) and Advanced Practice Providers (APPs- Physician Assistants and Nurse Practitioners) who all work together to provide you with the care you need, when you need it.   You may see any of the following providers on your designated Care Team at your next follow up: Dr Glori Bickers Dr Haynes Kerns, NP Lyda Jester, [place] [facility] [place], [place] Audry Riles, PharmD   Please be sure to bring in all your medications bottles to every appointment.

## 2021-05-29 NOTE — Progress Notes (Signed)
SATURATION QUALIFICATIONS: (This note is used to comply with regulatory documentation for home oxygen)  Patient Saturations on Room Air at Rest = 87%  Patient Saturations on Room Air while Ambulating = 84%  Patient Saturations on 2 Liters of oxygen while Ambulating = 94%  Please briefly explain why patient needs home oxygen:

## 2021-05-31 NOTE — Progress Notes (Signed)
PCP: Minette Brine, FNP Cardiology: Dr. Radford Pax HF Cardiology: Dr. [name]  56 y.o. with history of HTN and diabetes as well as remote inflammatory myopathy was referred by Dr. Radford Pax for evaluation of pulmonary hypertension.  Patient was referred by PCP to Dr. Radford Pax in 8/22 for peripheral edema.  She has noted dyspnea if she walks fast for several months.      She has a chronic mild elevation in CKD and was treated "years ago" with prednisone for an inflammatory myopathy.   Echo was done by Dr. Radford Pax, showing EF 60-65%, mild LVH, severe RV enlargement with severely decreased RV systolic function, PASP 66 mmHg, severe RAE, moderate TR, moderate PR, small pericardial effusion.  CTA chest showed no PE, no significant lung abnormalities.  V/Q scan showed no chronic PE. I did LHC/RHC in 1/23 that showed normal coronaries, elevated right heart filling pressures, severe pulmonary arterial hypertension, and normal PCWP.   She returns today for followup of pulmonary hypertension and RV failure.  She gets short of breath with fasting walking or walking up stairs. No chest pain.  No orthopnea/PND.  No lightheadedness/syncope. Opsumit just arrived to her house but she has not started it.   Labs (11/22): CK 252, CRP 26, ESR 12, RF negative, ANA negative, K 4.3, creatinine 0.9 Labs (1/23): SCL-70 negative, hgb 13.4, BNP 1288, K 41, creatinine 0.93  6 minute walk (1/23): 152 m with oxygen saturation < 88% with ambulation 6 minute walk (2/23): 129 m, oxygen saturation 84% with ambulation  PMH: 1. Type 2 diabetes 2. 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 Pulmonary hypertension: Echo (11/22) with EF 60-65%, mild LVH, severe RV enlargement with severely decreased RV systolic function, PASP 66 mmHg, severe RAE, moderate TR, moderate PR, small pericardial effusion.  - CTA chest: No PE, lungs clear.  - V/Q scan: No evidence for chronic PE.  - PFTs (12/22): Severe restriction,  decreased DLCO. - RHC/LHC (1/23): No coronary disease; mean RA 21, PA 70/35 mean 46, mean PCWP 4, CI 2.52, PVR 8.6 WU, PAPI 1.7  Social History   Socioeconomic History   Marital status: Single    Spouse name: Not on file   Number of children: 1   Years of education: College   Highest education level: Not on file  Occupational History    Comment: [employer]  Tobacco Use   Smoking status: Never   Smokeless tobacco: Never  Substance and Sexual Activity   Alcohol use: No   Drug use: No   Sexual activity: Not on file  Other Topics Concern   Not on file  Social History Narrative   Patient lives at home with her daughter.   Caffeine Use: 1 cup daily   Social Determinants of Health   Financial Resource Strain: Not on file  Food Insecurity: Not on file  Transportation Needs: Not on file  Physical Activity: Not on file  Stress: Not on file  Social Connections: Not on file  Intimate Partner Violence: Not on file   Family History  Problem Relation Age of Onset   Hypertension Mother    Diabetes Mother    Hypertension Father    Heart disease Other    Hypertension Other    Diabetes Other    ROS: All systems reviewed and negative except as per HPI  Current Outpatient Medications  Medication Sig Dispense Refill   Cholecalciferol (VITAMIN D) 125 MCG (5000 UT) CAPS Take 5,000 Units by mouth daily.  macitentan (OPSUMIT) 10 MG tablet Take 10 mg by mouth daily.     metFORMIN (GLUCOPHAGE) 1000 MG tablet TAKE 1 TABLET BY MOUTH TWICE DAILY WITH A MEAL 180 tablet 0   potassium chloride SA (KLOR-CON) 20 MEQ tablet Take 1 tablet (20 mEq total) by mouth daily. 90 tablet 3   tadalafil, PAH, (ADCIRCA) 20 MG tablet Take 1 tablet (20 mg total) by mouth daily. 30 tablet 3   valsartan (DIOVAN) 160 MG tablet Take 1 tablet (160 mg total) by mouth daily. 90 tablet 3   atorvastatin (LIPITOR) 20 MG tablet Take 1 tablet (20 mg total) by mouth daily. (Patient not taking: Reported on 05/29/2021) 90 tablet 1    furosemide (LASIX) 80 MG tablet Take 1 tablet (80 mg total) by mouth daily. 30 tablet 3   No current facility-administered medications for this encounter.   BP 108/80    Pulse 75    Wt 87.6 kg (193 lb 3.2 oz)    LMP  (LMP Unknown) Comment: no period in 2 yearsper pt and she denies being sexually active in over a year   BMI 37.73 kg/m  General: NAD Neck: JVP 10 cm with HJR, no thyromegaly or thyroid nodule.  Lungs: Clear to auscultation bilaterally with normal respiratory effort. CV: Nondisplaced PMI.  Heart regular S1/S2, no S3/S4, no murmur.  1+ edema 1/2 to knees.  No carotid bruit.  Normal pedal pulses.  Abdomen: Soft, nontender, no hepatosplenomegaly, no distention.  Skin: Intact without lesions or rashes.  Neurologic: Alert and oriented x 3.  Psych: Normal affect. Extremities: No clubbing or cyanosis.  HEENT: Normal.   Assessment/Plan: 1. Pulmonary hypertension: Echo in 11/22 showed EF 60-65%, mild LVH, severe RV enlargement with severely decreased RV systolic function, PASP 66 mmHg, severe RAE, moderate TR, moderate PR, small pericardial effusion. CTA chest showed no PE, normal lung fields and V/Q scan showed no evidence for chronic PE.  PFTs showed severe restriction and decreased DLCO.  RHC/LHC showed severe pulmonary arterial HTN with RV failure.  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  No history of liver disease.  She does have history of "inflammatory myopathy" and has had chronic mild CK elevation.  ANA, SCL-70 and RF negative with elevated CRP. 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  However, with restrictive lung disease on PFTs, she will need high resolution CT chest to rule out interstitial lung disease. On exam, she is still volume overloaded (RV failure).  6 minute walk was done today with significant impairment as well as exertional hypoxemia.  - I will arrange for home oxygen to use with exertion.  - Increase Lasix to 80 mg daily  with BMET/BNP today and BMET in 10 days.  - She will start Opsumit 10 mg daily.  In about a week, she will start on tadalafil 20 mg daily.   - She will need a sleep study, has been ordered by Dr. Radford Pax.  - Still need to check anti-centromere Ab to complete rheumatological workup.  She has an appointment with rheumatology.  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  She says that she was treated with prednisone in the past. She does not report myalgias or muscle pain.   - She has rheumatology appointment scheduled.   Followup in 2 months.   Loralie Champagne 05/31/2021

## 2021-06-04 ENCOUNTER — Ambulatory Visit: Payer: 59 | Admitting: Nurse Practitioner

## 2021-06-05 NOTE — Telephone Encounter (Signed)
Advanced Heart Failure Patient Advocate Encounter  Patient's Opsumit was triaged to [facility] and filled with co-pay card and mailed 02/08.  Charlann Boxer, CPhT

## 2021-06-08 ENCOUNTER — Other Ambulatory Visit (HOSPITAL_COMMUNITY): Payer: Self-pay

## 2021-06-08 ENCOUNTER — Telehealth: Payer: Self-pay | Admitting: *Deleted

## 2021-06-08 ENCOUNTER — Telehealth (HOSPITAL_COMMUNITY): Payer: Self-pay | Admitting: Pharmacy Technician

## 2021-06-08 NOTE — Telephone Encounter (Signed)
STILL PENDING Prior Authorization for PSG sent to [facility] via web portal  CASE # [id number]

## 2021-06-08 NOTE — Telephone Encounter (Signed)
Advanced Heart Failure Patient Advocate Encounter  Prior Authorization for Shannon [name] has been approved.    PA# [id number] Effective dates: 06/08/21 through 06/08/22  Charlann Boxer, CPhT

## 2021-06-08 NOTE — Telephone Encounter (Signed)
STILL PENDING Prior Authorization for PSG sent to [facility] via web portal

## 2021-06-08 NOTE — Telephone Encounter (Signed)
Patient Advocate Encounter   Received notification from [employer] that prior authorization for Tadalafil is required.   PA submitted on CoverMyMeds Key [id number] Status is pending   Will continue to follow.

## 2021-06-08 NOTE — Telephone Encounter (Signed)
-----  Message from Sueanne Margarita, MD sent at 05/28/2021  5:31 PM EST ----- Gae Bon I ordered a sleep study in lab for her but do not see that it was done or scheduled ----- Message ----- From: Brand Males, MD Sent: 05/28/2021   4:43 PM EST To: Sueanne Margarita, MD, Minette Brine, FNP

## 2021-06-09 ENCOUNTER — Ambulatory Visit (HOSPITAL_COMMUNITY)
Admission: RE | Admit: 2021-06-09 | Discharge: 2021-06-09 | Disposition: A | Discharge status: Home / Self Care | Payer: 59 | Source: Ambulatory Visit | Attending: Cardiology | Admitting: Cardiology

## 2021-06-09 ENCOUNTER — Other Ambulatory Visit: Payer: Self-pay

## 2021-06-09 DIAGNOSIS — I272 Pulmonary hypertension, unspecified: Secondary | ICD-10-CM | POA: Diagnosis present

## 2021-06-09 LAB — BASIC METABOLIC PANEL
Anion gap: 11 (ref 5–15)
BUN: 20 mg/dL (ref 6–20)
CO2: 30 mmol/L (ref 22–32)
Calcium: 9.7 mg/dL (ref 8.9–10.3)
Chloride: 98 mmol/L (ref 98–111)
Creatinine, Ser: 0.95 mg/dL (ref 0.44–1.00)
GFR, Estimated: >60 mL/min (ref >60)
Glucose, Bld: 87 mg/dL (ref 70–99)
Potassium: 4 mmol/L (ref 3.5–5.1)
Sodium: 139 mmol/L (ref 135–145)

## 2021-06-11 ENCOUNTER — Telehealth (HOSPITAL_COMMUNITY): Payer: Self-pay

## 2021-06-11 NOTE — Telephone Encounter (Signed)
[facility] have been trying to reach patient to set up home oxygen. They have tried multiple times with out success. They are cancelling her home oxygen order. Dr. [name] is aware.
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
## 2021-06-12 NOTE — Telephone Encounter (Signed)
PSG Denied /Not Approved  Ref# [id number]  Peer to Peer [phone]

## 2021-06-17 ENCOUNTER — Ambulatory Visit: Payer: 59 | Admitting: Rheumatology

## 2021-06-17 ENCOUNTER — Encounter: Payer: Self-pay | Admitting: Rheumatology

## 2021-06-17 ENCOUNTER — Other Ambulatory Visit: Payer: Self-pay

## 2021-06-17 VITALS — BP 109/77 | HR 93 | Ht 60.0 in | Wt 189.6 lb

## 2021-06-17 DIAGNOSIS — E782 Mixed hyperlipidemia: Secondary | ICD-10-CM

## 2021-06-17 DIAGNOSIS — Z79899 Other long term (current) drug therapy: Secondary | ICD-10-CM

## 2021-06-17 DIAGNOSIS — R0602 Shortness of breath: Secondary | ICD-10-CM

## 2021-06-17 DIAGNOSIS — G7249 Other inflammatory and immune myopathies, not elsewhere classified: Secondary | ICD-10-CM | POA: Diagnosis not present

## 2021-06-17 DIAGNOSIS — R7982 Elevated C-reactive protein (CRP): Secondary | ICD-10-CM

## 2021-06-17 DIAGNOSIS — I272 Pulmonary hypertension, unspecified: Secondary | ICD-10-CM | POA: Diagnosis not present

## 2021-06-17 DIAGNOSIS — I10 Essential (primary) hypertension: Secondary | ICD-10-CM

## 2021-06-17 DIAGNOSIS — R9389 Abnormal findings on diagnostic imaging of other specified body structures: Secondary | ICD-10-CM

## 2021-06-17 DIAGNOSIS — E118 Type 2 diabetes mellitus with unspecified complications: Secondary | ICD-10-CM

## 2021-06-23 ENCOUNTER — Encounter (HOSPITAL_COMMUNITY): Payer: Self-pay | Admitting: Cardiology

## 2021-06-24 ENCOUNTER — Other Ambulatory Visit (HOSPITAL_COMMUNITY): Payer: Self-pay | Admitting: *Deleted

## 2021-06-24 MED ORDER — TADALAFIL (PAH) 20 MG PO TABS: 20.0000 mg | ORAL_TABLET | Freq: Every day | ORAL | 3 refills | Status: DC

## 2021-06-24 MED ORDER — FUROSEMIDE 80 MG PO TABS: 80.0000 mg | ORAL_TABLET | Freq: Every day | ORAL | 3 refills | Status: DC

## 2021-06-25 NOTE — Progress Notes (Signed)
I will discuss results at the follow-up visit.

## 2021-06-26 LAB — COMPLETE METABOLIC PANEL WITH GFR
AG Ratio: 1 (calc) (ref 1.0–2.5)
ALT: 15 U/L (ref 6–29)
AST: 23 U/L (ref 10–35)
Albumin: 3.9 g/dL (ref 3.6–5.1)
Alkaline phosphatase (APISO): 132 U/L (ref 37–153)
BUN: 25 mg/dL (ref 7–25)
CO2: 28 mmol/L (ref 20–32)
Calcium: 9.8 mg/dL (ref 8.6–10.4)
Chloride: 99 mmol/L (ref 98–110)
Creat: 0.96 mg/dL (ref 0.50–1.03)
Globulin: 3.9 g/dL (calc) — ABNORMAL HIGH (ref 1.9–3.7)
Glucose, Bld: 87 mg/dL (ref 65–99)
Potassium: 4.7 mmol/L (ref 3.5–5.3)
Sodium: 141 mmol/L (ref 135–146)
Total Bilirubin: 2.8 mg/dL — ABNORMAL HIGH (ref 0.2–1.2)
Total Protein: 7.8 g/dL (ref 6.1–8.1)
eGFR: 71 mL/min/{1.73_m2} (ref >=60)

## 2021-06-26 LAB — MYOSITIS ASSESSR PLUS JO-1 AUTOABS
EJ Autoabs: NOT DETECTED
Jo-1 Autoabs: <1 AI
Ku Autoabs: NOT DETECTED
Mi-2 Autoabs: NOT DETECTED
OJ Autoabs: NOT DETECTED
PL-12 Autoabs: NOT DETECTED
PL-7 Autoabs: NOT DETECTED
SRP Autoabs: NOT DETECTED

## 2021-06-26 LAB — QUANTIFERON-TB GOLD PLUS
Mitogen-NIL: >10 IU/mL
NIL: 0.03 IU/mL
QuantiFERON-TB Gold Plus: NEGATIVE
TB1-NIL: 0 IU/mL
TB2-NIL: 0 IU/mL

## 2021-06-26 LAB — PROTEIN ELECTROPHORESIS, SERUM, WITH REFLEX
Albumin ELP: 3.8 g/dL (ref 3.8–4.8)
Alpha 1: 0.4 g/dL — ABNORMAL HIGH (ref 0.2–0.3)
Alpha 2: 0.7 g/dL (ref 0.5–0.9)
Beta 2: 0.5 g/dL (ref 0.2–0.5)
Beta Globulin: 0.5 g/dL (ref 0.4–0.6)
Gamma Globulin: 1.7 g/dL (ref 0.8–1.7)
Total Protein: 7.6 g/dL (ref 6.1–8.1)

## 2021-06-26 LAB — IGG, IGA, IGM
IgG (Immunoglobin G), Serum: 1832 mg/dL — ABNORMAL HIGH (ref 600–1640)
IgM, Serum: 71 mg/dL (ref 50–300)
Immunoglobulin A: 516 mg/dL — ABNORMAL HIGH (ref 47–310)

## 2021-06-26 LAB — THIOPURINE METHYLTRANSFERASE (TPMT), RBC: Thiopurine Methyltransferase, RBC: 17 nmol/hr/mL RBC

## 2021-06-26 LAB — HEPATITIS B SURFACE ANTIGEN: Hepatitis B Surface Ag: NONREACTIVE

## 2021-06-26 LAB — HEPATITIS B CORE ANTIBODY, IGM: Hep B C IgM: NONREACTIVE

## 2021-06-26 LAB — HMGCR AB (IGG): HMGCR AB (IGG): <2 CU (ref <20)

## 2021-06-26 LAB — ALDOLASE: Aldolase: 5.2 U/L (ref <=8.1)

## 2021-06-26 LAB — HEPATITIS C ANTIBODY
Hepatitis C Ab: NONREACTIVE
SIGNAL TO CUT-OFF: 0.05 (ref <1.00)

## 2021-06-26 LAB — CK: Total CK: 172 U/L — ABNORMAL HIGH (ref 29–143)
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
## 2021-07-03 ENCOUNTER — Ambulatory Visit: Payer: 59 | Admitting: Rheumatology

## 2021-07-06 ENCOUNTER — Ambulatory Visit (INDEPENDENT_AMBULATORY_CARE_PROVIDER_SITE_OTHER)
Admission: RE | Admit: 2021-07-06 | Discharge: 2021-07-06 | Disposition: A | Discharge status: Home / Self Care | Payer: 59 | Source: Ambulatory Visit | Attending: Internal Medicine | Admitting: Internal Medicine

## 2021-07-06 ENCOUNTER — Other Ambulatory Visit: Payer: Self-pay

## 2021-07-06 ENCOUNTER — Telehealth (HOSPITAL_COMMUNITY): Payer: Self-pay | Admitting: *Deleted

## 2021-07-06 DIAGNOSIS — R0609 Other forms of dyspnea: Secondary | ICD-10-CM | POA: Diagnosis not present

## 2021-07-06 NOTE — Telephone Encounter (Signed)
Left detailed vm °

## 2021-07-06 NOTE — Progress Notes (Signed)
IMPRESSION: ?1. No evidence of fibrotic interstitial lung disease ?2. Cardiomegaly and small pericardial effusion. ?3. Mild aortic Atherosclerosis (ICD10-I70.0). ? ?Pls keep visit with Roxan Diesel to discuss this

## 2021-07-06 NOTE — Telephone Encounter (Signed)
Pt left vm stating she started tadalafil and about a week ago and has experienced some light headedness and fatigue. Pt just wanted to report this to [facility] [place].   ?

## 2021-07-06 NOTE — Telephone Encounter (Signed)
Need to get BP check, can do nurse visit.  Make sure she has office followup.  ?

## 2021-07-10 ENCOUNTER — Telehealth (INDEPENDENT_AMBULATORY_CARE_PROVIDER_SITE_OTHER): Payer: 59 | Admitting: Nurse Practitioner

## 2021-07-10 ENCOUNTER — Encounter: Payer: Self-pay | Admitting: Nurse Practitioner

## 2021-07-10 ENCOUNTER — Other Ambulatory Visit: Payer: Self-pay

## 2021-07-10 DIAGNOSIS — G7249 Other inflammatory and immune myopathies, not elsewhere classified: Secondary | ICD-10-CM | POA: Diagnosis not present

## 2021-07-10 DIAGNOSIS — I272 Pulmonary hypertension, unspecified: Secondary | ICD-10-CM | POA: Diagnosis not present

## 2021-07-10 DIAGNOSIS — R0609 Other forms of dyspnea: Secondary | ICD-10-CM

## 2021-07-10 NOTE — Assessment & Plan Note (Signed)
Recently seen by rheumatology with full work-up.  Plans to follow-up next week. ?

## 2021-07-10 NOTE — Progress Notes (Signed)
? ?Patient ID: Shannon [name], female     DOB: 10-May-1965, 56 y.o.      MRN: [id number] ? ?Chief Complaint  ?Patient presents with  ? Follow-up  ?  CT results  ?  ? ? ?Virtual Visit via Video Note ? ?I connected with Shannon [name] on 07/10/21 at  4:00 PM EDT by a video enabled telemedicine application and verified that I am speaking with the correct person using two identifiers. ? ?Location: ?Patient: Home ?Provider: Office ?  ?I discussed the limitations of evaluation and management by telemedicine and the availability of in person appointments. The patient expressed understanding and agreed to proceed. ? ?History of Present Illness: ?56 year old female, never smoker followed for DOE and restrictive lung disease.  She is a patient of Dr. Golden Pop and last seen in office on 05/28/2021.  She has a history of inflammatory myopathy diagnosed with muscle biopsy in 2012.  Started on high-dose prednisone at the time.  She was tapered off prednisone by summer 2013.  After that she was doing well then presented to Dr. Radford Pax with cardiology in November 2022 with complaints of lower extremity edema since August 2022.  Echocardiogram showed significant pulmonary hypertension with PASP 66 mmHg.  Also moderate mitral and tricuspid regurgitation.  She was referred to rheumatology and and has since been seen by Dr. Estanislado Pandy.  She had pulmonary function testing in December 2022 which showed severe restriction and diminished diffusion.  There was concern for possible interstitial lung disease so she was referred to pulmonology.  Past medical history significant for hypertension, DM 2, HLD. ? ?05/28/2021: OV with Dr. Chase Caller.  No findings consistent with ILD on CTA chest.  Will order HRCT for further evaluation.  Suspect shortness of breath is likely related to severe pulmonary hypertension.  She has not started on treatment for this.  Plans to follow-up with Dr. [name].  Follow-up with app after CT scan and then 6 months  with Dr. Chase Caller if no evidence of ILD. ? ?07/10/2021: Today follow-up ?Patient presents today to discuss HRCT results.  HRCT did not show any evidence of interstitial lung disease.  Did have some very mild cylindrical bronchiectasis.  Patient denies any persistent cough.  Does continue to have some shortness of breath upon exertion however she recently started Adcirca for pulmonary hypertension.  Reports doing overall well with this although she has had some dizziness which she is following up with cardiology about.  Feels as though it is just taking some time to get used to.  Edema is overall stable.  No other respiratory concerns. ? ?No Known Allergies ?Immunization History  ?Administered Date(s) Administered  ? Influenza,inj,Quad PF,6+ Mos 02/28/2019, 03/03/2020, 03/05/2021  ? PFIZER(Purple Top)SARS-COV-2 Vaccination 10/20/2019, 11/10/2019  ? Pneumococcal Polysaccharide-23 02/28/2019  ? Tdap 06/07/2011  ? ?Past Medical History:  ?Diagnosis Date  ? Diabetes ([place])   ? Hypertension   ? Inflammatory myopathy   ? treated 2012  ? Wears glasses   ? reading  ? ? ?Tobacco History: ?Social History  ? ?Tobacco Use  ?Smoking Status Never  ? Passive exposure: Current  ?Smokeless Tobacco Never  ? ?Counseling given: Not Answered ? ? ?Outpatient Medications Prior to Visit  ?Medication Sig Dispense Refill  ? Cholecalciferol (VITAMIN D) 125 MCG (5000 UT) CAPS Take 5,000 Units by mouth daily.    ? furosemide (LASIX) 80 MG tablet Take 1 tablet (80 mg total) by mouth daily. 30 tablet 3  ? macitentan (OPSUMIT) 10 MG tablet Take 10  mg by mouth daily.    ? metFORMIN (GLUCOPHAGE) 1000 MG tablet TAKE 1 TABLET BY MOUTH TWICE DAILY WITH A MEAL 180 tablet 0  ? potassium chloride SA (KLOR-CON) 20 MEQ tablet Take 1 tablet (20 mEq total) by mouth daily. 90 tablet 3  ? tadalafil, PAH, (ADCIRCA) 20 MG tablet Take 1 tablet (20 mg total) by mouth daily. 30 tablet 3  ? valsartan (DIOVAN) 160 MG tablet Take 1 tablet (160 mg total) by mouth daily.  90 tablet 3  ? atorvastatin (LIPITOR) 20 MG tablet Take 1 tablet (20 mg total) by mouth daily. (Patient not taking: Reported on 05/29/2021) 90 tablet 1  ? ?No facility-administered medications prior to visit.  ? ?  ?Review of Systems:  ? ?Constitutional: No weight loss or gain, night sweats, fevers, chills, fatigue, or lassitude. ?CV:  +swelling in lower extremities (stable). No chest pain, orthopnea, PND,  anasarca, dizziness, palpitations, syncope ?Resp: +shortness of breath with exertion (baseline). No excess mucus or change in color of mucus. No productive or non-productive. No hemoptysis. No wheezing.  No chest wall deformity ?Skin: No rash, lesions, ulcerations ?MSK:  No joint pain or swelling.  No decreased range of motion.  No back pain. ?Neuro: No dizziness or lightheadedness.  ?Psych: No depression or anxiety. Mood stable.  ? ?Observations/Objective: ?Patient is well-developed, well-nourished in no acute distress. A&Ox3. Resting comfortably at home. Unlabored breathing. Speech is clear and coherent with logical content.  ? ?07/06/2021 HRCT chest: Mild cardiomegaly.  Small pericardial effusion which was noted on previous echo.  Central airways are patent.  No evidence of air trapping.  Mild linear opacities of the right upper and lower lobe.  Likely due to scarring or atelectasis.  Mild cylindrical bronchiectasis, most prominent in the right middle lobe, likely sequela or prior infection or aspiration.  Basilar opacities resolved with prone imaging and are compatible with atelectasis.  No reticular opacities, architectural distortion, traction bronchiectasis or honeycombing to indicate ILD. ? ?Assessment and Plan: ?Pulmonary hypertension ([place]) ?Severe pulmonary hypertension recently started on Adcirca.  Suspect DOE primarily related to this.  Follow-up with cardiology as scheduled. ? ?Inflammatory myopathy ?Recently seen by rheumatology with full work-up.  Plans to follow-up next week. ? ?DOE (dyspnea on  exertion) ?No evidence of ILD on HRCT.  Suspect symptoms primarily related to pulmonary hypertension.  We will follow-up in 6 months or as needed with patient ? ? ? ?Follow Up Instructions: Follow up in 6 months with Dr. Chase Caller. If symptoms do not improve or worsen, please contact office for sooner follow up or seek emergency care. ? ?  ?I discussed the assessment and treatment plan with the patient. The patient was provided an opportunity to ask questions and all were answered. The patient agreed with the plan and demonstrated an understanding of the instructions. ?  ?The patient was advised to call back or seek an in-person evaluation if the symptoms worsen or if the condition fails to improve as anticipated. ? ?I provided 25 minutes of non-face-to-face time during this encounter. ? ? ?Clayton Bibles, NP  ? ? ?

## 2021-07-10 NOTE — Assessment & Plan Note (Signed)
Severe pulmonary hypertension recently started on Adcirca.  Suspect DOE primarily related to this.  Follow-up with cardiology as scheduled. ?

## 2021-07-10 NOTE — Assessment & Plan Note (Signed)
No evidence of ILD on HRCT.  Suspect symptoms primarily related to pulmonary hypertension.  We will follow-up in 6 months or as needed with patient ?

## 2021-07-13 NOTE — Telephone Encounter (Addendum)
Not Covered/ ?Not Approved ?

## 2021-07-14 DIAGNOSIS — Z8739 Personal history of other diseases of the musculoskeletal system and connective tissue: Secondary | ICD-10-CM

## 2021-07-15 NOTE — Telephone Encounter (Signed)
AUTH IS PENDING AGAIN ?

## 2021-07-16 ENCOUNTER — Ambulatory Visit: Payer: 59 | Admitting: Rheumatology

## 2021-07-16 DIAGNOSIS — R748 Abnormal levels of other serum enzymes: Secondary | ICD-10-CM

## 2021-07-16 DIAGNOSIS — G7249 Other inflammatory and immune myopathies, not elsewhere classified: Secondary | ICD-10-CM

## 2021-07-16 DIAGNOSIS — R0602 Shortness of breath: Secondary | ICD-10-CM

## 2021-07-16 DIAGNOSIS — I10 Essential (primary) hypertension: Secondary | ICD-10-CM

## 2021-07-16 DIAGNOSIS — I272 Pulmonary hypertension, unspecified: Secondary | ICD-10-CM

## 2021-07-16 DIAGNOSIS — E782 Mixed hyperlipidemia: Secondary | ICD-10-CM

## 2021-07-16 DIAGNOSIS — E118 Type 2 diabetes mellitus with unspecified complications: Secondary | ICD-10-CM
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
## 2021-07-22 NOTE — Telephone Encounter (Signed)
APPROVED AUTH# [id number] ?Ok to schedule ?

## 2021-07-30 ENCOUNTER — Encounter (HOSPITAL_COMMUNITY): Payer: Self-pay | Admitting: Cardiology

## 2021-07-30 ENCOUNTER — Encounter: Payer: Self-pay | Admitting: Rheumatology

## 2021-07-30 ENCOUNTER — Ambulatory Visit: Payer: 59 | Admitting: Rheumatology

## 2021-07-30 ENCOUNTER — Ambulatory Visit (HOSPITAL_COMMUNITY)
Admission: RE | Admit: 2021-07-30 | Discharge: 2021-07-30 | Disposition: A | Discharge status: Home / Self Care | Payer: 59 | Source: Ambulatory Visit | Attending: Cardiology | Admitting: Cardiology

## 2021-07-30 VITALS — BP 90/50 | HR 83 | Wt 205.8 lb

## 2021-07-30 VITALS — BP 98/65 | HR 88 | Ht 60.0 in | Wt 206.6 lb

## 2021-07-30 DIAGNOSIS — R0602 Shortness of breath: Secondary | ICD-10-CM | POA: Diagnosis not present

## 2021-07-30 DIAGNOSIS — E118 Type 2 diabetes mellitus with unspecified complications: Secondary | ICD-10-CM

## 2021-07-30 DIAGNOSIS — I2781 Cor pulmonale (chronic): Secondary | ICD-10-CM | POA: Diagnosis not present

## 2021-07-30 DIAGNOSIS — I3139 Other pericardial effusion (noninflammatory): Secondary | ICD-10-CM | POA: Insufficient documentation

## 2021-07-30 DIAGNOSIS — E119 Type 2 diabetes mellitus without complications: Secondary | ICD-10-CM | POA: Insufficient documentation

## 2021-07-30 DIAGNOSIS — I2721 Secondary pulmonary arterial hypertension: Secondary | ICD-10-CM | POA: Diagnosis not present

## 2021-07-30 DIAGNOSIS — Z79899 Other long term (current) drug therapy: Secondary | ICD-10-CM | POA: Diagnosis not present

## 2021-07-30 DIAGNOSIS — M7989 Other specified soft tissue disorders: Secondary | ICD-10-CM | POA: Diagnosis not present

## 2021-07-30 DIAGNOSIS — I272 Pulmonary hypertension, unspecified: Secondary | ICD-10-CM

## 2021-07-30 DIAGNOSIS — R0902 Hypoxemia: Secondary | ICD-10-CM | POA: Diagnosis not present

## 2021-07-30 DIAGNOSIS — E782 Mixed hyperlipidemia: Secondary | ICD-10-CM

## 2021-07-30 DIAGNOSIS — Z7952 Long term (current) use of systemic steroids: Secondary | ICD-10-CM | POA: Diagnosis not present

## 2021-07-30 DIAGNOSIS — R0609 Other forms of dyspnea: Secondary | ICD-10-CM | POA: Insufficient documentation

## 2021-07-30 DIAGNOSIS — I10 Essential (primary) hypertension: Secondary | ICD-10-CM | POA: Diagnosis not present

## 2021-07-30 DIAGNOSIS — G7249 Other inflammatory and immune myopathies, not elsewhere classified: Secondary | ICD-10-CM | POA: Diagnosis not present

## 2021-07-30 DIAGNOSIS — I50810 Right heart failure, unspecified: Secondary | ICD-10-CM | POA: Diagnosis not present

## 2021-07-30 LAB — BASIC METABOLIC PANEL
Anion gap: 9 (ref 5–15)
BUN: 44 mg/dL — ABNORMAL HIGH (ref 6–20)
CO2: 25 mmol/L (ref 22–32)
Calcium: 9.5 mg/dL (ref 8.9–10.3)
Chloride: 105 mmol/L (ref 98–111)
Creatinine, Ser: 1.69 mg/dL — ABNORMAL HIGH (ref 0.44–1.00)
GFR, Estimated: 36 mL/min — ABNORMAL LOW (ref >60)
Glucose, Bld: 74 mg/dL (ref 70–99)
Potassium: 4.9 mmol/L (ref 3.5–5.1)
Sodium: 139 mmol/L (ref 135–145)

## 2021-07-30 LAB — BRAIN NATRIURETIC PEPTIDE: B Natriuretic Peptide: 852.5 pg/mL — ABNORMAL HIGH (ref 0.0–100.0)

## 2021-07-30 MED ORDER — TORSEMIDE 20 MG PO TABS: 40.0000 mg | ORAL_TABLET | Freq: Two times a day (BID) | ORAL | 3 refills | Status: DC

## 2021-07-30 NOTE — Progress Notes (Signed)
SATURATION QUALIFICATIONS: (This note is used to comply with regulatory documentation for home oxygen) ? ?Patient Saturations on Room Air at Rest = 82% ? ?Patient Saturations on Room Air while Ambulating = unable to do % ? ?Patient Saturations on 2 Liters of oxygen while Ambulating = 94% ? ?Please briefly explain why patient needs home oxygen: ? ? Unable to ambulate without oxygen due to shortness of breath ?

## 2021-07-30 NOTE — Progress Notes (Signed)
Order for oxygen faxed to [facility] on 07/30/2021 ?

## 2021-07-30 NOTE — Patient Instructions (Signed)
Medication Changes: ? ?Stop Lasix ? ?Start Torsemide 60 mg Twice daily for 3 days, then 40 mg Twice daily ? ? ?Lab Work: ? ?Labs done today, your results will be available in MyChart, we will contact you for abnormal readings. ? ? ?Testing/Procedures: ? ?Repeat blood work in 2 weeks ? ?Referrals: ? ?none ? ?Special Instructions // Education: ? ?Wear compression stockings ? ?Follow-Up in: 2 weeks with the nurse practitioners, then 1 month with Dr. [name]. ? ? ?At the [facility], you and your health needs are our priority. We have a designated team specialized in the treatment of Heart Failure. This Care Team includes your primary Heart Failure Specialized Cardiologist (physician), Advanced Practice Providers (APPs- Physician Assistants and Nurse Practitioners), and Pharmacist who all work together to provide you with the care you need, when you need it.  ? ?You may see any of the following providers on your designated Care Team at your next follow up: ? ?Dr Glori Bickers ?Dr Loralie Champagne ?Darrick Grinder, NP ?Lyda Jester, PA ?Jessica Milford,NP ?Marlyce Huge, PA ?Audry Riles, PharmD ? ? ?Please be sure to bring in all your medications bottles to every appointment.  ? ?Need to Contact us: ? ?If you have any questions or concerns before your next appointment please send [place] a message through [place] or call our office at [phone].   ? ?TO LEAVE A MESSAGE FOR THE NURSE SELECT OPTION 2, PLEASE LEAVE A MESSAGE INCLUDING: ?YOUR NAME ?DATE OF BIRTH ?CALL BACK NUMBER ?REASON FOR CALL**this is important as we prioritize the call backs ? ?YOU WILL RECEIVE A CALL BACK THE SAME DAY AS LONG AS YOU CALL BEFORE 4:00 PM ? ? ?

## 2021-07-31 ENCOUNTER — Telehealth (HOSPITAL_COMMUNITY): Payer: Self-pay | Admitting: Surgery

## 2021-07-31 DIAGNOSIS — I50810 Right heart failure, unspecified: Secondary | ICD-10-CM

## 2021-07-31 NOTE — Telephone Encounter (Signed)
-----  Message from Larey Dresser, MD sent at 07/31/2021  2:52 PM EDT ----- ?Stop valsartan.  BMET early next week.  ?

## 2021-07-31 NOTE — Telephone Encounter (Signed)
Patient called in reference to lab results and recommendations per Dr. [name]. She will return on next Tuesday for lab recheck.  Medication list updated in [facility]. ?

## 2021-07-31 NOTE — Progress Notes (Signed)
PCP: Minette Brine, FNP ?Cardiology: Dr. Radford Pax ?HF Cardiology: Dr. [name] ? ?56 y.o. with history of HTN and diabetes as well as remote inflammatory myopathy was referred by Dr. Radford Pax for evaluation of pulmonary hypertension.  Patient was referred by PCP to Dr. Radford Pax in 8/22 for peripheral edema.  She has noted dyspnea if she walks fast for several months.     ? ?She has a chronic mild elevation in CKD and was treated "years ago" with prednisone for an inflammatory myopathy.  ? ?Echo was done by Dr. Radford Pax, showing EF 60-65%, mild LVH, severe RV enlargement with severely decreased RV systolic function, PASP 66 mmHg, severe RAE, moderate TR, moderate PR, small pericardial effusion.  CTA chest showed no PE, no significant lung abnormalities.  V/Q scan showed no chronic PE. I did LHC/RHC in 1/23 that showed normal coronaries, elevated right heart filling pressures, severe pulmonary arterial hypertension, and normal PCWP.  ? ?She was seen by rheumatology for "inflammatory myopathy."  Serologic workup was unremarkable. She was not thought to have active rheumatologic disease.  ? 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  She has had more swelling in legs and abdomen after increasing tadalafil.  No lightheadedness though BP is soft.  SHe is short of breath walking around the house. She still does not have home oxygen, oxygen saturation is 82% today on room air (up to 94% on 2L [place]).  Weight is up 13 lbs.  ? ?Labs (11/22): CK 252, CRP 26, ESR 12, RF negative, ANA negative, K 4.3, creatinine 0.9 ?Labs (1/23): SCL-70 negative, hgb 13.4, BNP 1288, K 41, creatinine 0.93 ?Labs (2/23): BNP 1193, anti-Jo-1 negative, K 4.7, creatinine 0.96, myositis panel negative, SPEP negative ? ?6 minute walk (1/23): 152 m with oxygen saturation < 88% with ambulation ?6 minute walk (2/23): 129 m, oxygen saturation 84% with ambulation ? ?PMH: ?1. Type 2 diabetes ?2. 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  ?4. Pulmonary hypertension: Echo (11/22) with EF 60-65%, mild LVH, severe RV enlargement with severely decreased RV systolic function, PASP 66 mmHg, severe RAE, moderate TR, moderate PR, small pericardial effusion.  ?- CTA chest: No PE, lungs clear.  ?- V/Q scan: No evidence for chronic PE.  ?- PFTs (12/22): Severe restriction, decreased DLCO. ?- RHC/LHC (1/23): No coronary disease; mean RA 21, PA 70/35 mean 46, mean PCWP 4, CI 2.52, PVR 8.6 WU, PAPI 1.7 ?- High resolution CT chest (3/23): No ILD.  ? ?Social History  ? ?Socioeconomic History  ? Marital status: Single  ?  Spouse name: Not on file  ? Number of children: 1  ? Years of education: College  ? Highest education level: Not on file  ?Occupational History  ?  Comment: [employer]  ?Tobacco Use  ? Smoking status: Never  ?  Passive exposure: Past  ? Smokeless tobacco: Never  ?Vaping Use  ? Vaping Use: Never used  ?Substance and Sexual Activity  ? Alcohol use: No  ? Drug use: No  ? Sexual activity: Not on file  ?Other Topics Concern  ? Not on file  ?Social History Narrative  ? Patient lives at home with her daughter.  ? Caffeine Use: 1 cup daily  ? ?Social Determinants of Health  ? ?Financial Resource Strain: Not on file  ?Food Insecurity: Not on file  ?Transportation Needs: Not on file  ?Physical Activity: Not on file  ?Stress:  Not on file  ?Social Connections: Not on file  ?Intimate Partner Violence: Not on file  ? ?Family History  ?Problem Relation Age of Onset  ? Hypertension Mother   ? Diabetes Mother   ? Hypertension Father   ? Heart disease Other   ? Hypertension Other   ? Diabetes Other   ? Healthy Daughter   ? ?ROS: All systems reviewed and negative except as per HPI ? ?Current Outpatient Medications  ?Medication Sig Dispense Refill  ? Cholecalciferol (VITAMIN D) 125 MCG (5000 UT) CAPS Take 5,000 Units by mouth daily.    ? macitentan (OPSUMIT) 10 MG tablet Take 10 mg by mouth  daily.    ? metFORMIN (GLUCOPHAGE) 1000 MG tablet TAKE 1 TABLET BY MOUTH TWICE DAILY WITH A MEAL 180 tablet 0  ? potassium chloride SA (KLOR-CON) 20 MEQ tablet Take 1 tablet (20 mEq total) by mouth daily. 90 tablet 3  ? tadalafil, PAH, (ADCIRCA) 20 MG tablet Take 1 tablet (20 mg total) by mouth daily. 30 tablet 3  ? torsemide (DEMADEX) 20 MG tablet Take 2 tablets (40 mg total) by mouth 2 (two) times daily. 180 tablet 3  ? ?No current facility-administered medications for this encounter.  ? ?BP (!) 90/50   Pulse 83   Wt 93.4 kg (205 lb 12.8 oz)   SpO2 94% Comment: 2l N/C  BMI 40.19 kg/m?  ?General: NAD ?Neck: JVP 12 cm, no thyromegaly or thyroid nodule.  ?Lungs: Clear to auscultation bilaterally with normal respiratory effort. ?CV: Nondisplaced PMI.  Heart regular S1/S2, no S3/S4, no murmur.  2+ edema to knees.  No carotid bruit.  Normal pedal pulses.  ?Abdomen: Soft, nontender, no hepatosplenomegaly, no distention.  ?Skin: Intact without lesions or rashes.  ?Neurologic: Alert and oriented x 3.  ?Psych: Normal affect. ?Extremities: No clubbing or cyanosis.  ?HEENT: Normal.  ? ?Assessment/Plan: ?1. Pulmonary hypertension/RV failure: Echo in 11/22 showed EF 60-65%, mild LVH, severe RV enlargement with severely decreased RV systolic function, PASP 66 mmHg, severe RAE, moderate TR, moderate PR, small pericardial effusion. CTA chest showed no PE, normal lung fields and V/Q scan showed no evidence for chronic PE.  PFTs showed severe restriction and decreased DLCO.  RHC/LHC showed severe pulmonary arterial HTN with RV failure.  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  No history of liver disease.  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  ANA, SCL-70 and RF negative with elevated CRP. She may have connective tissue disease-related PAH though she saw rheumatology and no definite diagnosis was given.  High resolution CT  chest showed no evidence for ILD.  She is volume overloaded today with NYHA class III symptoms. She is hypoxemic at rest, oxygen saturation 82% on room air.  ?- She needs home oxygen, I will try again to arrange.  ?- Stop Lasix, start torsemide 60 mg bid x 3 days then 40 mg bid.  BMET/BNP today and in 1 week.  ?- I will give her a prescription for compression stockings.  ?- Continue Opsumit 10 mg daily.  ?- Continue tadalafil 20 mg daily.  Would not increase until volume overload is better-controlled.  ?- Needs sleep study.  ?- Still need to check anti-centromere Ab to complete rheumatological workup.  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  Muscle biopsy in 1/12 showed "inflammatory myopathy."  She says that she was treated  with prednisone in the past. She does not report myalgias or muscle pain.  Myositis panel was negative.  She saw rheumatology and no definite diagnosis was given.  ? ?Followup in 2 wks with APP and 1 month with me.  ? ?Loralie Champagne ?07/31/2021 ? ? ?

## 2021-08-10 ENCOUNTER — Other Ambulatory Visit (HOSPITAL_COMMUNITY): Payer: 59

## 2021-08-11 ENCOUNTER — Ambulatory Visit (HOSPITAL_COMMUNITY)
Admission: RE | Admit: 2021-08-11 | Discharge: 2021-08-11 | Disposition: A | Discharge status: Home / Self Care | Payer: 59 | Source: Ambulatory Visit | Attending: Cardiology | Admitting: Cardiology

## 2021-08-11 DIAGNOSIS — I50810 Right heart failure, unspecified: Secondary | ICD-10-CM | POA: Insufficient documentation

## 2021-08-11 LAB — BASIC METABOLIC PANEL
Anion gap: 11 (ref 5–15)
BUN: 44 mg/dL — ABNORMAL HIGH (ref 6–20)
CO2: 29 mmol/L (ref 22–32)
Calcium: 9.3 mg/dL (ref 8.9–10.3)
Chloride: 97 mmol/L — ABNORMAL LOW (ref 98–111)
Creatinine, Ser: 1.38 mg/dL — ABNORMAL HIGH (ref 0.44–1.00)
GFR, Estimated: 46 mL/min — ABNORMAL LOW (ref >60)
Glucose, Bld: 132 mg/dL — ABNORMAL HIGH (ref 70–99)
Potassium: 3.6 mmol/L (ref 3.5–5.1)
Sodium: 137 mmol/L (ref 135–145)

## 2021-08-19 NOTE — Progress Notes (Signed)
PCP: Minette Brine, FNP ?Cardiology: Dr. Radford Pax ?HF Cardiology: Dr. [name] ? ?56 y.o. with history of HTN and diabetes as well as remote inflammatory myopathy was referred by Dr. Radford Pax for evaluation of pulmonary hypertension.  Patient was referred by PCP to Dr. Radford Pax in 8/22 for peripheral edema.  She has noted dyspnea if she walks fast for several months.     ? ?She has a chronic mild elevation in CKD and was treated "years ago" with prednisone for an inflammatory myopathy.  ? ?Echo 11/22 showed EF 60-65%, mild LVH, severe RV enlargement with severely decreased RV systolic function, PASP 66 mmHg, severe RAE, moderate TR, moderate PR, small pericardial effusion.  CTA chest showed no PE, no significant lung abnormalities.  V/Q scan showed no chronic PE. I did LHC/RHC in 1/23 that showed normal coronaries, elevated right heart filling pressures, severe pulmonary arterial hypertension, and normal PCWP.  ? ?She was seen by rheumatology for "inflammatory myopathy."  Serologic workup was unremarkable. She was not thought to have active rheumatologic disease.  ? ?Follow up 4/23, volume overloaded and hypoxic. She felt swelling increased after increasing tadalafil. Lasix switched to torsemide and arranged for home oxygen, oxygen saturation 82% on room air. Valsartan stopped after SCr elevated at 1.69. ? ?Today she returns for HF follow up with her daughter and grand-children. Overall feeling fine. She has dyspnea with housework or walking further distances outside, but generally does OK with ADLs. Continues to have LE swelling but has improved from last visit. Denies palpitations, CP, dizziness, or PND/Orthopnea. Appetite ok. No fever or chills. Wearing 2L oxygen. Taking all medications. Weight down 7 lbs. ? ? ?Labs (11/22): CK 252, CRP 26, ESR 12, RF negative, ANA negative, K 4.3, creatinine 0.9 ?Labs (1/23): SCL-70 negative, hgb 13.4, BNP 1288, K 41, creatinine 0.93 ?Labs (2/23): BNP 1193, anti-Jo-1 negative, K 4.7,  creatinine 0.96, myositis panel negative, SPEP negative ?Labs (4/23): K 3.6, creatinine 1.38 ? ?6 minute walk (1/23): 152 m with oxygen saturation < 88% with ambulation ?6 minute walk (2/23): 129 m, oxygen saturation 84% with ambulation ? ?PMH: ?1. Type 2 diabetes ?2. 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  ?4. Pulmonary hypertension: Echo (11/22) with EF 60-65%, mild LVH, severe RV enlargement with severely decreased RV systolic function, PASP 66 mmHg, severe RAE, moderate TR, moderate PR, small pericardial effusion.  ?- CTA chest: No PE, lungs clear.  ?- V/Q scan: No evidence for chronic PE.  ?- PFTs (12/22): Severe restriction, decreased DLCO. ?- RHC/LHC (1/23): No coronary disease; mean RA 21, PA 70/35 mean 46, mean PCWP 4, CI 2.52, PVR 8.6 WU, PAPI 1.7 ?- High resolution CT chest (3/23): No ILD.  ? ?Social History  ? ?Socioeconomic History  ? Marital status: Single  ?  Spouse name: Not on file  ? Number of children: 1  ? Years of education: College  ? Highest education level: Not on file  ?Occupational History  ?  Comment: [employer]  ?Tobacco Use  ? Smoking status: Never  ?  Passive exposure: Past  ? Smokeless tobacco: Never  ?Vaping Use  ? Vaping Use: Never used  ?Substance and Sexual Activity  ? Alcohol use: No  ? Drug use: No  ? Sexual activity: Not on file  ?Other Topics Concern  ? Not on file  ?Social History Narrative  ? Patient lives at home with her daughter.  ? Caffeine Use: 1 cup  daily  ? ?Social Determinants of Health  ? ?Financial Resource Strain: Not on file  ?Food Insecurity: Not on file  ?Transportation Needs: Not on file  ?Physical Activity: Not on file  ?Stress: Not on file  ?Social Connections: Not on file  ?Intimate Partner Violence: Not on file  ? ?Family History  ?Problem Relation Age of Onset  ? Hypertension Mother   ? Diabetes Mother   ? Hypertension Father   ? Heart disease Other   ? Hypertension Other   ?  Diabetes Other   ? Healthy Daughter   ? ?ROS: All systems reviewed and negative except as per HPI ? ?Current Outpatient Medications  ?Medication Sig Dispense Refill  ? Cholecalciferol (VITAMIN D) 125 MCG (5000 UT) CAPS Take 5,000 Units by mouth daily.    ? macitentan (OPSUMIT) 10 MG tablet Take 10 mg by mouth daily.    ? metFORMIN (GLUCOPHAGE) 1000 MG tablet TAKE 1 TABLET BY MOUTH TWICE DAILY WITH A MEAL 180 tablet 0  ? potassium chloride SA (KLOR-CON) 20 MEQ tablet Take 1 tablet (20 mEq total) by mouth daily. 90 tablet 3  ? tadalafil, PAH, (ADCIRCA) 20 MG tablet Take 1 tablet (20 mg total) by mouth daily. 30 tablet 3  ? torsemide (DEMADEX) 20 MG tablet Take 2 tablets (40 mg total) by mouth 2 (two) times daily. 180 tablet 3  ? ?No current facility-administered medications for this encounter.  ? ?Wt Readings from Last 3 Encounters:  ?08/21/21 90.1 kg (198 lb 9.6 oz)  ?07/30/21 93.4 kg (205 lb 12.8 oz)  ?07/30/21 93.7 kg (206 lb 9.6 oz)  ? ?BP 98/72   Pulse 89   Wt 90.1 kg (198 lb 9.6 oz)   SpO2 96%   BMI 38.79 kg/m?  ?Physical Exam: ?General:  NAD. No resp difficulty, arrived in [facility] on oxygen ?HEENT: Normal ?Neck: Supple. JVP 7-8. Carotids 2+ bilat; no bruits. No lymphadenopathy or thryomegaly appreciated. ?Cor: PMI nondisplaced. Regular rate & rhythm. No rubs, gallops or murmurs. ?Lungs: Clear ?Abdomen: Obese, soft, nontender, nondistended. No hepatosplenomegaly. No bruits or masses. Good bowel sounds. ?Extremities: No cyanosis, clubbing, rash, 1-2+ BLE pre-tibial edema ?Neuro: Alert & oriented x 3, cranial nerves grossly intact. Moves all 4 extremities w/o difficulty. Affect pleasant. ? ?Assessment/Plan: ?1. Pulmonary hypertension/RV failure: Echo in 11/22 showed EF 60-65%, mild LVH, severe RV enlargement with severely decreased RV systolic function, PASP 66 mmHg, severe RAE, moderate TR, moderate PR, small pericardial effusion. CTA chest showed no PE, normal lung fields and V/Q scan showed no evidence for  chronic PE.  PFTs showed severe restriction and decreased DLCO.  RHC/LHC showed severe pulmonary arterial HTN with RV failure.  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  No history of liver disease.  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  ANA, SCL-70 and RF negative with elevated CRP. She may have connective tissue disease-related PAH though she saw rheumatology and no definite diagnosis was given.  High resolution CT chest showed no evidence for ILD.  She remains volume overloaded today, however weight down 7 lbs. NYHA class III symptoms.  ?- Continue home oxygen, oxygen saturation 96% on 2L today. ?- Increase torsemide to 60 mg bid x 3 days, then back to torsemide 40 mg bid.  BMET/BNP today, repeat BMET in 10 days. This combination worked well for her previously. ?- Continue compression stockings.  ?- Continue Opsumit 10 mg  daily.  ?- Continue tadalafil 20 mg daily.  Would not increase until volume overload is better-controlled.  ?- Needs sleep study.  ?- Still need to check anti-centromere Ab to complete rheumatological workup.  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 She does not report myalgias or muscle pain.  Myositis panel was negative.  She saw rheumatology and no definite diagnosis was given.  ?3. Chronic Hypoxic Respiratory Failure: Now on home oxygen. 96% on 2L.  ? ?Keep follow up next month with Dr. [name] as scheduled. ? ?Rafael Bihari FNP ?08/21/2021 ? ?

## 2021-08-21 ENCOUNTER — Ambulatory Visit (HOSPITAL_COMMUNITY)
Admission: RE | Admit: 2021-08-21 | Discharge: 2021-08-21 | Disposition: A | Discharge status: Home / Self Care | Payer: 59 | Source: Ambulatory Visit | Attending: Family Medicine | Admitting: Family Medicine

## 2021-08-21 ENCOUNTER — Encounter (HOSPITAL_COMMUNITY): Payer: Self-pay

## 2021-08-21 VITALS — BP 98/72 | HR 89 | Wt 198.6 lb

## 2021-08-21 DIAGNOSIS — I129 Hypertensive chronic kidney disease with stage 1 through stage 4 chronic kidney disease, or unspecified chronic kidney disease: Secondary | ICD-10-CM | POA: Insufficient documentation

## 2021-08-21 DIAGNOSIS — E1122 Type 2 diabetes mellitus with diabetic chronic kidney disease: Secondary | ICD-10-CM | POA: Insufficient documentation

## 2021-08-21 DIAGNOSIS — R748 Abnormal levels of other serum enzymes: Secondary | ICD-10-CM | POA: Insufficient documentation

## 2021-08-21 DIAGNOSIS — I272 Pulmonary hypertension, unspecified: Secondary | ICD-10-CM | POA: Diagnosis not present

## 2021-08-21 DIAGNOSIS — Z79899 Other long term (current) drug therapy: Secondary | ICD-10-CM | POA: Insufficient documentation

## 2021-08-21 DIAGNOSIS — I3139 Other pericardial effusion (noninflammatory): Secondary | ICD-10-CM | POA: Diagnosis not present

## 2021-08-21 DIAGNOSIS — N189 Chronic kidney disease, unspecified: Secondary | ICD-10-CM | POA: Diagnosis not present

## 2021-08-21 DIAGNOSIS — I2721 Secondary pulmonary arterial hypertension: Secondary | ICD-10-CM | POA: Diagnosis present

## 2021-08-21 DIAGNOSIS — Z9981 Dependence on supplemental oxygen: Secondary | ICD-10-CM | POA: Insufficient documentation

## 2021-08-21 DIAGNOSIS — J9611 Chronic respiratory failure with hypoxia: Secondary | ICD-10-CM | POA: Insufficient documentation

## 2021-08-21 DIAGNOSIS — Z833 Family history of diabetes mellitus: Secondary | ICD-10-CM | POA: Insufficient documentation

## 2021-08-21 DIAGNOSIS — Z7984 Long term (current) use of oral hypoglycemic drugs: Secondary | ICD-10-CM | POA: Insufficient documentation

## 2021-08-21 DIAGNOSIS — Z8249 Family history of ischemic heart disease and other diseases of the circulatory system: Secondary | ICD-10-CM | POA: Insufficient documentation

## 2021-08-21 DIAGNOSIS — G7249 Other inflammatory and immune myopathies, not elsewhere classified: Secondary | ICD-10-CM | POA: Diagnosis not present

## 2021-08-21 LAB — BASIC METABOLIC PANEL
Anion gap: 10 (ref 5–15)
BUN: 33 mg/dL — ABNORMAL HIGH (ref 6–20)
CO2: 30 mmol/L (ref 22–32)
Calcium: 9.6 mg/dL (ref 8.9–10.3)
Chloride: 99 mmol/L (ref 98–111)
Creatinine, Ser: 1.68 mg/dL — ABNORMAL HIGH (ref 0.44–1.00)
GFR, Estimated: 36 mL/min — ABNORMAL LOW (ref >60)
Glucose, Bld: 108 mg/dL — ABNORMAL HIGH (ref 70–99)
Potassium: 3.3 mmol/L — ABNORMAL LOW (ref 3.5–5.1)
Sodium: 139 mmol/L (ref 135–145)

## 2021-08-21 LAB — BRAIN NATRIURETIC PEPTIDE: B Natriuretic Peptide: 960.8 pg/mL — ABNORMAL HIGH (ref 0.0–100.0)

## 2021-08-21 NOTE — Patient Instructions (Signed)
Medication Changes: ? ?Increase Torsemide to 60 mg (3 Tab) Twice daily for 3 days, then back to 40 mg Twice daily ? ? ? ?Lab Work: ? ?Labs done today, your results will be available in MyChart, we will contact you for abnormal readings. ? ? ?Testing/Procedures: ? ?none ? ?Referrals: ? ?none ? ?Special Instructions // Education: ? ?none ? ?Follow-Up in: as scheduled  ? ?At the [facility], you and your health needs are our priority. We have a designated team specialized in the treatment of Heart Failure. This Care Team includes your primary Heart Failure Specialized Cardiologist (physician), Advanced Practice Providers (APPs- Physician Assistants and Nurse Practitioners), and Pharmacist who all work together to provide you with the care you need, when you need it.  ? ?You may see any of the following providers on your designated Care Team at your next follow up: ? ?Dr Glori Bickers ?Dr Loralie Champagne ?Darrick Grinder, NP ?Lyda Jester, PA ?Jessica Milford,NP ?Marlyce Huge, PA ?Audry Riles, PharmD ? ? ?Please be sure to bring in all your medications bottles to every appointment.  ? ?Need to Contact us: ? ?If you have any questions or concerns before your next appointment please send [place] a message through [place] or call our office at [phone].   ? ?TO LEAVE A MESSAGE FOR THE NURSE SELECT OPTION 2, PLEASE LEAVE A MESSAGE INCLUDING: ?YOUR NAME ?DATE OF BIRTH ?CALL BACK NUMBER ?REASON FOR CALL**this is important as we prioritize the call backs ? ?YOU WILL RECEIVE A CALL BACK THE SAME DAY AS LONG AS YOU CALL BEFORE 4:00 PM ? ? ?

## 2021-08-24 ENCOUNTER — Telehealth (HOSPITAL_COMMUNITY): Payer: Self-pay | Admitting: Surgery

## 2021-08-24 NOTE — Telephone Encounter (Signed)
-----  Message from Rafael Bihari, [place] sent at 08/24/2021  7:48 AM EDT ----- ?K is low. Please increase KCL to 40 bid x 3 days, then 20 bid. Repeat BMET in 1 week.  ? ?Please call. ?

## 2021-08-24 NOTE — Telephone Encounter (Signed)
I attempted to reach patient about results and recommendations per provider.  I left a message and requested a call back. ?

## 2021-08-27 ENCOUNTER — Telehealth (HOSPITAL_COMMUNITY): Payer: Self-pay

## 2021-08-27 MED ORDER — POTASSIUM CHLORIDE CRYS ER 20 MEQ PO TBCR: 20.0000 meq | EXTENDED_RELEASE_TABLET | Freq: Two times a day (BID) | ORAL | 3 refills | Status: DC

## 2021-08-27 NOTE — Telephone Encounter (Signed)
-----  Message from Rafael Bihari, [place] sent at 08/24/2021  7:48 AM EDT ----- ?K is low. Please increase KCL to 40 bid x 3 days, then 20 bid. Repeat BMET in 1 week.  ? ?Please call. ?

## 2021-08-27 NOTE — Telephone Encounter (Signed)
Patient advised and verbalized understanding. Med list updated to reflect changes, patient has pending ov will recheck blood work at that time.  ? ?Meds ordered this encounter  ?Medications  ? potassium chloride SA (KLOR-CON M) 20 MEQ tablet  ?  Sig: Take 1 tablet (20 mEq total) by mouth 2 (two) times daily.  ?  Dispense:  180 tablet  ?  Refill:  3  ?  Please cancel all previous orders for current medication. Change in dosage or pill size.  ? ? ?

## 2021-09-10 ENCOUNTER — Encounter (HOSPITAL_COMMUNITY): Payer: Self-pay | Admitting: Cardiology

## 2021-09-10 ENCOUNTER — Ambulatory Visit (HOSPITAL_COMMUNITY)
Admission: RE | Admit: 2021-09-10 | Discharge: 2021-09-10 | Disposition: A | Discharge status: Home / Self Care | Payer: 59 | Source: Ambulatory Visit | Attending: Cardiology | Admitting: Cardiology

## 2021-09-10 VITALS — BP 120/78 | HR 87 | Wt 197.6 lb

## 2021-09-10 DIAGNOSIS — J9611 Chronic respiratory failure with hypoxia: Secondary | ICD-10-CM | POA: Diagnosis not present

## 2021-09-10 DIAGNOSIS — I3139 Other pericardial effusion (noninflammatory): Secondary | ICD-10-CM | POA: Diagnosis present

## 2021-09-10 DIAGNOSIS — I272 Pulmonary hypertension, unspecified: Secondary | ICD-10-CM

## 2021-09-10 DIAGNOSIS — I2721 Secondary pulmonary arterial hypertension: Secondary | ICD-10-CM | POA: Insufficient documentation

## 2021-09-10 DIAGNOSIS — G7249 Other inflammatory and immune myopathies, not elsewhere classified: Secondary | ICD-10-CM | POA: Diagnosis not present

## 2021-09-10 DIAGNOSIS — Z9981 Dependence on supplemental oxygen: Secondary | ICD-10-CM | POA: Insufficient documentation

## 2021-09-10 LAB — BASIC METABOLIC PANEL
Anion gap: 12 (ref 5–15)
BUN: 22 mg/dL — ABNORMAL HIGH (ref 6–20)
CO2: 32 mmol/L (ref 22–32)
Calcium: 9.2 mg/dL (ref 8.9–10.3)
Chloride: 95 mmol/L — ABNORMAL LOW (ref 98–111)
Creatinine, Ser: 1.78 mg/dL — ABNORMAL HIGH (ref 0.44–1.00)
GFR, Estimated: 34 mL/min — ABNORMAL LOW (ref >60)
Glucose, Bld: 114 mg/dL — ABNORMAL HIGH (ref 70–99)
Potassium: 3.2 mmol/L — ABNORMAL LOW (ref 3.5–5.1)
Sodium: 139 mmol/L (ref 135–145)

## 2021-09-10 LAB — BRAIN NATRIURETIC PEPTIDE: B Natriuretic Peptide: 916.7 pg/mL — ABNORMAL HIGH (ref 0.0–100.0)

## 2021-09-10 MED ORDER — TADALAFIL (PAH) 20 MG PO TABS: 40.0000 mg | ORAL_TABLET | Freq: Every day | ORAL | 3 refills | Status: AC

## 2021-09-10 MED ORDER — POTASSIUM CHLORIDE CRYS ER 20 MEQ PO TBCR: EXTENDED_RELEASE_TABLET | ORAL | 3 refills | Status: DC

## 2021-09-10 MED ORDER — TORSEMIDE 20 MG PO TABS: 60.0000 mg | ORAL_TABLET | Freq: Two times a day (BID) | ORAL | 3 refills | Status: DC

## 2021-09-10 NOTE — Patient Instructions (Signed)
Medication Changes:  Increase Torsemide to 39m Twice daily  Increase Potassium to 40 (2 Tab) in the morning and 20 (1 Tab) in the evening  Increase Tadalafil to 446mdaily  Lab Work:  Labs done today, your results will be available in MyChart, we will contact you for abnormal readings.   Testing/Procedures:  Your provider has recommended that you have a home sleep study.  We have provided you with the equipment in our office today. Please download the app and follow the instructions. YOUR PIN NUMBER IS: [id number]. Once you have completed the test you just dispose of the equipment, the information is automatically uploaded to us[place] blue-tooth technology. If your test is positive for sleep apnea and you need a home CPAP machine you will be contacted by Dr TuTheodosia Blenderffice ([facility] up.   Referrals:  none  Special Instructions // Education:  Please carry out your sleep study test.  Follow-Up in: 3 weeks   [facility] and your [facility] needs are our priority. We have a designated team specialized in the treatment of Heart Failure. This Care Team includes your primary Heart Failure Specialized Cardiologist (physician), Advanced Practice Providers (APPs- Physician Assistants and Nurse Practitioners), and Pharmacist who all work together to provide you with the care you need, when you need it.   You may see any of the following providers on your designated Care Team at your next follow up:  Dr DaGlori Bickersr DaHaynes KernsNP BrLyda JesterPAUtaheSummers County Arh HospitaliHighgate SpringsPAUtahaAudry RilesPharmD   Please be sure to bring in all your medications bottles to every appointment.   Need to Contact [place] If you have any questions or concerns before your next appointment please send [place] message through myPlainr call our office at [phone]   TO LEAVE A MESSAGE FOR THE NURSE SELECT OPTION 2, PLEASE LEAVE A MESSAGE  INCLUDING: YOUR NAME DATE OF BIRTH CALL BACK NUMBER REASON FOR CALL**this is important as we prioritize the call backs  YOU WILL RECEIVE A CALL BACK THE SAME DAY AS LONG AS YOU CALL BEFORE 4:00 PM

## 2021-09-10 NOTE — Progress Notes (Signed)
PCP: Minette Brine, FNP Cardiology: Dr. Radford Pax HF Cardiology: Dr. [name]  56 y.o. with history of HTN and diabetes as well as remote inflammatory myopathy was referred by Dr. Radford Pax for evaluation of pulmonary hypertension.  Patient was referred by PCP to Dr. Radford Pax in 8/22 for peripheral edema.  She has noted dyspnea if she walks fast for several months.      She has a chronic mild elevation in CKD and was treated "years ago" with prednisone for an inflammatory myopathy.   Echo 11/22 showed EF 60-65%, mild LVH, severe RV enlargement with severely decreased RV systolic function, PASP 66 mmHg, severe RAE, moderate TR, moderate PR, small pericardial effusion.  CTA chest showed no PE, no significant lung abnormalities.  V/Q scan showed no chronic PE. I did LHC/RHC in 1/23 that showed normal coronaries, elevated right heart filling pressures, severe pulmonary arterial hypertension, and normal PCWP.   She was seen by rheumatology for "inflammatory myopathy."  Serologic workup was unremarkable. She was not thought to have active rheumatologic disease.   Follow up 4/23, volume overloaded and hypoxic. She felt swelling increased after increasing tadalafil. Lasix switched to torsemide and arranged for home oxygen, oxygen saturation 82% on room air. Valsartan stopped after SCr elevated at 1.69.  Today she returns for HF followup.  Weight down 1 lb.  She is short of breath after walking about 50 yards, which she thinks is improved.  Mild orthopnea, no PND.  No lightheadedness or chest pain.  She continues to use home oxygen.   Labs (11/22): CK 252, CRP 26, ESR 12, RF negative, ANA negative, K 4.3, creatinine 0.9 Labs (1/23): SCL-70 negative, hgb 13.4, BNP 1288, K 41, creatinine 0.93 Labs (2/23): BNP 1193, anti-Jo-1 negative, K 4.7, creatinine 0.96, myositis panel negative, SPEP negative Labs (4/23): K 3.6, creatinine 1.38 => 1.68  6 minute walk (1/23): 152 m with oxygen saturation < 88% with ambulation 6  minute walk (2/23): 129 m, oxygen saturation 84% with ambulation  PMH: 1. Type 2 diabetes 2. 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  4. Pulmonary hypertension: Echo (11/22) with EF 60-65%, mild LVH, severe RV enlargement with severely decreased RV systolic function, PASP 66 mmHg, severe RAE, moderate TR, moderate PR, small pericardial effusion.  - CTA chest: No PE, lungs clear.  - V/Q scan: No evidence for chronic PE.  - PFTs (12/22): Severe restriction, decreased DLCO. - RHC/LHC (1/23): No coronary disease; mean RA 21, PA 70/35 mean 46, mean PCWP 4, CI 2.52, PVR 8.6 WU, PAPI 1.7 - High resolution CT chest (3/23): No ILD.   Social History   Socioeconomic History   Marital status: Single    Spouse name: Not on file   Number of children: 1   Years of education: College   Highest education level: Not on file  Occupational History    Comment: [employer]  Tobacco Use   Smoking status: Never    Passive exposure: Past   Smokeless tobacco: Never  Vaping Use   Vaping Use: Never used  Substance and Sexual Activity   Alcohol use: No   Drug use: No   Sexual activity: Not on file  Other Topics Concern   Not on file  Social History Narrative   Patient lives at home with her daughter.   Caffeine Use: 1 cup daily   Social Determinants of Health   Financial Resource Strain: Not on file  Food  Insecurity: Not on file  Transportation Needs: Not on file  Physical Activity: Not on file  Stress: Not on file  Social Connections: Not on file  Intimate Partner Violence: Not on file   Family History  Problem Relation Age of Onset   Hypertension Mother    Diabetes Mother    Hypertension Father    Heart disease Other    Hypertension Other    Diabetes Other    Healthy Daughter    ROS: All systems reviewed and negative except as per HPI  Current Outpatient Medications  Medication Sig Dispense Refill    Cholecalciferol (VITAMIN D) 125 MCG (5000 UT) CAPS Take 5,000 Units by mouth daily.     macitentan (OPSUMIT) 10 MG tablet Take 10 mg by mouth daily.     metFORMIN (GLUCOPHAGE) 1000 MG tablet TAKE 1 TABLET BY MOUTH TWICE DAILY WITH A MEAL 180 tablet 0   potassium chloride SA (KLOR-CON M) 20 MEQ tablet Take 2 tablets (40 mEq total) by mouth every morning AND 1 tablet (20 mEq total) every evening. 180 tablet 3   tadalafil, PAH, (ADCIRCA) 20 MG tablet Take 2 tablets (40 mg total) by mouth daily. 90 tablet 3   torsemide (DEMADEX) 20 MG tablet Take 3 tablets (60 mg total) by mouth 2 (two) times daily. 180 tablet 3   No current facility-administered medications for this encounter.   Wt Readings from Last 3 Encounters:  09/10/21 89.6 kg (197 lb 9.6 oz)  08/21/21 90.1 kg (198 lb 9.6 oz)  07/30/21 93.4 kg (205 lb 12.8 oz)   BP 120/78   Pulse 87   Wt 89.6 kg (197 lb 9.6 oz)   SpO2 94% Comment: 2l n/c  BMI 38.59 kg/m  General: NAD Neck: JVP 8-9 cm, no thyromegaly or thyroid nodule.  Lungs: Clear to auscultation bilaterally with normal respiratory effort. CV: Nondisplaced PMI.  Heart regular S1/S2, no S3/S4, no murmur.  1+ edema to knees.  No carotid bruit.  Normal pedal pulses.  Abdomen: Soft, nontender, no hepatosplenomegaly, no distention.  Skin: Intact without lesions or rashes.  Neurologic: Alert and oriented x 3.  Psych: Normal affect. Extremities: No clubbing or cyanosis.  HEENT: Normal.   Assessment/Plan: 1. Pulmonary hypertension/RV failure: Echo in 11/22 showed EF 60-65%, mild LVH, severe RV enlargement with severely decreased RV systolic function, PASP 66 mmHg, severe RAE, moderate TR, moderate PR, small pericardial effusion. CTA chest showed no PE, normal lung fields and V/Q scan showed no evidence for chronic PE.  PFTs showed severe restriction and decreased DLCO.  RHC/LHC showed severe pulmonary arterial HTN with RV failure.  I am concerned for group 1 pulmonary hypertension with RV  failure.  No history of liver disease.  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  ANA, SCL-70 and RF negative with elevated CRP. She may have connective tissue disease-related PAH though she saw rheumatology and no definite diagnosis was given.  High resolution CT chest showed no evidence for ILD.  Weight is slowly coming down but she is still volume overloaded. NYHA class III symptoms.  - Continue home oxygen. - Increase torsemide to 60 mg bid. BMET/BNP today, repeat BMET in 10 days.  Increase KCl to 40 qam/20 qpm.  - Continue compression stockings.  - Continue Opsumit 10 mg daily.  - Increase tadalafil to 40 mg daily.  - Arrange for sleep study.  - Still need to check anti-centromere Ab to  complete rheumatological workup.  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 She does not report myalgias or muscle pain.  Myositis panel was negative.  She saw rheumatology and no definite diagnosis was given.  3. Chronic Hypoxic Respiratory Failure: Now on home oxygen.   Followup with APP 3 wks.   Loralie Champagne 09/10/2021

## 2021-09-11 LAB — CENTROMERE ANTIBODIES: Centromere Ab Screen: <0.2 AI (ref 0.0–0.9)

## 2021-09-12 ENCOUNTER — Encounter (INDEPENDENT_AMBULATORY_CARE_PROVIDER_SITE_OTHER): Payer: 59 | Admitting: Cardiology

## 2021-09-12 DIAGNOSIS — G4733 Obstructive sleep apnea (adult) (pediatric): Secondary | ICD-10-CM | POA: Diagnosis not present

## 2021-09-14 ENCOUNTER — Ambulatory Visit: Payer: 59

## 2021-09-14 ENCOUNTER — Telehealth (HOSPITAL_COMMUNITY): Payer: Self-pay | Admitting: *Deleted

## 2021-09-14 DIAGNOSIS — I50810 Right heart failure, unspecified: Secondary | ICD-10-CM

## 2021-09-14 DIAGNOSIS — G4733 Obstructive sleep apnea (adult) (pediatric): Secondary | ICD-10-CM

## 2021-09-14 MED ORDER — POTASSIUM CHLORIDE CRYS ER 20 MEQ PO TBCR: EXTENDED_RELEASE_TABLET | ORAL | 3 refills | Status: DC

## 2021-09-14 NOTE — Telephone Encounter (Signed)
Spoke with pt regarding need to add additional K 20 meq. New regimen is now K 40 meq BID. Updated prescription sent to pharmacy. BMET scheduled for next week.   Emerson Monte RN [place] Coordinator  Office: [phone]  24/7 Pager: [phone]

## 2021-09-14 NOTE — Procedures (Signed)
     SLEEP STUDY REPORT Patient Information Study Date: 09/12/21 Patient Name: Shannon [name] Patient ID: [id number] Birth Date: 06-12-2065 Age: 56 Gender: Female Referring Physician:Dalton [name], MD  TEST DESCRIPTION: Home sleep apnea testing was completed using the WatchPat, a Type 1 device, utilizing peripheral arterial tonometry (PAT), chest movement, actigraphy, pulse oximetry, pulse rate, body position and snore. AHI was calculated with apnea and hypopnea using valid sleep time as the denominator. RDI includes apneas, hypopneas, and RERAs. The data acquired and the scoring of sleep and all associated events were performed in accordance with the recommended standards and specifications as outlined in the AASM Manual for the Scoring of Sleep and Associated Events 2.2.0 (2015).  FINDINGS: 1. Severe Obstructive Sleep Apnea with AHI /90.3hr. 2. Central Sleep Apnea could not be determined on this study. 3. Oxygen desaturations as low as 51%. 4. Severe snoring was present. O2 sats were < 88% for 1,010 min. 5. Total sleep time was 5 hrs and 13 min. 6. 22.1% of total sleep time was spent in REM sleep. 7. Normal sleep onset latency at 89mn. 8. Shortened REM sleep onset latency at 43 min. 9. Total awakenings were 3.  DIAGNOSIS: Severe Obstructive Sleep Apnea (G47.33)  RECOMMENDATIONS: 1. Clinical correlation of these findings is necessary. The decision to treat obstructive sleep apnea (OSA) is usually based on the presence of apnea symptoms or the presence of associated medical conditions such as Hypertension, Congestive Heart Failure, Atrial Fibrillation or Obesity. The most common symptoms of OSA are snoring, gasping for breath while sleeping, daytime sleepiness and fatigue. 2. Initiating apnea therapy is recommended given the presence of symptoms and/or associated conditions. Recommend proceeding with one of the following:   a. Auto-CPAP therapy with a pressure range of 5-20cm  H2O.   b. An oral appliance (OA) that can be obtained from certain dentists with expertise in sleep medicine. These are primarily of use in non-obese patients with mild and moderate disease.   c. An ENT consultation which may be useful to look for specific causes of obstruction and possible treatment options.   d. If patient is intolerant to PAP therapy, consider referral to ENT for evaluation for hypoglossal nerve stimulator.  3. Close follow-up is necessary to ensure success with CPAP or oral appliance therapy for maximum benefit .  4. A follow-up oximetry study on CPAP is recommended to assess the adequacy of therapy and determine the need for supplemental oxygen or the potential need for Bi-level therapy. An arterial blood gas to determine the adequacy of baseline ventilation and oxygenation should also be considered.  5. Healthy sleep recommendations include: adequate nightly sleep (normal 7-9 hrs/night), avoidance of caffeine after noon and alcohol near bedtime, and maintaining a sleep environment that is cool, dark and quiet.  6. Weight loss for overweight patients is recommended. Even modest amounts of weight loss can significantly improve the severity of sleep apnea.  7. Snoring recommendations include: weight loss where appropriate, side sleeping, and avoidance of alcohol before bed.  8. Operation of motor vehicle should be avoided when sleepy.  Signature: Electronically Signed: 09/14/21 TFransico Him MD; [facility] [place] American Board of Sleep Medicine

## 2021-09-16 ENCOUNTER — Other Ambulatory Visit: Payer: Self-pay

## 2021-09-16 DIAGNOSIS — G4733 Obstructive sleep apnea (adult) (pediatric): Secondary | ICD-10-CM

## 2021-09-16 DIAGNOSIS — I50810 Right heart failure, unspecified: Secondary | ICD-10-CM

## 2021-09-24 ENCOUNTER — Telehealth: Payer: Self-pay | Admitting: *Deleted

## 2021-09-24 ENCOUNTER — Ambulatory Visit (HOSPITAL_COMMUNITY)
Admission: RE | Admit: 2021-09-24 | Discharge: 2021-09-24 | Disposition: A | Discharge status: Home / Self Care | Payer: 59 | Source: Ambulatory Visit | Attending: Internal Medicine | Admitting: Internal Medicine

## 2021-09-24 DIAGNOSIS — I272 Pulmonary hypertension, unspecified: Secondary | ICD-10-CM | POA: Diagnosis present

## 2021-09-24 DIAGNOSIS — G4733 Obstructive sleep apnea (adult) (pediatric): Secondary | ICD-10-CM

## 2021-09-24 LAB — BASIC METABOLIC PANEL
Anion gap: 10 (ref 5–15)
BUN: 18 mg/dL (ref 6–20)
CO2: 33 mmol/L — ABNORMAL HIGH (ref 22–32)
Calcium: 9 mg/dL (ref 8.9–10.3)
Chloride: 96 mmol/L — ABNORMAL LOW (ref 98–111)
Creatinine, Ser: 1.65 mg/dL — ABNORMAL HIGH (ref 0.44–1.00)
GFR, Estimated: 37 mL/min — ABNORMAL LOW (ref >60)
Glucose, Bld: 93 mg/dL (ref 70–99)
Potassium: 3.5 mmol/L (ref 3.5–5.1)
Sodium: 139 mmol/L (ref 135–145)

## 2021-09-24 NOTE — Telephone Encounter (Signed)
The patient has been notified of the result and verbalized understanding.  All questions (if any) were answered. Marolyn Hammock, CMA 09/24/2021 1:75 PM    Will precert titration

## 2021-09-24 NOTE — Telephone Encounter (Signed)
-----  Message from Lauralee Evener, [place] sent at 09/17/2021  1:59 PM EDT -----  ----- Message ----- From: Sueanne Margarita, MD Sent: 09/14/2021   4:10 PM EDT To: [facility] Sleep Studies  Please let patient know that they have sleep apnea.  Recommend therapeutic CPAP titration ASAP due to severity of OSA for treatment of patient's sleep disordered breathing.  If unable to perform an in lab titration then initiate ResMed auto CPAP from 4 to 15cm H2O with heated humidity and mask of choice and overnight pulse ox on CPAP.

## 2021-10-01 ENCOUNTER — Encounter (HOSPITAL_COMMUNITY): Payer: 59

## 2021-10-15 NOTE — Addendum Note (Signed)
Addended by: Freada Bergeron on: 10/15/2021 05:13 PM   Modules accepted: Orders

## 2021-10-29 NOTE — Telephone Encounter (Signed)
Prior Authorization for TITRATION sent to [facility] via web portal. EXPECTED FROM DATE EXPECTED TO DATE  11/07/2021 12/08/2021

## 2021-11-05 ENCOUNTER — Encounter (HOSPITAL_COMMUNITY): Payer: Self-pay | Admitting: *Deleted

## 2021-11-10 NOTE — Progress Notes (Signed)
PCP: Minette Brine, FNP Cardiology: Dr. Radford Pax HF Cardiology: Dr. [name]  56 y.o. with history of HTN and diabetes as well as remote inflammatory myopathy was referred by Dr. Radford Pax for evaluation of pulmonary hypertension.  Patient was referred by PCP to Dr. Radford Pax in 8/22 for peripheral edema.  She has noted dyspnea if she walks fast for several months.      She has a chronic mild elevation in CKD and was treated "years ago" with prednisone for an inflammatory myopathy.   Echo 11/22 showed EF 60-65%, mild LVH, severe RV enlargement with severely decreased RV systolic function, PASP 66 mmHg, severe RAE, moderate TR, moderate PR, small pericardial effusion.  CTA chest showed no PE, no significant lung abnormalities.  V/Q scan showed no chronic PE. I did LHC/RHC in 1/23 that showed normal coronaries, elevated right heart filling pressures, severe pulmonary arterial hypertension, and normal PCWP.   She was seen by rheumatology for "inflammatory myopathy."  Serologic workup was unremarkable. She was not thought to have active rheumatologic disease.   Follow up 4/23, volume overloaded and hypoxic. She felt swelling increased after increasing tadalafil. Lasix switched to torsemide and arranged for home oxygen, oxygen saturation 82% on room air. Valsartan stopped after SCr elevated at 1.69.  Today she returns for HF followup.  Weight down 1 lb.  She is short of breath after walking about 50 yards, which she thinks is improved.  Mild orthopnea, no PND.  No lightheadedness or chest pain.  She continues to use home oxygen.   Labs (11/22): CK 252, CRP 26, ESR 12, RF negative, ANA negative, K 4.3, creatinine 0.9 Labs (1/23): SCL-70 negative, hgb 13.4, BNP 1288, K 41, creatinine 0.93 Labs (2/23): BNP 1193, anti-Jo-1 negative, K 4.7, creatinine 0.96, myositis panel negative, SPEP negative Labs (4/23): K 3.6, creatinine 1.38 => 1.68  6 minute walk (1/23): 152 m with oxygen saturation < 88% with ambulation 6  minute walk (2/23): 129 m, oxygen saturation 84% with ambulation  PMH: 1. Type 2 diabetes 2. 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  4. Pulmonary hypertension: Echo (11/22) with EF 60-65%, mild LVH, severe RV enlargement with severely decreased RV systolic function, PASP 66 mmHg, severe RAE, moderate TR, moderate PR, small pericardial effusion.  - CTA chest: No PE, lungs clear.  - V/Q scan: No evidence for chronic PE.  - PFTs (12/22): Severe restriction, decreased DLCO. - RHC/LHC (1/23): No coronary disease; mean RA 21, PA 70/35 mean 46, mean PCWP 4, CI 2.52, PVR 8.6 WU, PAPI 1.7 - High resolution CT chest (3/23): No ILD.   Social History   Socioeconomic History   Marital status: Single    Spouse name: Not on file   Number of children: 1   Years of education: College   Highest education level: Not on file  Occupational History    Comment: [employer]  Tobacco Use   Smoking status: Never    Passive exposure: Past   Smokeless tobacco: Never  Vaping Use   Vaping Use: Never used  Substance and Sexual Activity   Alcohol use: No   Drug use: No   Sexual activity: Not on file  Other Topics Concern   Not on file  Social History Narrative   Patient lives at home with her daughter.   Caffeine Use: 1 cup daily   Social Determinants of Health   Financial Resource Strain: Not on file  Food  Insecurity: Not on file  Transportation Needs: Not on file  Physical Activity: Not on file  Stress: Not on file  Social Connections: Not on file  Intimate Partner Violence: Not on file   Family History  Problem Relation Age of Onset   Hypertension Mother    Diabetes Mother    Hypertension Father    Heart disease Other    Hypertension Other    Diabetes Other    Healthy Daughter    ROS: All systems reviewed and negative except as per HPI  Current Outpatient Medications  Medication Sig Dispense Refill    Cholecalciferol (VITAMIN D) 125 MCG (5000 UT) CAPS Take 5,000 Units by mouth daily.     macitentan (OPSUMIT) 10 MG tablet Take 10 mg by mouth daily.     metFORMIN (GLUCOPHAGE) 1000 MG tablet TAKE 1 TABLET BY MOUTH TWICE DAILY WITH A MEAL 180 tablet 0   potassium chloride SA (KLOR-CON M) 20 MEQ tablet Take 2 tablets (40 mEq total) by mouth every morning AND 2 tablets (40 mEq total) every evening. 360 tablet 3   tadalafil, PAH, (ADCIRCA) 20 MG tablet Take 2 tablets (40 mg total) by mouth daily. 90 tablet 3   torsemide (DEMADEX) 20 MG tablet Take 3 tablets (60 mg total) by mouth 2 (two) times daily. 180 tablet 3   No current facility-administered medications for this visit.   Wt Readings from Last 3 Encounters:  09/10/21 89.6 kg (197 lb 9.6 oz)  08/21/21 90.1 kg (198 lb 9.6 oz)  07/30/21 93.4 kg (205 lb 12.8 oz)   There were no vitals taken for this visit. General: NAD Neck: JVP 8-9 cm, no thyromegaly or thyroid nodule.  Lungs: Clear to auscultation bilaterally with normal respiratory effort. CV: Nondisplaced PMI.  Heart regular S1/S2, no S3/S4, no murmur.  1+ edema to knees.  No carotid bruit.  Normal pedal pulses.  Abdomen: Soft, nontender, no hepatosplenomegaly, no distention.  Skin: Intact without lesions or rashes.  Neurologic: Alert and oriented x 3.  Psych: Normal affect. Extremities: No clubbing or cyanosis.  HEENT: Normal.   Assessment/Plan: 1. Pulmonary hypertension/RV failure: Echo in 11/22 showed EF 60-65%, mild LVH, severe RV enlargement with severely decreased RV systolic function, PASP 66 mmHg, severe RAE, moderate TR, moderate PR, small pericardial effusion. CTA chest showed no PE, normal lung fields and V/Q scan showed no evidence for chronic PE.  PFTs showed severe restriction and decreased DLCO.  RHC/LHC showed severe pulmonary arterial HTN with RV failure.  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  No history of liver disease.  She does have  history of "inflammatory myopathy" by muscle biopsy in 1/12 and has had chronic mild CK elevation.  ANA, SCL-70 and RF negative with elevated CRP. She may have connective tissue disease-related PAH though she saw rheumatology and no definite diagnosis was given.  High resolution CT chest showed no evidence for ILD.  Weight is slowly coming down but she is still volume overloaded. NYHA class III symptoms.  - Continue home oxygen. - Increase torsemide to 60 mg bid. BMET/BNP today, repeat BMET in 10 days.  Increase KCl to 40 qam/20 qpm.  - Continue compression stockings.  - Continue Opsumit 10 mg daily.  - Increase tadalafil to 40 mg daily.  - Arrange for sleep study.  - Still need to check anti-centromere Ab to complete rheumatological workup.  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  Muscle biopsy  in 1/12 showed "inflammatory myopathy."  She says that she was treated with prednisone in the past. She does not report myalgias or muscle pain.  Myositis panel was negative.  She saw rheumatology and no definite diagnosis was given.  3. Chronic Hypoxic Respiratory Failure: Now on home oxygen.   Followup with APP 3 wks.   [place] 11/10/2021

## 2021-11-11 ENCOUNTER — Encounter (HOSPITAL_COMMUNITY): Payer: Self-pay

## 2021-11-11 ENCOUNTER — Ambulatory Visit (HOSPITAL_COMMUNITY)
Admission: RE | Admit: 2021-11-11 | Discharge: 2021-11-11 | Disposition: A | Discharge status: Home / Self Care | Payer: 59 | Source: Ambulatory Visit | Attending: Family Medicine | Admitting: Family Medicine

## 2021-11-11 VITALS — BP 114/78 | HR 104 | Wt 199.8 lb

## 2021-11-11 DIAGNOSIS — Z9981 Dependence on supplemental oxygen: Secondary | ICD-10-CM | POA: Insufficient documentation

## 2021-11-11 DIAGNOSIS — J9611 Chronic respiratory failure with hypoxia: Secondary | ICD-10-CM | POA: Diagnosis not present

## 2021-11-11 DIAGNOSIS — E119 Type 2 diabetes mellitus without complications: Secondary | ICD-10-CM | POA: Diagnosis not present

## 2021-11-11 DIAGNOSIS — I272 Pulmonary hypertension, unspecified: Secondary | ICD-10-CM

## 2021-11-11 DIAGNOSIS — E8770 Fluid overload, unspecified: Secondary | ICD-10-CM | POA: Diagnosis not present

## 2021-11-11 DIAGNOSIS — I110 Hypertensive heart disease with heart failure: Secondary | ICD-10-CM | POA: Diagnosis not present

## 2021-11-11 DIAGNOSIS — Z79899 Other long term (current) drug therapy: Secondary | ICD-10-CM | POA: Insufficient documentation

## 2021-11-11 DIAGNOSIS — I50810 Right heart failure, unspecified: Secondary | ICD-10-CM

## 2021-11-11 DIAGNOSIS — G7249 Other inflammatory and immune myopathies, not elsewhere classified: Secondary | ICD-10-CM | POA: Diagnosis not present

## 2021-11-11 DIAGNOSIS — R5383 Other fatigue: Secondary | ICD-10-CM | POA: Diagnosis present

## 2021-11-11 DIAGNOSIS — G4733 Obstructive sleep apnea (adult) (pediatric): Secondary | ICD-10-CM

## 2021-11-11 LAB — BASIC METABOLIC PANEL
Anion gap: 12 (ref 5–15)
BUN: 37 mg/dL — ABNORMAL HIGH (ref 6–20)
CO2: 26 mmol/L (ref 22–32)
Calcium: 9.2 mg/dL (ref 8.9–10.3)
Chloride: 98 mmol/L (ref 98–111)
Creatinine, Ser: 2.26 mg/dL — ABNORMAL HIGH (ref 0.44–1.00)
GFR, Estimated: 25 mL/min — ABNORMAL LOW (ref >60)
Glucose, Bld: 81 mg/dL (ref 70–99)
Potassium: 4.7 mmol/L (ref 3.5–5.1)
Sodium: 136 mmol/L (ref 135–145)

## 2021-11-11 LAB — BRAIN NATRIURETIC PEPTIDE: B Natriuretic Peptide: 1063.1 pg/mL — ABNORMAL HIGH (ref 0.0–100.0)

## 2021-11-11 MED ORDER — TORSEMIDE 20 MG PO TABS: 80.0000 mg | ORAL_TABLET | Freq: Two times a day (BID) | ORAL | 6 refills | Status: AC

## 2021-11-11 MED ORDER — POTASSIUM CHLORIDE CRYS ER 20 MEQ PO TBCR: EXTENDED_RELEASE_TABLET | ORAL | 3 refills | Status: AC

## 2021-11-11 NOTE — Addendum Note (Signed)
Encounter addended by: Rafael Bihari, FNP on: 11/11/2021 4:33 PM  Actions taken: Letter saved

## 2021-11-11 NOTE — Patient Instructions (Addendum)
Thank you for coming in today  Labs were done today, if any labs are abnormal the clinic will call you No news is good news  INCREASE Potassium to 60 meq every morning and 40 meq every evening  INCREASE Torsemide to 80 mg twice a day  Call Dr. Landis Gandy office for CPAP [phone]  PLEASE Wear compression hose   Do the following things EVERYDAY: Weigh yourself in the morning before breakfast. Write it down and keep it in a log. Take your medicines as prescribed Eat low salt foods--Limit salt (sodium) to 2000 mg per day.  Stay as active as you can everyday Limit all fluids for the day to less than 2 liters At the [facility], you and your health needs are our priority. As part of our continuing mission to provide you with exceptional heart care, we have created designated Provider Care Teams. These Care Teams include your primary Cardiologist (physician) and Advanced Practice Providers (APPs- Physician Assistants and Nurse Practitioners) who all work together to provide you with the care you need, when you need it.   You may see any of the following providers on your designated Care Team at your next follow up: Dr Glori Bickers Dr Haynes Kerns, NP Lyda Jester, [place] [facility] [place], [place] Audry Riles, PharmD   Please be sure to bring in all your medications bottles to every appointment.   If you have any questions or concerns before your next appointment please send [place] a message through [place] or call our office at [phone].    TO LEAVE A MESSAGE FOR THE NURSE SELECT OPTION 2, PLEASE LEAVE A MESSAGE INCLUDING: YOUR NAME DATE OF BIRTH CALL BACK NUMBER REASON FOR CALL**this is important as we prioritize the call backs  YOU WILL RECEIVE A CALL BACK THE SAME DAY AS LONG AS YOU CALL BEFORE 4:00 PM

## 2021-11-13 ENCOUNTER — Telehealth: Payer: Self-pay | Admitting: Cardiology

## 2021-11-13 NOTE — Telephone Encounter (Signed)
Patient calling regarding her sleep study. Please advise

## 2021-11-20 ENCOUNTER — Encounter (HOSPITAL_COMMUNITY): Payer: Self-pay

## 2021-11-20 ENCOUNTER — Inpatient Hospital Stay (HOSPITAL_COMMUNITY): Payer: 59

## 2021-11-20 ENCOUNTER — Other Ambulatory Visit: Payer: Self-pay

## 2021-11-20 ENCOUNTER — Emergency Department (HOSPITAL_COMMUNITY): Payer: 59

## 2021-11-20 ENCOUNTER — Inpatient Hospital Stay (HOSPITAL_COMMUNITY)
Admission: EM | Admit: 2021-11-20 | Discharge: 2021-12-25 | DRG: 291 | Disposition: E | Payer: 59 | Attending: Cardiology | Admitting: Cardiology

## 2021-11-20 ENCOUNTER — Telehealth (HOSPITAL_COMMUNITY): Payer: Self-pay | Admitting: *Deleted

## 2021-11-20 DIAGNOSIS — D6489 Other specified anemias: Secondary | ICD-10-CM | POA: Diagnosis present

## 2021-11-20 DIAGNOSIS — J189 Pneumonia, unspecified organism: Secondary | ICD-10-CM | POA: Diagnosis not present

## 2021-11-20 DIAGNOSIS — J984 Other disorders of lung: Secondary | ICD-10-CM | POA: Diagnosis present

## 2021-11-20 DIAGNOSIS — E669 Obesity, unspecified: Secondary | ICD-10-CM | POA: Diagnosis present

## 2021-11-20 DIAGNOSIS — Z833 Family history of diabetes mellitus: Secondary | ICD-10-CM

## 2021-11-20 DIAGNOSIS — N179 Acute kidney failure, unspecified: Secondary | ICD-10-CM | POA: Diagnosis not present

## 2021-11-20 DIAGNOSIS — J9601 Acute respiratory failure with hypoxia: Secondary | ICD-10-CM | POA: Diagnosis not present

## 2021-11-20 DIAGNOSIS — Z515 Encounter for palliative care: Secondary | ICD-10-CM

## 2021-11-20 DIAGNOSIS — Z9981 Dependence on supplemental oxygen: Secondary | ICD-10-CM

## 2021-11-20 DIAGNOSIS — E871 Hypo-osmolality and hyponatremia: Secondary | ICD-10-CM | POA: Diagnosis present

## 2021-11-20 DIAGNOSIS — J9811 Atelectasis: Secondary | ICD-10-CM | POA: Diagnosis present

## 2021-11-20 DIAGNOSIS — J9621 Acute and chronic respiratory failure with hypoxia: Secondary | ICD-10-CM | POA: Diagnosis present

## 2021-11-20 DIAGNOSIS — I2721 Secondary pulmonary arterial hypertension: Secondary | ICD-10-CM | POA: Diagnosis present

## 2021-11-20 DIAGNOSIS — R748 Abnormal levels of other serum enzymes: Secondary | ICD-10-CM | POA: Diagnosis present

## 2021-11-20 DIAGNOSIS — I272 Pulmonary hypertension, unspecified: Secondary | ICD-10-CM | POA: Diagnosis not present

## 2021-11-20 DIAGNOSIS — I50813 Acute on chronic right heart failure: Secondary | ICD-10-CM | POA: Diagnosis present

## 2021-11-20 DIAGNOSIS — I493 Ventricular premature depolarization: Secondary | ICD-10-CM | POA: Diagnosis not present

## 2021-11-20 DIAGNOSIS — Z20822 Contact with and (suspected) exposure to covid-19: Secondary | ICD-10-CM | POA: Diagnosis present

## 2021-11-20 DIAGNOSIS — I071 Rheumatic tricuspid insufficiency: Secondary | ICD-10-CM | POA: Diagnosis present

## 2021-11-20 DIAGNOSIS — R188 Other ascites: Secondary | ICD-10-CM | POA: Diagnosis present

## 2021-11-20 DIAGNOSIS — R570 Cardiogenic shock: Secondary | ICD-10-CM | POA: Diagnosis not present

## 2021-11-20 DIAGNOSIS — I509 Heart failure, unspecified: Secondary | ICD-10-CM | POA: Diagnosis not present

## 2021-11-20 DIAGNOSIS — I4510 Unspecified right bundle-branch block: Secondary | ICD-10-CM | POA: Diagnosis present

## 2021-11-20 DIAGNOSIS — E1122 Type 2 diabetes mellitus with diabetic chronic kidney disease: Secondary | ICD-10-CM | POA: Diagnosis present

## 2021-11-20 DIAGNOSIS — I130 Hypertensive heart and chronic kidney disease with heart failure and stage 1 through stage 4 chronic kidney disease, or unspecified chronic kidney disease: Secondary | ICD-10-CM | POA: Diagnosis present

## 2021-11-20 DIAGNOSIS — R112 Nausea with vomiting, unspecified: Secondary | ICD-10-CM | POA: Diagnosis not present

## 2021-11-20 DIAGNOSIS — Z79899 Other long term (current) drug therapy: Secondary | ICD-10-CM

## 2021-11-20 DIAGNOSIS — J9622 Acute and chronic respiratory failure with hypercapnia: Secondary | ICD-10-CM | POA: Diagnosis present

## 2021-11-20 DIAGNOSIS — N170 Acute kidney failure with tubular necrosis: Secondary | ICD-10-CM | POA: Diagnosis present

## 2021-11-20 DIAGNOSIS — Z6841 Body Mass Index (BMI) 40.0 and over, adult: Secondary | ICD-10-CM | POA: Diagnosis not present

## 2021-11-20 DIAGNOSIS — E8729 Other acidosis: Secondary | ICD-10-CM | POA: Diagnosis present

## 2021-11-20 DIAGNOSIS — G7249 Other inflammatory and immune myopathies, not elsewhere classified: Secondary | ICD-10-CM | POA: Diagnosis present

## 2021-11-20 DIAGNOSIS — E875 Hyperkalemia: Secondary | ICD-10-CM | POA: Diagnosis present

## 2021-11-20 DIAGNOSIS — K5900 Constipation, unspecified: Secondary | ICD-10-CM | POA: Diagnosis not present

## 2021-11-20 DIAGNOSIS — D696 Thrombocytopenia, unspecified: Secondary | ICD-10-CM | POA: Diagnosis present

## 2021-11-20 DIAGNOSIS — N1832 Chronic kidney disease, stage 3b: Secondary | ICD-10-CM | POA: Diagnosis present

## 2021-11-20 DIAGNOSIS — I3139 Other pericardial effusion (noninflammatory): Secondary | ICD-10-CM | POA: Diagnosis present

## 2021-11-20 DIAGNOSIS — R609 Edema, unspecified: Secondary | ICD-10-CM | POA: Diagnosis not present

## 2021-11-20 DIAGNOSIS — R34 Anuria and oliguria: Secondary | ICD-10-CM | POA: Diagnosis present

## 2021-11-20 DIAGNOSIS — R140 Abdominal distension (gaseous): Secondary | ICD-10-CM | POA: Diagnosis not present

## 2021-11-20 DIAGNOSIS — G9341 Metabolic encephalopathy: Secondary | ICD-10-CM | POA: Diagnosis present

## 2021-11-20 DIAGNOSIS — J690 Pneumonitis due to inhalation of food and vomit: Secondary | ICD-10-CM | POA: Diagnosis not present

## 2021-11-20 DIAGNOSIS — Z66 Do not resuscitate: Secondary | ICD-10-CM | POA: Diagnosis not present

## 2021-11-20 DIAGNOSIS — I50811 Acute right heart failure: Secondary | ICD-10-CM | POA: Diagnosis not present

## 2021-11-20 DIAGNOSIS — R262 Difficulty in walking, not elsewhere classified: Secondary | ICD-10-CM | POA: Diagnosis present

## 2021-11-20 DIAGNOSIS — I50810 Right heart failure, unspecified: Secondary | ICD-10-CM | POA: Diagnosis not present

## 2021-11-20 DIAGNOSIS — G4733 Obstructive sleep apnea (adult) (pediatric): Secondary | ICD-10-CM | POA: Diagnosis present

## 2021-11-20 DIAGNOSIS — I160 Hypertensive urgency: Secondary | ICD-10-CM | POA: Diagnosis not present

## 2021-11-20 DIAGNOSIS — R319 Hematuria, unspecified: Secondary | ICD-10-CM | POA: Diagnosis not present

## 2021-11-20 DIAGNOSIS — R4182 Altered mental status, unspecified: Secondary | ICD-10-CM | POA: Diagnosis not present

## 2021-11-20 DIAGNOSIS — Z8249 Family history of ischemic heart disease and other diseases of the circulatory system: Secondary | ICD-10-CM

## 2021-11-20 DIAGNOSIS — L8990 Pressure ulcer of unspecified site, unspecified stage: Secondary | ICD-10-CM | POA: Insufficient documentation

## 2021-11-20 DIAGNOSIS — Z7189 Other specified counseling: Secondary | ICD-10-CM | POA: Diagnosis not present

## 2021-11-20 DIAGNOSIS — N3592 Unspecified urethral stricture, female: Secondary | ICD-10-CM | POA: Diagnosis present

## 2021-11-20 HISTORY — DX: Heart failure, unspecified: I50.9

## 2021-11-20 HISTORY — DX: Pulmonary hypertension, unspecified: I27.20

## 2021-11-20 LAB — CBC
HCT: 34.1 % — ABNORMAL LOW (ref 36.0–46.0)
Hemoglobin: 11.8 g/dL — ABNORMAL LOW (ref 12.0–15.0)
MCH: 28.5 pg (ref 26.0–34.0)
MCHC: 34.6 g/dL (ref 30.0–36.0)
MCV: 82.4 fL (ref 80.0–100.0)
Platelets: 249 10*3/uL (ref 150–400)
RBC: 4.14 MIL/uL (ref 3.87–5.11)
RDW: 18.6 % — ABNORMAL HIGH (ref 11.5–15.5)
WBC: 7.8 10*3/uL (ref 4.0–10.5)
nRBC: 1.4 % — ABNORMAL HIGH (ref 0.0–0.2)

## 2021-11-20 LAB — COMPREHENSIVE METABOLIC PANEL
ALT: 17 U/L (ref 0–44)
ALT: 16 U/L (ref 0–44)
AST: 22 U/L (ref 15–41)
AST: 21 U/L (ref 15–41)
Albumin: 3.6 g/dL (ref 3.5–5.0)
Albumin: 3.7 g/dL (ref 3.5–5.0)
Alkaline Phosphatase: 128 U/L — ABNORMAL HIGH (ref 38–126)
Alkaline Phosphatase: 135 U/L — ABNORMAL HIGH (ref 38–126)
Anion gap: 12 (ref 5–15)
Anion gap: 12 (ref 5–15)
BUN: 51 mg/dL — ABNORMAL HIGH (ref 6–20)
BUN: 52 mg/dL — ABNORMAL HIGH (ref 6–20)
CO2: 23 mmol/L (ref 22–32)
CO2: 21 mmol/L — ABNORMAL LOW (ref 22–32)
Calcium: 9.6 mg/dL (ref 8.9–10.3)
Calcium: 9.7 mg/dL (ref 8.9–10.3)
Chloride: 97 mmol/L — ABNORMAL LOW (ref 98–111)
Chloride: 101 mmol/L (ref 98–111)
Creatinine, Ser: 3.3 mg/dL — ABNORMAL HIGH (ref 0.44–1.00)
Creatinine, Ser: 3.26 mg/dL — ABNORMAL HIGH (ref 0.44–1.00)
GFR, Estimated: 16 mL/min — ABNORMAL LOW (ref >60)
GFR, Estimated: 16 mL/min — ABNORMAL LOW (ref >60)
Glucose, Bld: 105 mg/dL — ABNORMAL HIGH (ref 70–99)
Glucose, Bld: 106 mg/dL — ABNORMAL HIGH (ref 70–99)
Potassium: >7.5 mmol/L (ref 3.5–5.1)
Potassium: >7.5 mmol/L (ref 3.5–5.1)
Sodium: 132 mmol/L — ABNORMAL LOW (ref 135–145)
Sodium: 134 mmol/L — ABNORMAL LOW (ref 135–145)
Total Bilirubin: 3.9 mg/dL — ABNORMAL HIGH (ref 0.3–1.2)
Total Bilirubin: 3.7 mg/dL — ABNORMAL HIGH (ref 0.3–1.2)
Total Protein: 8.2 g/dL — ABNORMAL HIGH (ref 6.5–8.1)
Total Protein: 8.5 g/dL — ABNORMAL HIGH (ref 6.5–8.1)

## 2021-11-20 LAB — CBG MONITORING, ED
Glucose-Capillary: 109 mg/dL — ABNORMAL HIGH (ref 70–99)
Glucose-Capillary: 64 mg/dL — ABNORMAL LOW (ref 70–99)
Glucose-Capillary: 90 mg/dL (ref 70–99)
Glucose-Capillary: 95 mg/dL (ref 70–99)

## 2021-11-20 LAB — RESP PANEL BY RT-PCR (FLU A&B, COVID) ARPGX2
Influenza A by PCR: NEGATIVE
Influenza B by PCR: NEGATIVE
SARS Coronavirus 2 by RT PCR: NEGATIVE

## 2021-11-20 LAB — I-STAT ARTERIAL BLOOD GAS, ED
Acid-base deficit: 1 mmol/L (ref 0.0–2.0)
Acid-base deficit: 3 mmol/L — ABNORMAL HIGH (ref 0.0–2.0)
Bicarbonate: 25.1 mmol/L (ref 20.0–28.0)
Bicarbonate: 25.6 mmol/L (ref 20.0–28.0)
Calcium, Ion: 1.2 mmol/L (ref 1.15–1.40)
Calcium, Ion: 1.29 mmol/L (ref 1.15–1.40)
HCT: 40 % (ref 36.0–46.0)
HCT: 40 % (ref 36.0–46.0)
Hemoglobin: 13.6 g/dL (ref 12.0–15.0)
Hemoglobin: 13.6 g/dL (ref 12.0–15.0)
O2 Saturation: 91 %
O2 Saturation: 91 %
Patient temperature: 97.8
Patient temperature: 98.6
Potassium: 7.6 mmol/L (ref 3.5–5.1)
Potassium: 7.4 mmol/L (ref 3.5–5.1)
Sodium: 132 mmol/L — ABNORMAL LOW (ref 135–145)
Sodium: 133 mmol/L — ABNORMAL LOW (ref 135–145)
TCO2: 27 mmol/L (ref 22–32)
TCO2: 27 mmol/L (ref 22–32)
pCO2 arterial: 45.8 mmHg (ref 32–48)
pCO2 arterial: 58.2 mmHg — ABNORMAL HIGH (ref 32–48)
pH, Arterial: 7.346 — ABNORMAL LOW (ref 7.35–7.45)
pH, Arterial: 7.251 — ABNORMAL LOW (ref 7.35–7.45)
pO2, Arterial: 63 mmHg — ABNORMAL LOW (ref 83–108)
pO2, Arterial: 71 mmHg — ABNORMAL LOW (ref 83–108)

## 2021-11-20 LAB — CBC WITH DIFFERENTIAL/PLATELET
Abs Immature Granulocytes: 0.04 10*3/uL (ref 0.00–0.07)
Basophils Absolute: 0 10*3/uL (ref 0.0–0.1)
Basophils Relative: 1 %
Eosinophils Absolute: 0.1 10*3/uL (ref 0.0–0.5)
Eosinophils Relative: 1 %
HCT: 35.1 % — ABNORMAL LOW (ref 36.0–46.0)
Hemoglobin: 11.9 g/dL — ABNORMAL LOW (ref 12.0–15.0)
Immature Granulocytes: 1 %
Lymphocytes Relative: 11 %
Lymphs Abs: 0.8 10*3/uL (ref 0.7–4.0)
MCH: 27.8 pg (ref 26.0–34.0)
MCHC: 33.9 g/dL (ref 30.0–36.0)
MCV: 82 fL (ref 80.0–100.0)
Monocytes Absolute: 1.2 10*3/uL — ABNORMAL HIGH (ref 0.1–1.0)
Monocytes Relative: 16 %
Neutro Abs: 5 10*3/uL (ref 1.7–7.7)
Neutrophils Relative %: 70 %
Platelets: 317 10*3/uL (ref 150–400)
RBC: 4.28 MIL/uL (ref 3.87–5.11)
RDW: 18.8 % — ABNORMAL HIGH (ref 11.5–15.5)
WBC: 7.2 10*3/uL (ref 4.0–10.5)
nRBC: 1.1 % — ABNORMAL HIGH (ref 0.0–0.2)

## 2021-11-20 LAB — BRAIN NATRIURETIC PEPTIDE: B Natriuretic Peptide: 1014 pg/mL — ABNORMAL HIGH (ref 0.0–100.0)

## 2021-11-20 LAB — AMMONIA: Ammonia: 43 umol/L — ABNORMAL HIGH (ref 9–35)

## 2021-11-20 LAB — TROPONIN I (HIGH SENSITIVITY)
Troponin I (High Sensitivity): 9 ng/L (ref <18)
Troponin I (High Sensitivity): 10 ng/L (ref <18)

## 2021-11-20 MED ORDER — INSULIN ASPART 100 UNIT/ML IV SOLN
5.0000 [IU] | Freq: Once | INTRAVENOUS | Status: AC
  Administered 2021-11-20: 5 [IU] via INTRAVENOUS

## 2021-11-20 MED ORDER — SODIUM CHLORIDE 0.9 % IV SOLN
500.0000 mg | Freq: Once | INTRAVENOUS | Status: AC
  Administered 2021-11-21: 500 mg via INTRAVENOUS
  Filled 2021-11-20: qty 5

## 2021-11-20 MED ORDER — SODIUM CHLORIDE 0.9 % IV SOLN
1.0000 g | Freq: Once | INTRAVENOUS | Status: AC
  Administered 2021-11-21: 1 g via INTRAVENOUS
  Filled 2021-11-20: qty 10

## 2021-11-20 MED ORDER — CALCIUM GLUCONATE-NACL 1-0.675 GM/50ML-% IV SOLN
1.0000 g | Freq: Once | INTRAVENOUS | Status: AC
  Administered 2021-11-21: 1000 mg via INTRAVENOUS
  Filled 2021-11-20: qty 50

## 2021-11-20 MED ORDER — DEXTROSE 50 % IV SOLN
1.0000 | Freq: Once | INTRAVENOUS | Status: AC
  Administered 2021-11-20: 50 mL via INTRAVENOUS
  Filled 2021-11-20: qty 50

## 2021-11-20 MED ORDER — DOBUTAMINE IN D5W 4-5 MG/ML-% IV SOLN
2.5000 ug/kg/min | INTRAVENOUS | Status: DC
  Administered 2021-11-21 – 2021-11-23 (×2): 2.5 ug/kg/min via INTRAVENOUS
  Filled 2021-11-20 (×3): qty 250

## 2021-11-20 MED ORDER — CALCIUM GLUCONATE 10 % IV SOLN
1.0000 g | Freq: Once | INTRAVENOUS | Status: AC
  Administered 2021-11-20: 1 g via INTRAVENOUS
  Filled 2021-11-20: qty 10

## 2021-11-20 MED ORDER — POLYETHYLENE GLYCOL 3350 17 G PO PACK
17.0000 g | PACK | Freq: Every day | ORAL | Status: DC | PRN
Start: 2021-11-20 — End: 2021-12-01

## 2021-11-20 MED ORDER — INSULIN ASPART 100 UNIT/ML IV SOLN
5.0000 [IU] | Freq: Once | INTRAVENOUS | Status: AC
  Administered 2021-11-21: 5 [IU] via INTRAVENOUS

## 2021-11-20 MED ORDER — CALCIUM GLUCONATE 10 % IV SOLN
1.0000 g | Freq: Once | INTRAVENOUS | Status: DC
Start: 2021-11-20 — End: 2021-11-20

## 2021-11-20 MED ORDER — SODIUM BICARBONATE 8.4 % IV SOLN
50.0000 meq | Freq: Once | INTRAVENOUS | Status: AC
  Administered 2021-11-21: 50 meq via INTRAVENOUS
  Filled 2021-11-20: qty 50

## 2021-11-20 MED ORDER — LACTULOSE ENEMA
300.0000 mL | Freq: Once | ORAL | Status: AC
  Administered 2021-11-21: 300 mL via RECTAL
  Filled 2021-11-20: qty 300

## 2021-11-20 MED ORDER — DOCUSATE SODIUM 100 MG PO CAPS
100.0000 mg | ORAL_CAPSULE | Freq: Two times a day (BID) | ORAL | Status: DC | PRN
Start: 2021-11-20 — End: 2021-11-25

## 2021-11-20 MED ORDER — HEPARIN SODIUM (PORCINE) 5000 UNIT/ML IJ SOLN
5000.0000 [IU] | Freq: Three times a day (TID) | INTRAMUSCULAR | Status: DC
Start: 2021-11-20 — End: 2021-11-30
  Administered 2021-11-21 – 2021-11-30 (×29): 5000 [IU] via SUBCUTANEOUS
  Filled 2021-11-20 (×27): qty 1

## 2021-11-20 MED ORDER — FUROSEMIDE 10 MG/ML IJ SOLN
80.0000 mg | Freq: Once | INTRAMUSCULAR | Status: AC
  Administered 2021-11-20: 80 mg via INTRAVENOUS
  Filled 2021-11-20: qty 8

## 2021-11-20 MED ORDER — INSULIN ASPART 100 UNIT/ML IJ SOLN
0.0000 [IU] | INTRAMUSCULAR | Status: DC
  Administered 2021-11-21 – 2021-11-23 (×4): 1 [IU] via SUBCUTANEOUS
  Administered 2021-11-24: 3 [IU] via SUBCUTANEOUS
  Administered 2021-11-27 – 2021-11-30 (×7): 1 [IU] via SUBCUTANEOUS

## 2021-11-20 MED ORDER — DEXTROSE 50 % IV SOLN
1.0000 | Freq: Once | INTRAVENOUS | Status: AC
Start: 2021-11-20 — End: 2021-11-21
  Administered 2021-11-21: 50 mL via INTRAVENOUS
  Filled 2021-11-20: qty 50

## 2021-11-20 MED ORDER — ALBUTEROL SULFATE (2.5 MG/3ML) 0.083% IN NEBU
10.0000 mg | INHALATION_SOLUTION | Freq: Once | RESPIRATORY_TRACT | Status: AC
Start: 2021-11-20 — End: 2021-11-20
  Administered 2021-11-20: 10 mg via RESPIRATORY_TRACT
  Filled 2021-11-20: qty 12

## 2021-11-20 MED ORDER — SODIUM CHLORIDE 0.9 % IV SOLN: INTRAVENOUS | Status: DC | PRN

## 2021-11-20 NOTE — ED Provider Notes (Signed)
[facility] EMERGENCY DEPARTMENT Provider Note   CSN: [id number] Arrival date & time: 11/23/2021  1434     History  Chief Complaint  Patient presents with   Respiratory Distress    Shannon [name] is a 56 y.o. female.  HPI 56 year old female presents with shortness of breath.  History is obtained from patient but also from family member at the bedside.  She has been weak and having trouble walking for months.  However, now she was confused this morning and the family member indicates that she was talking out of her head.  This seems to have acutely resolved.  There was concern from EMS she might have a UTI but is unclear why.  Patient has chronic cough and chronic dyspnea from what sounds like pulmonary artery hypertension.  She follows with Dr. [name].  She has been having some lower extremity edema though may be a little improved today.  No chest pain or change in the cough but she is more short of breath today.  She wears her oxygen nearly every day, 2 L.  EMS found her to be hypoxic though is unclear if she had her oxygen on at the time.  Home Medications Prior to Admission medications   Medication Sig Start Date End Date Taking? Authorizing Provider  Cholecalciferol (VITAMIN D) 125 MCG (5000 UT) CAPS Take 5,000 Units by mouth daily.    [provider]  macitentan (OPSUMIT) 10 MG tablet Take 10 mg by mouth daily.    [provider]  metFORMIN (GLUCOPHAGE) 1000 MG tablet TAKE 1 TABLET BY MOUTH TWICE DAILY WITH A MEAL 05/11/21   Minette Brine, FNP  potassium chloride SA (KLOR-CON M) 20 MEQ tablet Take 3 tablets (60 mEq total) by mouth every morning AND 2 tablets (40 mEq total) every evening. 11/11/21   [place], Maricela Bo, FNP  tadalafil, PAH, (ADCIRCA) 20 MG tablet Take 2 tablets (40 mg total) by mouth daily. 09/10/21   Larey Dresser, MD  torsemide (DEMADEX) 20 MG tablet Take 4 tablets (80 mg total) by mouth 2 (two) times daily. 11/11/21   Rafael Bihari, FNP      Allergies    Patient has no known allergies.    Review of Systems   Review of Systems  Constitutional:  Positive for fatigue. Negative for fever.  Respiratory:  Positive for cough and shortness of breath.   Cardiovascular:  Positive for leg swelling. Negative for chest pain.  Gastrointestinal:  Negative for abdominal pain.  Genitourinary:  Negative for dysuria.  Neurological:  Positive for weakness.    Physical Exam Updated Vital Signs BP 95/71   Pulse 78   Temp 97.6 F (36.4 C) (Axillary)   Resp 20   Ht 5' (1.524 m)   Wt 89.4 kg   SpO2 100%   BMI 38.47 kg/m  Physical Exam Vitals and nursing note reviewed.  Constitutional:      Appearance: She is well-developed.  HENT:     Head: Normocephalic and atraumatic.  Eyes:     Extraocular Movements: Extraocular movements intact.     Pupils: Pupils are equal, round, and reactive to light.  Cardiovascular:     Rate and Rhythm: Normal rate and regular rhythm.     Heart sounds: Normal heart sounds.  Pulmonary:     Effort: Pulmonary effort is normal. Tachypnea present. No respiratory distress.     Breath sounds: Examination of the right-lower field reveals rales. Examination of the left-lower field reveals  rales. Rales present.  Abdominal:     Palpations: Abdomen is soft.     Tenderness: There is no abdominal tenderness.  Musculoskeletal:     Right lower leg: Edema present.     Left lower leg: Edema present.     Comments: Pitting edema in BLE up to knees  Skin:    General: Skin is warm and dry.  Neurological:     Mental Status: She is alert and oriented to person, place, and time.     Comments: Patient has diffuse generalized weakness in all 4 extremities.  The left proximal upper extremity is weaker than all of these, which is chronic per patient.  She is alert and oriented to person, place, time, situation     ED Results / Procedures / Treatments   Labs (all labs ordered are listed, but only  abnormal results are displayed) Labs Reviewed  CBC WITH DIFFERENTIAL/PLATELET - Abnormal; Notable for the following components:      Result Value   Hemoglobin 11.9 (*)    HCT 35.1 (*)    RDW 18.8 (*)    nRBC 1.1 (*)    Monocytes Absolute 1.2 (*)    All other components within normal limits  BRAIN NATRIURETIC PEPTIDE - Abnormal; Notable for the following components:   B Natriuretic Peptide 1,014.0 (*)    All other components within normal limits  COMPREHENSIVE METABOLIC PANEL - Abnormal; Notable for the following components:   Sodium 132 (*)    Potassium >7.5 (*)    Chloride 97 (*)    Glucose, Bld 105 (*)    BUN 51 (*)    Creatinine, Ser 3.30 (*)    Total Protein 8.2 (*)    Alkaline Phosphatase 128 (*)    Total Bilirubin 3.9 (*)    GFR, Estimated 16 (*)    All other components within normal limits  AMMONIA - Abnormal; Notable for the following components:   Ammonia 43 (*)    All other components within normal limits  COMPREHENSIVE METABOLIC PANEL - Abnormal; Notable for the following components:   Sodium 134 (*)    Potassium >7.5 (*)    CO2 21 (*)    Glucose, Bld 106 (*)    BUN 52 (*)    Creatinine, Ser 3.26 (*)    Total Protein 8.5 (*)    Alkaline Phosphatase 135 (*)    Total Bilirubin 3.7 (*)    GFR, Estimated 16 (*)    All other components within normal limits  I-STAT ARTERIAL BLOOD GAS, ED - Abnormal; Notable for the following components:   pH, Arterial 7.346 (*)    pO2, Arterial 63 (*)    Sodium 132 (*)    Potassium 7.6 (*)    All other components within normal limits  CBG MONITORING, ED - Abnormal; Notable for the following components:   Glucose-Capillary 64 (*)    All other components within normal limits  CBG MONITORING, ED - Abnormal; Notable for the following components:   Glucose-Capillary 109 (*)    All other components within normal limits  I-STAT ARTERIAL BLOOD GAS, ED - Abnormal; Notable for the following components:   pH, Arterial 7.251 (*)    pCO2  arterial 58.2 (*)    pO2, Arterial 71 (*)    Acid-base deficit 3.0 (*)    Sodium 133 (*)    Potassium 7.4 (*)    All other components within normal limits  RESP PANEL BY RT-PCR (FLU A&B, COVID) ARPGX2  CULTURE,  BLOOD (ROUTINE X 2)  CULTURE, BLOOD (ROUTINE X 2)  URINE CULTURE  MRSA NEXT GEN BY PCR, NASAL  URINALYSIS, ROUTINE W REFLEX MICROSCOPIC  RAPID URINE DRUG SCREEN, HOSP PERFORMED  BASIC METABOLIC PANEL  MAGNESIUM  HIV ANTIBODY (ROUTINE TESTING W REFLEX)  CBC  COMPREHENSIVE METABOLIC PANEL  PHOSPHORUS  LACTIC ACID, PLASMA  LACTIC ACID, PLASMA  PROCALCITONIN  CBC  BLOOD GAS, ARTERIAL  HEMOGLOBIN A1C  AMMONIA  RAPID URINE DRUG SCREEN, HOSP PERFORMED  ETHANOL  CBG MONITORING, ED  TROPONIN I (HIGH SENSITIVITY)  TROPONIN I (HIGH SENSITIVITY)    EKG EKG Interpretation  Date/Time:  Friday November 20 2021 20:19:58 EDT Ventricular Rate:  84 PR Interval:  189 QRS Duration: 148 QT Interval:  367 QTC Calculation: 434 R Axis:   -79 Text Interpretation: Sinus rhythm Right bundle branch block Anterolateral infarct, age indeterminate similar to earlier in the day Confirmed by Sherwood Gambler [phone]) on 11/05/2021 8:27:47 PM  Radiology CT CHEST WO CONTRAST  Result Date: 11/18/2021 CLINICAL DATA:  Respiratory distress. Concern for pulmonary embolism. EXAM: CT CHEST WITHOUT CONTRAST TECHNIQUE: Multidetector CT imaging of the chest was performed following the standard protocol without IV contrast. RADIATION DOSE REDUCTION: This exam was performed according to the departmental dose-optimization program which includes automated exposure control, adjustment of the mA and/or kV according to patient size and/or use of iterative reconstruction technique. COMPARISON:  Chest CT dated 07/06/2021 and radiograph dated 11/04/2021. FINDINGS: Evaluation of this exam is limited due to respiratory motion as well as in the absence of intravenous contrast. Cardiovascular: Borderline cardiomegaly. Trace  pericardial effusion. Mild atherosclerotic calcification of the aortic arch. No aneurysmal dilatation. Mild dilatation of the pulmonary trunk which may suggest pulmonary hypertension. Mediastinum/Nodes: No hilar or mediastinal adenopathy. The esophagus is grossly unremarkable. No mediastinal fluid collection. Lungs/Pleura: There is partial consolidation of the left lower lobe with air bronchogram which may represent atelectasis or infiltrate. Underlying mass is not excluded. Trace left pleural effusion. Minimal right lung base atelectasis. Overall there is shallow inspiration. No pneumothorax. The central airways are patent. Upper Abdomen: Partially visualized ascites. Musculoskeletal: Diffuse subcutaneous edema and anasarca. Osteopenia with degenerative changes of the spine. Probable old sternal fracture. No acute osseous pathology. IMPRESSION: 1. Low lung volumes with partial consolidation of the left lower lobe with air bronchogram which may represent atelectasis or infiltrate. 2. Trace left pleural effusion. 3. Partially visualized ascites and anasarca. 4. Aortic Atherosclerosis (ICD10-I70.0). Electronically Signed   By: Anner Crete M.D.   On: 11/06/2021 21:20   DG Chest Portable 1 View  Result Date: 10/29/2021 CLINICAL DATA:  Worsening dyspnea EXAM: PORTABLE CHEST 1 VIEW COMPARISON:  Film from earlier in the same day. FINDINGS: Cardiac shadow is prominent but accentuated by the portable technique. The overall inspiratory effort is again poor with left basilar atelectatic changes. Vascular crowding is noted centrally. No acute bony abnormality is seen. IMPRESSION: Persistent left basilar atelectasis. No new focal abnormality is seen. Electronically Signed   By: Inez Catalina M.D.   On: 11/21/2021 19:38   DG Chest Portable 1 View  Result Date: 11/01/2021 CLINICAL DATA:  Dyspnea. EXAM: PORTABLE CHEST 1 VIEW COMPARISON:  03/20/2021 FINDINGS: Heart size is within normal limits allowing for very low lung  volumes. Opacity at the left lung base may be due to atelectasis or infiltrate. Right lung appears clear. No evidence of pleural effusion. IMPRESSION: Very low lung volumes. Left basilar atelectasis versus infiltrate. Electronically Signed   By: Myles Rosenthal.D.  On: 11/06/2021 15:28    Procedures .Critical Care  Performed by: Sherwood Gambler, MD Authorized by: Sherwood Gambler, MD   Critical care provider statement:    Critical care time (minutes):  60   Critical care time was exclusive of:  Separately billable procedures and treating other patients   Critical care was necessary to treat or prevent imminent or life-threatening deterioration of the following conditions:  Respiratory failure and renal failure   Critical care was time spent personally by me on the following activities:  Development of treatment plan with patient or surrogate, discussions with consultants, evaluation of patient's response to treatment, examination of patient, ordering and review of laboratory studies, ordering and review of radiographic studies, ordering and performing treatments and interventions, pulse oximetry, re-evaluation of patient's condition and review of old charts     Medications Ordered in ED Medications  cefTRIAXone (ROCEPHIN) 1 g in sodium chloride 0.9 % 100 mL IVPB (has no administration in time range)  azithromycin (ZITHROMAX) 500 mg in sodium chloride 0.9 % 250 mL IVPB (has no administration in time range)  docusate sodium (COLACE) capsule 100 mg (has no administration in time range)  polyethylene glycol (MIRALAX / GLYCOLAX) packet 17 g (has no administration in time range)  heparin injection 5,000 Units (has no administration in time range)  insulin aspart (novoLOG) injection 0-6 Units (has no administration in time range)  lactulose (CHRONULAC) enema 200 gm (has no administration in time range)  insulin aspart (novoLOG) injection 5 Units (has no administration in time range)    And   dextrose 50 % solution 50 mL (has no administration in time range)  sodium bicarbonate injection 50 mEq (has no administration in time range)  calcium gluconate 1 g/ 50 mL sodium chloride IVPB (has no administration in time range)  DOBUTamine (DOBUTREX) infusion 4000 mcg/mL (has no administration in time range)  0.9 %  sodium chloride infusion (has no administration in time range)  furosemide (LASIX) injection 80 mg (80 mg Intravenous Given 11/16/2021 1825)  calcium gluconate inj 10% (1 g) URGENT USE ONLY! (1 g Intravenous Given 11/19/2021 2046)  albuterol (PROVENTIL) (2.5 MG/3ML) 0.083% nebulizer solution 10 mg (10 mg Nebulization Given 10/30/2021 2041)  insulin aspart (novoLOG) injection 5 Units (5 Units Intravenous Given 10/24/2021 2042)    And  dextrose 50 % solution 50 mL (50 mLs Intravenous Given 10/24/2021 2042)    ED Course/ Medical Decision Making/ A&P Clinical Course as of 10/30/2021 2335  Fri Nov 20, 2021  1535 I-STAT ABG shows no respiratory acidosis which was the primary concern.  Potassium is 7.6 but it is unclear if this was hemolyzed.  Given she is otherwise stable, will wait on the formal metabolic panel [SG]  4497 Multiple delays with lab.  They had to have a redraw for the CMP and then it took a couple hours to come back.  Now the potassium is greater than 7.5.  There is some slight hemolysis but with her worsening renal function, this could be real.  We will repeat EKG and treat and then send a repeat BMP. [SG]  2157 Discussed case with Dr. Moshe Cipro of nephrology.  Potassium is still quite elevated.  She probably needs CRRT.  Discussed with Dr. Lynetta Mare, he will come see and discussed with cards. [SG]    Clinical Course User Index [SG] Sherwood Gambler, MD  Medical Decision Making Amount and/or Complexity of Data Reviewed Labs: ordered. Radiology: ordered.  Risk OTC drugs. Prescription drug management. Decision regarding  hospitalization.   Initially, patient was not ill-appearing.  While I was in another critical patient's room directing CPR, the patient seemed to acutely worsen and had to be put on BiPAP.  After that she seemed more stable.  Unfortunately it turns out the potassium is actually elevated.  She was given calcium, insulin, glucose, albuterol as above.  Blood pressures are soft.  She clinically appears fluid overloaded though with her CT not showing significant edema I wonder if she is not quite as overloaded.  She is also been on p.o. potassium at home which is likely complicated by the renal failure causing the hyperkalemia.  Dr. Moshe Cipro has seen patient as well.  At this point, patient is becoming a little more somnolent on the BiPAP so repeat ABG was obtained.  Shows worsening respiratory acidosis so her IPAP was changed.  At this point I do not think she needs to be intubated as she does wake up with some stimulation but needs to be monitored closely.  She will need ICU care.  Discussed her critical illness with patient and family.        Final Clinical Impression(s) / ED Diagnoses Final diagnoses:  Acute respiratory failure with hypoxia ([place])  Acute kidney injury ([place])  Hyperkalemia    Rx / DC Orders ED Discharge Orders     None         Sherwood Gambler, MD 10/28/2021 2337

## 2021-11-20 NOTE — ED Notes (Signed)
Dr Lynetta Mare at bedside

## 2021-11-20 NOTE — ED Notes (Signed)
Phleb at bedside °
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
## 2021-11-20 NOTE — Telephone Encounter (Signed)
Pts daughter called stating pt was short of breath, falling asleep while talking, O2 81. Daughter said pt seems confused. Advised to take pt to the ER. Daughter agreed.

## 2021-11-20 NOTE — Progress Notes (Signed)
RT note- Patient placed on BIPAP for respiratory distress, increased work of breathing. Bipap is currently helping, and patient is resting, received lasix by RN, continue to monitor.

## 2021-11-20 NOTE — ED Notes (Addendum)
Pt family called out saying pt is having difficulty breathing. Pt was woken from her sleep with feelings of not being able to breath. Pt is markedly working harder to breath repeatedly saying help me. Pt L lower lung sounds very diminished, clear diminished upper. R lower lung sounds crackles w/ clear diminished upper. RT notified and BiPap brought to bedside. Plunkett MD at bedside. PT then placed on NRB by EDP while RT sets up BiPap. Prior to RT and MD arrival, this RN increased pt O2 to 6L [place] and instructed pt to breath in her nose and out her mouth. Pt sats came up to 88%.

## 2021-11-20 NOTE — ED Notes (Signed)
Providers at bedside.

## 2021-11-20 NOTE — ED Notes (Signed)
MD notified of bladder scan

## 2021-11-20 NOTE — ED Notes (Signed)
Pt taken to CT with this RN and RT

## 2021-11-20 NOTE — ED Notes (Addendum)
Phlebotomy asked to redraw labs

## 2021-11-20 NOTE — ED Triage Notes (Signed)
Pt arrived to ED via [facility] from home. EMS reports family called EMS d/t labored breathing and confusion that started around 0700. EMS reported rhonchi in the lower lungs and also that family reported a strong smell to her urine this morning. EMS reports difficulty obtaining O2 sat and were getting readings from 82-92%. EMS placed pt on 6L [place]. 20g L AC, BP 114/84, HR 60, CBG 98. Pt is A&Ox4 upon arrival to ED and reported to be the same w/ EMS.

## 2021-11-21 ENCOUNTER — Inpatient Hospital Stay (HOSPITAL_COMMUNITY): Payer: 59

## 2021-11-21 DIAGNOSIS — R609 Edema, unspecified: Secondary | ICD-10-CM

## 2021-11-21 DIAGNOSIS — J9621 Acute and chronic respiratory failure with hypoxia: Secondary | ICD-10-CM | POA: Diagnosis not present

## 2021-11-21 DIAGNOSIS — J9601 Acute respiratory failure with hypoxia: Secondary | ICD-10-CM | POA: Diagnosis not present

## 2021-11-21 DIAGNOSIS — N179 Acute kidney failure, unspecified: Secondary | ICD-10-CM

## 2021-11-21 DIAGNOSIS — I160 Hypertensive urgency: Secondary | ICD-10-CM

## 2021-11-21 DIAGNOSIS — J9622 Acute and chronic respiratory failure with hypercapnia: Secondary | ICD-10-CM

## 2021-11-21 DIAGNOSIS — I50810 Right heart failure, unspecified: Secondary | ICD-10-CM

## 2021-11-21 LAB — BASIC METABOLIC PANEL
Anion gap: 9 (ref 5–15)
BUN: 52 mg/dL — ABNORMAL HIGH (ref 6–20)
CO2: 26 mmol/L (ref 22–32)
Calcium: 9.4 mg/dL (ref 8.9–10.3)
Chloride: 101 mmol/L (ref 98–111)
Creatinine, Ser: 3.13 mg/dL — ABNORMAL HIGH (ref 0.44–1.00)
GFR, Estimated: 17 mL/min — ABNORMAL LOW (ref >60)
Glucose, Bld: 60 mg/dL — ABNORMAL LOW (ref 70–99)
Potassium: 5.5 mmol/L — ABNORMAL HIGH (ref 3.5–5.1)
Sodium: 136 mmol/L (ref 135–145)

## 2021-11-21 LAB — GLUCOSE, CAPILLARY
Glucose-Capillary: 186 mg/dL — ABNORMAL HIGH (ref 70–99)
Glucose-Capillary: 120 mg/dL — ABNORMAL HIGH (ref 70–99)
Glucose-Capillary: 236 mg/dL — ABNORMAL HIGH (ref 70–99)
Glucose-Capillary: 59 mg/dL — ABNORMAL LOW (ref 70–99)
Glucose-Capillary: 59 mg/dL — ABNORMAL LOW (ref 70–99)
Glucose-Capillary: 57 mg/dL — ABNORMAL LOW (ref 70–99)
Glucose-Capillary: 73 mg/dL (ref 70–99)
Glucose-Capillary: 61 mg/dL — ABNORMAL LOW (ref 70–99)
Glucose-Capillary: 67 mg/dL — ABNORMAL LOW (ref 70–99)
Glucose-Capillary: 92 mg/dL (ref 70–99)
Glucose-Capillary: 77 mg/dL (ref 70–99)
Glucose-Capillary: 137 mg/dL — ABNORMAL HIGH (ref 70–99)
Glucose-Capillary: 186 mg/dL — ABNORMAL HIGH (ref 70–99)

## 2021-11-21 LAB — HEMOGLOBIN A1C
Hgb A1c MFr Bld: 5.5 % (ref 4.8–5.6)
Mean Plasma Glucose: 111.15 mg/dL

## 2021-11-21 LAB — COMPREHENSIVE METABOLIC PANEL
ALT: 13 U/L (ref 0–44)
AST: 16 U/L (ref 15–41)
Albumin: 3 g/dL — ABNORMAL LOW (ref 3.5–5.0)
Alkaline Phosphatase: 111 U/L (ref 38–126)
Anion gap: 14 (ref 5–15)
BUN: 51 mg/dL — ABNORMAL HIGH (ref 6–20)
CO2: 25 mmol/L (ref 22–32)
Calcium: 9.6 mg/dL (ref 8.9–10.3)
Chloride: 98 mmol/L (ref 98–111)
Creatinine, Ser: 3.19 mg/dL — ABNORMAL HIGH (ref 0.44–1.00)
GFR, Estimated: 17 mL/min — ABNORMAL LOW (ref >60)
Glucose, Bld: 119 mg/dL — ABNORMAL HIGH (ref 70–99)
Potassium: 6.6 mmol/L (ref 3.5–5.1)
Sodium: 137 mmol/L (ref 135–145)
Total Bilirubin: 3 mg/dL — ABNORMAL HIGH (ref 0.3–1.2)
Total Protein: 6.9 g/dL (ref 6.5–8.1)

## 2021-11-21 LAB — POCT I-STAT 7, (LYTES, BLD GAS, ICA,H+H)
Acid-Base Excess: 1 mmol/L (ref 0.0–2.0)
Acid-Base Excess: 4 mmol/L — ABNORMAL HIGH (ref 0.0–2.0)
Acid-Base Excess: 2 mmol/L (ref 0.0–2.0)
Bicarbonate: 28.3 mmol/L — ABNORMAL HIGH (ref 20.0–28.0)
Bicarbonate: 31.2 mmol/L — ABNORMAL HIGH (ref 20.0–28.0)
Bicarbonate: 30.3 mmol/L — ABNORMAL HIGH (ref 20.0–28.0)
Calcium, Ion: 1.3 mmol/L (ref 1.15–1.40)
Calcium, Ion: 1.3 mmol/L (ref 1.15–1.40)
Calcium, Ion: 1.3 mmol/L (ref 1.15–1.40)
HCT: 38 % (ref 36.0–46.0)
HCT: 37 % (ref 36.0–46.0)
HCT: 37 % (ref 36.0–46.0)
Hemoglobin: 12.9 g/dL (ref 12.0–15.0)
Hemoglobin: 12.6 g/dL (ref 12.0–15.0)
Hemoglobin: 12.6 g/dL (ref 12.0–15.0)
O2 Saturation: 99 %
O2 Saturation: 99 %
O2 Saturation: 100 %
Patient temperature: 97.6
Patient temperature: 97.7
Patient temperature: 97.7
Potassium: 6.9 mmol/L (ref 3.5–5.1)
Potassium: 6.2 mmol/L — ABNORMAL HIGH (ref 3.5–5.1)
Potassium: 5.8 mmol/L — ABNORMAL HIGH (ref 3.5–5.1)
Sodium: 134 mmol/L — ABNORMAL LOW (ref 135–145)
Sodium: 138 mmol/L (ref 135–145)
Sodium: 138 mmol/L (ref 135–145)
TCO2: 30 mmol/L (ref 22–32)
TCO2: 33 mmol/L — ABNORMAL HIGH (ref 22–32)
TCO2: 32 mmol/L (ref 22–32)
pCO2 arterial: 55.7 mmHg — ABNORMAL HIGH (ref 32–48)
pCO2 arterial: 53.9 mmHg — ABNORMAL HIGH (ref 32–48)
pCO2 arterial: 59.8 mmHg — ABNORMAL HIGH (ref 32–48)
pH, Arterial: 7.311 — ABNORMAL LOW (ref 7.35–7.45)
pH, Arterial: 7.368 (ref 7.35–7.45)
pH, Arterial: 7.309 — ABNORMAL LOW (ref 7.35–7.45)
pO2, Arterial: 129 mmHg — ABNORMAL HIGH (ref 83–108)
pO2, Arterial: 126 mmHg — ABNORMAL HIGH (ref 83–108)
pO2, Arterial: 328 mmHg — ABNORMAL HIGH (ref 83–108)

## 2021-11-21 LAB — URINALYSIS, ROUTINE W REFLEX MICROSCOPIC
Bilirubin Urine: NEGATIVE
Glucose, UA: NEGATIVE mg/dL
Hgb urine dipstick: NEGATIVE
Ketones, ur: NEGATIVE mg/dL
Leukocytes,Ua: NEGATIVE
Nitrite: NEGATIVE
Protein, ur: 30 mg/dL — AB
Specific Gravity, Urine: 1.008 (ref 1.005–1.030)
pH: 5 (ref 5.0–8.0)

## 2021-11-21 LAB — RAPID URINE DRUG SCREEN, HOSP PERFORMED
Amphetamines: NOT DETECTED
Barbiturates: NOT DETECTED
Benzodiazepines: NOT DETECTED
Cocaine: NOT DETECTED
Opiates: NOT DETECTED
Tetrahydrocannabinol: NOT DETECTED

## 2021-11-21 LAB — ECHOCARDIOGRAM COMPLETE
Area-P 1/2: 4.15 cm2
Height: 60 in
S' Lateral: 2.2 cm
Weight: 3149.93 oz

## 2021-11-21 LAB — STREP PNEUMONIAE URINARY ANTIGEN: Strep Pneumo Urinary Antigen: NEGATIVE

## 2021-11-21 LAB — COOXEMETRY PANEL
Carboxyhemoglobin: 2 % — ABNORMAL HIGH (ref 0.5–1.5)
Carboxyhemoglobin: 2.2 % — ABNORMAL HIGH (ref 0.5–1.5)
Methemoglobin: 2 % — ABNORMAL HIGH (ref 0.0–1.5)
Methemoglobin: 0.8 % (ref 0.0–1.5)
O2 Saturation: 84.2 %
O2 Saturation: 73.4 %
Total hemoglobin: 10.6 g/dL — ABNORMAL LOW (ref 12.0–16.0)
Total hemoglobin: 10.7 g/dL — ABNORMAL LOW (ref 12.0–16.0)

## 2021-11-21 LAB — CBC
HCT: 31.6 % — ABNORMAL LOW (ref 36.0–46.0)
Hemoglobin: 10.9 g/dL — ABNORMAL LOW (ref 12.0–15.0)
MCH: 28.1 pg (ref 26.0–34.0)
MCHC: 34.5 g/dL (ref 30.0–36.0)
MCV: 81.4 fL (ref 80.0–100.0)
Platelets: 228 10*3/uL (ref 150–400)
RBC: 3.88 MIL/uL (ref 3.87–5.11)
RDW: 17.8 % — ABNORMAL HIGH (ref 11.5–15.5)
WBC: 6.9 10*3/uL (ref 4.0–10.5)
nRBC: 0.4 % — ABNORMAL HIGH (ref 0.0–0.2)

## 2021-11-21 LAB — MRSA NEXT GEN BY PCR, NASAL: MRSA by PCR Next Gen: NOT DETECTED

## 2021-11-21 LAB — LACTIC ACID, PLASMA
Lactic Acid, Venous: 2.6 mmol/L (ref 0.5–1.9)
Lactic Acid, Venous: 1.2 mmol/L (ref 0.5–1.9)

## 2021-11-21 LAB — HIV ANTIBODY (ROUTINE TESTING W REFLEX): HIV Screen 4th Generation wRfx: NONREACTIVE

## 2021-11-21 LAB — PHOSPHORUS: Phosphorus: 5.1 mg/dL — ABNORMAL HIGH (ref 2.5–4.6)

## 2021-11-21 LAB — ETHANOL: Alcohol, Ethyl (B): <10 mg/dL (ref <10)

## 2021-11-21 LAB — PROCALCITONIN: Procalcitonin: 0.7 ng/mL

## 2021-11-21 LAB — AMMONIA: Ammonia: 41 umol/L — ABNORMAL HIGH (ref 9–35)

## 2021-11-21 LAB — MAGNESIUM: Magnesium: 1.5 mg/dL — ABNORMAL LOW (ref 1.7–2.4)

## 2021-11-21 LAB — CK: Total CK: 62 U/L (ref 38–234)

## 2021-11-21 MED ORDER — SODIUM CHLORIDE 0.9 % IV SOLN
2.0000 g | INTRAVENOUS | Status: AC
  Administered 2021-11-21 – 2021-11-26 (×6): 2 g via INTRAVENOUS
  Filled 2021-11-21 (×6): qty 20

## 2021-11-21 MED ORDER — FUROSEMIDE 10 MG/ML IJ SOLN
80.0000 mg | Freq: Once | INTRAMUSCULAR | Status: AC
  Administered 2021-11-21: 80 mg via INTRAVENOUS
  Filled 2021-11-21: qty 8

## 2021-11-21 MED ORDER — INSULIN ASPART 100 UNIT/ML IJ SOLN
10.0000 [IU] | Freq: Once | INTRAMUSCULAR | Status: AC
  Administered 2021-11-21: 10 [IU] via INTRAVENOUS

## 2021-11-21 MED ORDER — SODIUM CHLORIDE 0.9 % IV SOLN
500.0000 mg | INTRAVENOUS | Status: AC
  Administered 2021-11-21 – 2021-11-24 (×4): 500 mg via INTRAVENOUS
  Filled 2021-11-21 (×4): qty 5

## 2021-11-21 MED ORDER — SODIUM BICARBONATE 8.4 % IV SOLN
50.0000 meq | Freq: Once | INTRAVENOUS | Status: AC
  Administered 2021-11-21: 50 meq via INTRAVENOUS
  Filled 2021-11-21: qty 50

## 2021-11-21 MED ORDER — DEXTROSE 50 % IV SOLN
1.0000 | Freq: Once | INTRAVENOUS | Status: AC
  Administered 2021-11-21: 50 mL via INTRAVENOUS
  Filled 2021-11-21: qty 50

## 2021-11-21 MED ORDER — CHLORHEXIDINE GLUCONATE CLOTH 2 % EX PADS
6.0000 | MEDICATED_PAD | Freq: Every day | CUTANEOUS | Status: DC
Start: 2021-11-21 — End: 2021-12-02
  Administered 2021-11-21 – 2021-12-01 (×11): 6 via TOPICAL

## 2021-11-21 MED ORDER — METOLAZONE 5 MG PO TABS
5.0000 mg | ORAL_TABLET | Freq: Once | ORAL | Status: AC
Start: 2021-11-21 — End: 2021-11-21
  Administered 2021-11-21: 5 mg via ORAL
  Filled 2021-11-21: qty 1

## 2021-11-21 MED ORDER — CALCIUM GLUCONATE 10 % IV SOLN: 2.0000 g | Freq: Once | INTRAVENOUS | Status: DC

## 2021-11-21 MED ORDER — ORAL CARE MOUTH RINSE
15.0000 mL | OROMUCOSAL | Status: DC
  Administered 2021-11-21 – 2021-11-25 (×18): 15 mL via OROMUCOSAL

## 2021-11-21 MED ORDER — CALCIUM GLUCONATE-NACL 1-0.675 GM/50ML-% IV SOLN
1.0000 g | Freq: Once | INTRAVENOUS | Status: AC
Start: 2021-11-21 — End: 2021-11-21
  Administered 2021-11-21: 1000 mg via INTRAVENOUS
  Filled 2021-11-21: qty 50

## 2021-11-21 MED ORDER — ORAL CARE MOUTH RINSE
15.0000 mL | OROMUCOSAL | Status: DC | PRN
Start: 2021-11-21 — End: 2021-11-26

## 2021-11-21 MED ORDER — MAGNESIUM SULFATE 4 GM/100ML IV SOLN
4.0000 g | Freq: Once | INTRAVENOUS | Status: AC
  Administered 2021-11-21: 4 g via INTRAVENOUS
  Filled 2021-11-21: qty 100

## 2021-11-21 MED ORDER — FUROSEMIDE 10 MG/ML IJ SOLN
30.0000 mg/h | INTRAVENOUS | Status: DC
  Administered 2021-11-21: 8 mg/h via INTRAVENOUS
  Administered 2021-11-21 – 2021-11-22 (×3): 20 mg/h via INTRAVENOUS
  Administered 2021-11-23: 30 mg/h via INTRAVENOUS
  Administered 2021-11-23: 20 mg/h via INTRAVENOUS
  Administered 2021-11-23 – 2021-11-24 (×2): 30 mg/h via INTRAVENOUS
  Filled 2021-11-21 (×11): qty 20

## 2021-11-21 MED ORDER — SILDENAFIL CITRATE 20 MG PO TABS
20.0000 mg | ORAL_TABLET | Freq: Three times a day (TID) | ORAL | Status: DC
  Administered 2021-11-21 – 2021-11-25 (×13): 20 mg via ORAL
  Filled 2021-11-21 (×16): qty 1

## 2021-11-21 MED ORDER — MACITENTAN 10 MG PO TABS
10.0000 mg | ORAL_TABLET | Freq: Every day | ORAL | Status: DC
  Administered 2021-11-21 – 2021-11-30 (×10): 10 mg via ORAL
  Filled 2021-11-21 (×11): qty 1

## 2021-11-21 NOTE — Progress Notes (Signed)
Pt removed from BIPAP and placed on 2L [place]. Pts WOB WNL. RN at bedside. RT to continue to monitor.

## 2021-11-21 NOTE — Progress Notes (Signed)
Pharmacy Consult for Pulmonary Hypertension Treatment   Indication - Continuation of prior to admission medication   Patient is 56 y.o.  with history of PAH on chronic Macitentan (Opsumit) PTA and will be continued while hospitalized.   Continuing this medication order as an inpatient requires that monitoring parameters per REMS requirements must be met.  Chronic therapy is under the supervision of [name] (MD name) who is enrolled in the REMS program and is being notified of continuation of therapy. A staff message in EPIC has been sent notifying the certified prescriber.  Per patient report has previously been educated on Pregnancy Risk and Hepatotoxicity . On admission pregnancy risk has been assessed and no monitoring required. Hepatic function has been evaluated. AST/ALT appropriate to continue medication at this time.     Latest Ref Rng & Units 11/21/2021    3:07 AM 11/04/2021    8:47 PM 11/12/2021    5:33 PM  Hepatic Function  Total Protein 6.5 - 8.1 g/dL 6.9  8.5  8.2   Albumin 3.5 - 5.0 g/dL 3.0  3.7  3.6   AST 15 - 41 U/L _0 ALT 0 - 44 U/L _1 Alk Phosphatase 38 - 126 U/L 111  135  128   Total Bilirubin 0.3 - 1.2 mg/dL 3.0  3.7  3.9     If any question arise or pregnancy is identified during hospitalization, contact for bosentan and macitentan: [phone]; ambrisentan: [phone].  Thank for you allowing [place] to participate in the care of this patient.

## 2021-11-21 NOTE — Progress Notes (Signed)
Subjective:  Now in ICU-  good news is that her potassium is down now to 5.8 and creatinine is not worse than last night-  bad news is that she only made 675 of urine and her CVP is very high- still on bipap-  on dobutamine  Objective Vital signs in last 24 hours: Vitals:   11/21/21 0645 11/21/21 0700 11/21/21 0715 11/21/21 0730  BP: 108/78 106/75 105/80 102/77  Pulse: 98 97 92 99  Resp: 18 (!) 25 (!) 0 14  Temp:      TempSrc:      SpO2: 94% 95% 99% 97%  Weight:      Height:       Weight change:   Intake/Output Summary (Last 24 hours) at 11/21/2021 0758 Last data filed at 11/21/2021 0700 Gross per 24 hour  Intake 577.68 ml  Output 675 ml  Net -97.32 ml    Assessment/Plan:56 year old BF with severe pulmonary HTN and chronic volume overload-  presents with A on CRF and hyperkalemia in the setting of what appears to be decompensated right heart failure 1.Renal- CKD as an OP which has been worsening over the last few months in the setting of escalating diuresis.  Renal ultrasound shows a  ? small left kidney but no other issues, urine is bland.  This all appears to be a cardiorenal phenomenon.   Her potassium has come down with just holding her repletion but the main issue is volume overload.  I have discussed with Dr. [name].  The plan is for aggressive attempt at diuresis but probably not likely to be successful.  Since pt is so young would be willing to offer a trial of CRRT to normalize volume and see if her kidneys can recover.  If they do not this is a difficult situation as intermittent Dialysis in the setting of severe pulmonary HTN and right heart failure is problematic-  the patients really cannot tolerate 2. Hyperkalemia-  due to AKI and high dose potassium repletion.  She has had hyperkalemia for several hours while here in the ER and has not had any arythmia or hemodynamic instability-  because dialysis is not tolerated in these types of patients and she was on 100 meq of potassium  daily -  I would like to hold supps and treat medically to see if we can get it to correct that way-   is trending better with medical treatment alone 3. Hypertension/volume  - clearly volume overloaded but very difficult to diurese also in patients with right sided heart failure-  per CCM and cards- plan is for lasix drip/metolazone with trial of CRRT if not successful  4. Pulmonary-  possible PNA/atelectasis-  ABG showing resp acidosis-  now on bipap with improvement-  per CCM 5. Anemia  - not a major issue at this time     Louis Meckel    Labs: Basic Metabolic Panel: Recent Labs  Lab 11/05/2021 1733 11/15/2021 2047 11/09/2021 2231 11/21/21 0307 11/21/21 0518 11/21/21 0610  NA 132* 134*   < > 137 138 138  K >7.5* >7.5*   < > 6.6* 6.2* 5.8*  CL 97* 101  --  98  --   --   CO2 23 21*  --  25  --   --   GLUCOSE 105* 106*  --  119*  --   --   BUN 51* 52*  --  51*  --   --   CREATININE 3.30* 3.26*  --  3.19*  --   --   CALCIUM 9.6 9.7  --  9.6  --   --   PHOS  --   --   --  5.1*  --   --    < > = values in this interval not displayed.   Liver Function Tests: Recent Labs  Lab 10/26/2021 1733 11/04/2021 2047 11/21/21 0307  AST _0 ALT _1 ALKPHOS 128* 135* 111  BILITOT 3.9* 3.7* 3.0*  PROT 8.2* 8.5* 6.9  ALBUMIN 3.6 3.7 3.0*   No results for input(s): "LIPASE", "AMYLASE" in the last 168 hours. Recent Labs  Lab 11/08/2021 2047 11/21/21 0346  AMMONIA 43* 41*   CBC: Recent Labs  Lab 11/11/2021 1450 11/12/2021 1530 10/30/2021 2305 11/21/21 0100 11/21/21 0307 11/21/21 0518 11/21/21 0610  WBC 7.2  --  7.8  --  6.9  --   --   NEUTROABS 5.0  --   --   --   --   --   --   HGB 11.9*   < > 11.8*   < > 10.9* 12.6 12.6  HCT 35.1*   < > 34.1*   < > 31.6* 37.0 37.0  MCV 82.0  --  82.4  --  81.4  --   --   PLT 317  --  249  --  228  --   --    < > = values in this interval not displayed.   Cardiac Enzymes: No results for input(s): "CKTOTAL", "CKMB", "CKMBINDEX",  "TROPONINI" in the last 168 hours. CBG: Recent Labs  Lab 11/09/2021 2032 11/01/2021 2346 11/21/21 0207 11/21/21 0325 11/21/21 0504  GLUCAP 109* 90 186* 120* 236*    Iron Studies: No results for input(s): "IRON", "TIBC", "TRANSFERRIN", "FERRITIN" in the last 72 hours. Studies/Results: DG Chest Port 1 View  Result Date: 11/21/2021 CLINICAL DATA:  56 year old female with respiratory failure. EXAM: PORTABLE CHEST 1 VIEW COMPARISON:  Portable chest 0158 hours today.  Chest CT yesterday. FINDINGS: Portable AP upright view at 0649 hours. Ongoing very low lung volumes. Stable left subclavian central line. Stable cardiac size and mediastinal contours. Lung base hypo ventilation appears greater on the left. No pneumothorax or pulmonary edema. Paucity of bowel gas in the upper abdomen. No acute osseous abnormality identified. IMPRESSION: 1. Ongoing low lung volumes. Small pleural effusions and left lower lobe collapse or consolidation better demonstrated by CT yesterday. 2. No new cardiopulmonary abnormality. Electronically Signed   By: Genevie Ann M.D.   On: 11/21/2021 07:20   DG CHEST PORT 1 VIEW  Result Date: 11/21/2021 CLINICAL DATA:  Central line placement. EXAM: PORTABLE CHEST 1 VIEW COMPARISON:  Chest radiograph dated 10/31/2021 and CT dated 11/18/2021. FINDINGS: Left subclavian central venous line with tip in the region of the cavoatrial junction. Shallow inspiration with left lung base atelectasis or infiltrate. No pneumothorax. Stable cardiac silhouette. No acute osseous pathology. IMPRESSION: Left subclavian central venous line with tip in the region of the cavoatrial junction. No pneumothorax. Electronically Signed   By: Anner Crete M.D.   On: 11/21/2021 02:08   US RENAL  Result Date: 10/30/2021 CLINICAL DATA:  Acute renal injury EXAM: RENAL / URINARY TRACT ULTRASOUND COMPLETE COMPARISON:  None Available. FINDINGS: Right Kidney: Renal measurements: 8.5 x 4.7 x 5.9 cm. = volume: 124 mL.  Echogenicity within normal limits. No mass or hydronephrosis visualized. Left Kidney: Renal measurements: 6.6 x 3.6 x 4.0 cm. = volume: 50 mL. Left kidney is  incompletely evaluated due to poor acoustical window. Bladder: Appears normal for degree of bladder distention. Other: Ascites is noted. IMPRESSION: No obstructive changes are noted. Left kidney is not well visualized due to poor acoustical window. Diffuse abdominal ascites is seen. Electronically Signed   By: Inez Catalina M.D.   On: 11/18/2021 23:44   CT CHEST WO CONTRAST  Result Date: 10/27/2021 CLINICAL DATA:  Respiratory distress. Concern for pulmonary embolism. EXAM: CT CHEST WITHOUT CONTRAST TECHNIQUE: Multidetector CT imaging of the chest was performed following the standard protocol without IV contrast. RADIATION DOSE REDUCTION: This exam was performed according to the departmental dose-optimization program which includes automated exposure control, adjustment of the mA and/or kV according to patient size and/or use of iterative reconstruction technique. COMPARISON:  Chest CT dated 07/06/2021 and radiograph dated 11/17/2021. FINDINGS: Evaluation of this exam is limited due to respiratory motion as well as in the absence of intravenous contrast. Cardiovascular: Borderline cardiomegaly. Trace pericardial effusion. Mild atherosclerotic calcification of the aortic arch. No aneurysmal dilatation. Mild dilatation of the pulmonary trunk which may suggest pulmonary hypertension. Mediastinum/Nodes: No hilar or mediastinal adenopathy. The esophagus is grossly unremarkable. No mediastinal fluid collection. Lungs/Pleura: There is partial consolidation of the left lower lobe with air bronchogram which may represent atelectasis or infiltrate. Underlying mass is not excluded. Trace left pleural effusion. Minimal right lung base atelectasis. Overall there is shallow inspiration. No pneumothorax. The central airways are patent. Upper Abdomen: Partially visualized  ascites. Musculoskeletal: Diffuse subcutaneous edema and anasarca. Osteopenia with degenerative changes of the spine. Probable old sternal fracture. No acute osseous pathology. IMPRESSION: 1. Low lung volumes with partial consolidation of the left lower lobe with air bronchogram which may represent atelectasis or infiltrate. 2. Trace left pleural effusion. 3. Partially visualized ascites and anasarca. 4. Aortic Atherosclerosis (ICD10-I70.0). Electronically Signed   By: Anner Crete M.D.   On: 11/14/2021 21:20   DG Chest Portable 1 View  Result Date: 10/29/2021 CLINICAL DATA:  Worsening dyspnea EXAM: PORTABLE CHEST 1 VIEW COMPARISON:  Film from earlier in the same day. FINDINGS: Cardiac shadow is prominent but accentuated by the portable technique. The overall inspiratory effort is again poor with left basilar atelectatic changes. Vascular crowding is noted centrally. No acute bony abnormality is seen. IMPRESSION: Persistent left basilar atelectasis. No new focal abnormality is seen. Electronically Signed   By: Inez Catalina M.D.   On: 10/26/2021 19:38   DG Chest Portable 1 View  Result Date: 11/08/2021 CLINICAL DATA:  Dyspnea. EXAM: PORTABLE CHEST 1 VIEW COMPARISON:  03/20/2021 FINDINGS: Heart size is within normal limits allowing for very low lung volumes. Opacity at the left lung base may be due to atelectasis or infiltrate. Right lung appears clear. No evidence of pleural effusion. IMPRESSION: Very low lung volumes. Left basilar atelectasis versus infiltrate. Electronically Signed   By: Marlaine Hind M.D.   On: 10/26/2021 15:28   Medications: Infusions:  sodium chloride     azithromycin     cefTRIAXone (ROCEPHIN)  IV     DOBUTamine 2.5 mcg/kg/min (11/21/21 0700)   furosemide (LASIX) 200 mg in dextrose 5 % 100 mL (2 mg/mL) infusion 8 mg/hr (11/21/21 0700)   magnesium sulfate bolus IVPB      Scheduled Medications:  Chlorhexidine Gluconate Cloth  6 each Topical Daily   heparin  5,000 Units  Subcutaneous Q8H   insulin aspart  0-6 Units Subcutaneous Q4H   macitentan  10 mg Oral Daily   metolazone  5 mg Oral Once  have reviewed scheduled and prn medications.  Physical Exam: General: slightly more alert-  on bipap Heart:tachy Lungs: CBS bilat Abdomen: non tender Extremities: pitting edema Dialysis Access: none yet     11/21/2021,7:58 AM  LOS: 1 day

## 2021-11-21 NOTE — Progress Notes (Signed)
VASCULAR LAB    Bilateral lower extremity venous duplex has been performed.  See CV proc for preliminary results.   Hilario Robarts, RVT 11/21/2021, 5:09 PM

## 2021-11-21 NOTE — Procedures (Signed)
Central Venous Catheter Insertion Procedure Note  Shannon [name]  [id number]  [birth year]  Date:11/21/21  Time:1:49 AM   Provider Performing:Carleen Rhue   Procedure: Insertion of Non-tunneled Central Venous [phone]) with US guidance (17711)   Indication(s) Medication administration and Difficult access  Consent Risks of the procedure as well as the alternatives and risks of each were explained to the patient and/or caregiver.  Consent for the procedure was obtained and is signed in the bedside chart  Anesthesia Topical only with 1% lidocaine   Timeout Verified patient identification, verified procedure, site/side was marked, verified correct patient position, special equipment/implants available, medications/allergies/relevant history reviewed, required imaging and test results available.  Sterile Technique Maximal sterile technique including full sterile barrier drape, hand hygiene, sterile gown, sterile gloves, mask, hair covering, sterile ultrasound probe cover (if used).  Procedure Description Area of catheter insertion was cleaned with chlorhexidine and draped in sterile fashion.  With real-time ultrasound guidance a 20 cm 57F triple lumen central venous catheter was placed into the left subclavian vein. Nonpulsatile blood flow and easy flushing noted in all ports.  The catheter was sutured in place and sterile dressing applied.     Complications/Tolerance None; patient tolerated the procedure well. Chest X-ray is ordered to verify placement for internal jugular or subclavian cannulation.   Chest x-ray is not ordered for femoral cannulation.  EBL Minimal  Specimen(s) [place].  Kipp Brood, MD [facility] ICU Physician [place]  Pager: [phone] Or Epic Secure Chat After hours: [phone].  11/21/2021, 1:49 AM

## 2021-11-21 NOTE — ED Notes (Signed)
Pt clean and dry prior to transport upstairs
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
## 2021-11-21 NOTE — Progress Notes (Addendum)
[facility] Progress Note Patient Name: Shannon [name] DOB: 04/25/1966 MRN: [id number]   Date of Service  11/21/2021  HPI/Events of Note  55-y/o F, acute hypoxic and hypercarbic respiratory failure due to decompensated right heart failure and acute decompensated diastolic left ventricular failure requiring noninvasive ventilation.   Known severe pulmonary hypertension (WHO group 1) with a pulmonary artery systolic pressure on RHC of 70 mmHg.   P/w worsening edema and shortness of breath ,  more confused.  Placed on  BiPAP, central line and Arterial line placed   eICU Interventions  1.Acute respiratory failure with hypoxia and hypercarbia:  -chf exacerbation +/- decompensated RV with RV-> LV compromise  -Possible LLL pneumonia - bipap settings 18/8 -repeat abg / may need intubation, but in general carries high risk with such high PA pressures - avoid if possible  -ABG without much change, slight reduction in Pco2  -currently stable appearing on BiPAP, continue as tolerated  -Lasix given-> can administer aggressively as much as renal function and BP will allow . Has not yet started drip  -Continue Rocephin/azithromycin for cap ppx -check echo   2. Cardiac/LV/RV- CHF exacerbation Hx of severe pulmonary hypertension:  -echo 11/22 with EF 60-65%, severe RV enlargement with severely decreased RV systolic function, PASP 66 mmHg- has diffuse Anasarca/ Lower extremity swelling  -lasix given-> starting infusion  -monitor Cvp-  -home torsemide, macitentan, and tadalafil are being held while on BiPAP-  -possible role for [place], if intubated  -? Availability/feasibility of Tyvaso inhaler  -daily weights; strict I/o's -echo -On Dobutamine infusion    3. Encephalopathy: from hypercarbia/uremia /Hyperammonemia -adjust bipap settings to normocapnea -intubation if Co2 continues up  -limit / DC sedating meds -lactulose enema given;    4. Hyperkalemia K= 6.9-> 6.6/ AKI:   -possible cardiorenal syndrome -nephro consulted -continue with Lasix as tolerated - bolus 64m before infusion  -Ca Gluconate/Bicarb/D50/Insulin x1         Draya Felker N Adrienne Delay 11/21/2021, 3:16 AM

## 2021-11-21 NOTE — Progress Notes (Signed)
  Echocardiogram 2D Echocardiogram has been performed.  Johny Chess 11/21/2021, 10:22 AM

## 2021-11-21 NOTE — Progress Notes (Signed)
NAME:  Shannon [name], MRN:  [id number], DOB:  Oct 23, 1965, LOS: 1 ADMISSION DATE:  11/02/2021, CONSULTATION DATE:  7/28 REFERRING MD:  Dr. Regenia Skeeter, CHIEF COMPLAINT:  chf exacerbation   History of Present Illness:  Patient is a 56 year old female with pertinent PMH HTN, DMT2, severe pulmonary HTN followed by [name] presents to [facility] ED on 7/28 with SOB.  Patient has been experiencing weakness for the past several days and having trouble walking without getting SOB.  Today on 7/28 patient was more confused per family member.  Also was having trouble breathing.  Patient wears 2L [place] at home.  EMS called and transported patient to [facility] ED.  Patient sats low in the 80s.  Upon arrival to [facility] ED on 7/28, patient sats improved on 6L [place].  Hemodynamically stable.  Patient Aox4. CXR showing LLL atelectasis. CT chest showing LLL consolidation. Azithromycin/rocephin given. Troponin wnl. BNP 1,014. ABG initially 7.34, 45, 63, 25. Covid/flu negative. Na 132, K 7.5, creat 3.3, bun 51, ammonia 43. Patient began to have worsening resp distress and was placed on bipap. Given calcium, insulin/dextrose, albuterol, lasix. Patient mental status worsening on bipap. Repeat abg 7.25, 58, 71, 25. Patient arousable but remains lethargic. PCCM consulted.  Pertinent  Medical History   Past Medical History:  Diagnosis Date   CHF (congestive heart failure) ([place])    Diabetes ([place])    Hypertension    Inflammatory myopathy    treated 2012   Pulmonary hypertension ([place])    Wears glasses    reading     Significant Hospital Events: Including procedures, antibiotic start and stop dates in addition to other pertinent events   7/28 admitted [facility] resp failure on bipap  Interim History / Subjective:  Doing okay, having some BIPAP mask leakage.  Objective   Blood pressure 102/77, pulse 99, temperature 97.7 F (36.5 C), temperature source Axillary, resp. rate 14, height 5' (1.524 m), weight 89.3 kg, SpO2 97 %. CVP:  [23  mmHg-42 mmHg] 34 mmHg  FiO2 (%):  [70 %] 70 %   Intake/Output Summary (Last 24 hours) at 11/21/2021 0738 Last data filed at 11/21/2021 0700 Gross per 24 hour  Intake 577.68 ml  Output 675 ml  Net -97.32 ml   Filed Weights   11/23/2021 1454 11/21/21 0341  Weight: 89.4 kg 89.3 kg    Examination: On BIPAP Follows commands Crackles bilaterally JVP up Marked edema Ext warm to touch but under warming blanket  ABG acute on chronic hypercarbia CXR combo vascular congestion and ? LLL infiltrate   Resolved Hospital Problem list     Assessment & Plan:  Acute on chronic hypoxemic and hypercarbic respiratory failure due to decompensated right heart failure and possible LLL CAP.  High risk for progression to intubation, hanging on barely with BIPAP.  Acute kidney failure- no obvious obstruction on kidney US.  Would be most c/w cardiorenal, mixed picutre with ATN possible as well.  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 [name] to see regarding further inotrope titration - Recheck BMP at noon, temporizing measures as ordered - BIPAP mask adjustment, still following commands and protecting airway, hopefully can avoid intubation as is risky with advanced RV failure - Low threshold for CRRT, will see what Dr. [name] thinks - Rocephin/azithromycin for CAP, check urine Ag, Qtc okay - f/u LE duplex - Sister updated at bedside  34 min cc time Erskine Emery MD PCCM  Best  Practice (right click and "Reselect all SmartList Selections" daily)   Diet/type: NPO DVT prophylaxis: prophylactic heparin  GI prophylaxis: N/A Lines: N/A Foley:  N/A Code Status:  full code Last date of multidisciplinary goals of care discussion [7/28 spoke with sister and updated at bedside; would like [place] to remain full code.]  35 min cc time

## 2021-11-21 NOTE — Procedures (Signed)
Arterial Catheter Insertion Procedure Note  Shannon [name]  [id number]  23-May-1965  Date:11/21/21  Time:1:52 AM    Provider Performing: Kipp Brood    Procedure: Insertion of Arterial Line [phone] with US guidance ([id number])   Indication(s) Blood pressure monitoring and/or need for frequent ABGs  Consent Risks of the procedure as well as the alternatives and risks of each were explained to the patient and/or caregiver.  Consent for the procedure was obtained and is signed in the bedside chart  Anesthesia None   Time Out Verified patient identification, verified procedure, site/side was marked, verified correct patient position, special equipment/implants available, medications/allergies/relevant history reviewed, required imaging and test results available.   Sterile Technique Maximal sterile technique including full sterile barrier drape, hand hygiene, sterile gown, sterile gloves, mask, hair covering, sterile ultrasound probe cover (if used).   Procedure Description Area of catheter insertion was cleaned with chlorhexidine and draped in sterile fashion. With real-time ultrasound guidance an arterial catheter was placed into the left radial artery.  Appropriate arterial tracings confirmed on monitor.     Complications/Tolerance None; patient tolerated the procedure well.   EBL Minimal   Specimen(s) ABG  Kipp Brood, MD [facility] ICU Physician [place]  Pager: [phone] Or Epic Secure Chat After hours: [phone].  11/21/2021, 1:53 AM

## 2021-11-21 NOTE — Progress Notes (Signed)
Pt transported from ED29 to 2H21 without any complications. RN at bedside.

## 2021-11-22 DIAGNOSIS — J9601 Acute respiratory failure with hypoxia: Secondary | ICD-10-CM | POA: Diagnosis not present

## 2021-11-22 DIAGNOSIS — I50810 Right heart failure, unspecified: Secondary | ICD-10-CM | POA: Diagnosis not present

## 2021-11-22 LAB — BASIC METABOLIC PANEL
Anion gap: 16 — ABNORMAL HIGH (ref 5–15)
Anion gap: 11 (ref 5–15)
BUN: 49 mg/dL — ABNORMAL HIGH (ref 6–20)
BUN: 50 mg/dL — ABNORMAL HIGH (ref 6–20)
CO2: 26 mmol/L (ref 22–32)
CO2: 28 mmol/L (ref 22–32)
Calcium: 9.3 mg/dL (ref 8.9–10.3)
Calcium: 9.2 mg/dL (ref 8.9–10.3)
Chloride: 93 mmol/L — ABNORMAL LOW (ref 98–111)
Chloride: 94 mmol/L — ABNORMAL LOW (ref 98–111)
Creatinine, Ser: 2.92 mg/dL — ABNORMAL HIGH (ref 0.44–1.00)
Creatinine, Ser: 2.89 mg/dL — ABNORMAL HIGH (ref 0.44–1.00)
GFR, Estimated: 19 mL/min — ABNORMAL LOW (ref >60)
GFR, Estimated: 19 mL/min — ABNORMAL LOW (ref >60)
Glucose, Bld: 112 mg/dL — ABNORMAL HIGH (ref 70–99)
Glucose, Bld: 132 mg/dL — ABNORMAL HIGH (ref 70–99)
Potassium: 4.6 mmol/L (ref 3.5–5.1)
Potassium: 4.4 mmol/L (ref 3.5–5.1)
Sodium: 135 mmol/L (ref 135–145)
Sodium: 133 mmol/L — ABNORMAL LOW (ref 135–145)

## 2021-11-22 LAB — GLUCOSE, CAPILLARY
Glucose-Capillary: 120 mg/dL — ABNORMAL HIGH (ref 70–99)
Glucose-Capillary: 117 mg/dL — ABNORMAL HIGH (ref 70–99)
Glucose-Capillary: 144 mg/dL — ABNORMAL HIGH (ref 70–99)
Glucose-Capillary: 141 mg/dL — ABNORMAL HIGH (ref 70–99)
Glucose-Capillary: 129 mg/dL — ABNORMAL HIGH (ref 70–99)
Glucose-Capillary: 106 mg/dL — ABNORMAL HIGH (ref 70–99)

## 2021-11-22 LAB — URINE CULTURE: Culture: NO GROWTH

## 2021-11-22 LAB — COOXEMETRY PANEL
Carboxyhemoglobin: 1.9 % — ABNORMAL HIGH (ref 0.5–1.5)
Methemoglobin: 0.7 % (ref 0.0–1.5)
O2 Saturation: 72.5 %
Total hemoglobin: 10.2 g/dL — ABNORMAL LOW (ref 12.0–16.0)

## 2021-11-22 MED ORDER — MIDODRINE HCL 5 MG PO TABS
5.0000 mg | ORAL_TABLET | Freq: Three times a day (TID) | ORAL | Status: DC
  Administered 2021-11-22 – 2021-11-23 (×4): 5 mg via ORAL
  Filled 2021-11-22 (×4): qty 1

## 2021-11-22 MED ORDER — METOLAZONE 5 MG PO TABS
5.0000 mg | ORAL_TABLET | Freq: Two times a day (BID) | ORAL | Status: DC
  Administered 2021-11-22 – 2021-11-24 (×5): 5 mg via ORAL
  Filled 2021-11-22 (×5): qty 1

## 2021-11-22 MED ORDER — ONDANSETRON HCL 4 MG/2ML IJ SOLN
4.0000 mg | Freq: Three times a day (TID) | INTRAMUSCULAR | Status: DC | PRN
Start: 2021-11-22 — End: 2021-12-02
  Administered 2021-11-22 – 2021-11-30 (×3): 4 mg via INTRAVENOUS
  Filled 2021-11-22 (×3): qty 2

## 2021-11-22 MED ORDER — SIMETHICONE 80 MG PO CHEW
80.0000 mg | CHEWABLE_TABLET | Freq: Four times a day (QID) | ORAL | Status: DC | PRN
  Administered 2021-11-22 – 2021-11-28 (×4): 80 mg via ORAL
  Filled 2021-11-22 (×5): qty 1

## 2021-11-22 NOTE — Progress Notes (Signed)
Patient ID: Shannon [name], female   DOB: 09-06-1965, 56 y.o.   MRN: [id number]     Advanced Heart Failure Rounding Note  PCP-Cardiologist: Fransico Him, MD   Subjective:    UOP 1335 yesterday on Lasix gtt + metolazone but creatinine trending down, 3.13 => 2.92.  Co-ox 73% on dobutamine 2.5.  SBP 90s-100s.  She is on HFNC.    Overall feels better but CVP still markedly high at 28.   Echo: EF 60-65%, D-shaped septum, moderate RV enlargement/moderate RV dysfunction, PASP 59, mod-severe TR, IVC dilated.    Objective:   Weight Range: 89.3 kg Body mass index is 38.45 kg/m.   Vital Signs:   Temp:  [96.2 F (35.7 C)-98.1 F (36.7 C)] 97.3 F (36.3 C) (07/30 0728) Pulse Rate:  [85-115] 100 (07/30 0728) Resp:  [10-34] 15 (07/30 0728) BP: (88-120)/(57-97) 101/69 (07/30 0715) SpO2:  [89 %-100 %] 93 % (07/30 0728) FiO2 (%):  [45 %-70 %] 45 % (07/30 0400) Weight:  [89.3 kg] 89.3 kg (07/30 0415) Last BM Date : 11/21/21  Weight change: Filed Weights   11/04/2021 1454 11/21/21 0341 11/22/21 0415  Weight: 89.4 kg 89.3 kg 89.3 kg    Intake/Output:   Intake/Output Summary (Last 24 hours) at 11/22/2021 0747 Last data filed at 11/22/2021 0600 Gross per 24 hour  Intake 754.42 ml  Output 1335 ml  Net -580.58 ml      Physical Exam    General:  Well appearing. No resp difficulty HEENT: Normal Neck: Supple. JVP 16+. Carotids 2+ bilat; no bruits. No lymphadenopathy or thyromegaly appreciated. Cor: PMI nondisplaced. Regular rate & rhythm. No rubs, gallops. 2/6 HSM LLSB.  Lungs: Clear Abdomen: Soft, nontender, nondistended. No hepatosplenomegaly. No bruits or masses. Good bowel sounds. Extremities: No cyanosis, clubbing, rash. 1+ edema 1/2 to knees bilaterally.  Neuro: Alert & orientedx3, cranial nerves grossly intact. moves all 4 extremities w/o difficulty. Affect pleasant   Telemetry   Sinus tachy 100s (personally reviewed)  Labs    CBC Recent Labs    10/30/2021 1450  10/30/2021 1530 10/30/2021 2305 11/21/21 0100 11/21/21 0307 11/21/21 0518 11/21/21 0610  WBC 7.2  --  7.8  --  6.9  --   --   NEUTROABS 5.0  --   --   --   --   --   --   HGB 11.9*   < > 11.8*   < > 10.9* 12.6 12.6  HCT 35.1*   < > 34.1*   < > 31.6* 37.0 37.0  MCV 82.0  --  82.4  --  81.4  --   --   PLT 317  --  249  --  228  --   --    < > = values in this interval not displayed.   Basic Metabolic Panel Recent Labs    11/21/21 0307 11/21/21 0518 11/21/21 1129 11/22/21 0316  NA 137   < > 136 135  K 6.6*   < > 5.5* 4.6  CL 98  --  101 93*  CO2 25  --  26 26  GLUCOSE 119*  --  60* 112*  BUN 51*  --  52* 49*  CREATININE 3.19*  --  3.13* 2.92*  CALCIUM 9.6  --  9.4 9.3  MG 1.5*  --   --   --   PHOS 5.1*  --   --   --    < > = values in this interval not displayed.  Liver Function Tests Recent Labs    10/31/2021 2047 11/21/21 0307  AST 21 16  ALT 16 13  ALKPHOS 135* 111  BILITOT 3.7* 3.0*  PROT 8.5* 6.9  ALBUMIN 3.7 3.0*   No results for input(s): "LIPASE", "AMYLASE" in the last 72 hours. Cardiac Enzymes Recent Labs    11/21/21 1129  CKTOTAL 62    BNP: BNP (last 3 results) Recent Labs    09/10/21 1614 11/11/21 1620 10/24/2021 1510  BNP 916.7* 1,063.1* 1,014.0*    ProBNP (last 3 results) No results for input(s): "PROBNP" in the last 8760 hours.   D-Dimer No results for input(s): "DDIMER" in the last 72 hours. Hemoglobin A1C Recent Labs    11/07/2021 2305  HGBA1C 5.5   Fasting Lipid Panel No results for input(s): "CHOL", "HDL", "LDLCALC", "TRIG", "CHOLHDL", "LDLDIRECT" in the last 72 hours. Thyroid Function Tests No results for input(s): "TSH", "T4TOTAL", "T3FREE", "THYROIDAB" in the last 72 hours.  Invalid input(s): "FREET3"  Other results:   Imaging    VAS [place] LOWER EXTREMITY VENOUS (DVT)  Result Date: 11/21/2021  Lower Venous DVT Study Patient Name:  Shannon [name]  Date of Exam:   11/21/2021 Medical Rec #: [id number]         Accession #:     [id number] Date of Birth: 07-Nov-1965        Patient Gender: F Patient Age:   22 years Exam Location:  [facility] Procedure:      VAS [place] LOWER EXTREMITY VENOUS (DVT) Referring Phys: Jenny Reichmann PAYNE --------------------------------------------------------------------------------  Indications: Edema. Renal failure. Pulmonary hypertension. Other Indications: Acute hypoxic and hypercarbic respiratory failure due to                    decompensated right heart failure and acute decompensated                    diastolic left ventricular failure. Risk Factors: Pulmonary hypertension. Limitations: Body habitus and significant edema. Comparison Study: No prior study Performing Technologist: Sharion Dove RVS  Examination Guidelines: A complete evaluation includes B-mode imaging, spectral Doppler, color Doppler, and power Doppler as needed of all accessible portions of each vessel. Bilateral testing is considered an integral part of a complete examination. Limited examinations for reoccurring indications may be performed as noted. The reflux portion of the exam is performed with the patient in reverse Trendelenburg.  +---------+---------------+---------+-----------+---------------+--------------+ RIGHT    CompressibilityPhasicitySpontaneityProperties     Thrombus Aging +---------+---------------+---------+-----------+---------------+--------------+ CFV      Full                               pulsatile                                                                 waveforms                     +---------+---------------+---------+-----------+---------------+--------------+ FV Prox                                     pulsatile      patent by  waveforms      color and                                                                 Doppler        +---------+---------------+---------+-----------+---------------+--------------+ FV Mid                                       pulsatile      patent by                                                  waveforms      color and                                                                 Doppler        +---------+---------------+---------+-----------+---------------+--------------+ FV Distal                                   pulsatile      patent by                                                  waveforms      color and                                                                 Doppler        +---------+---------------+---------+-----------+---------------+--------------+ PFV                                                        Not well                                                                  visualized     +---------+---------------+---------+-----------+---------------+--------------+ POP  Not well                                                                  visualized     +---------+---------------+---------+-----------+---------------+--------------+ PTV      Full                                                             +---------+---------------+---------+-----------+---------------+--------------+ PERO     Full                                                             +---------+---------------+---------+-----------+---------------+--------------+  +---------+---------------+---------+-----------+---------------+--------------+ LEFT     CompressibilityPhasicitySpontaneityProperties     Thrombus Aging +---------+---------------+---------+-----------+---------------+--------------+ CFV                                         pulsatile      patent by                                                  waveforms      color and                                                                 Doppler         +---------+---------------+---------+-----------+---------------+--------------+ FV Prox                                     pulsatile      patent by                                                  waveforms      color and                                                                 Doppler        +---------+---------------+---------+-----------+---------------+--------------+ FV Mid  pulsatile      patent by                                                  waveforms      color and                                                                 Doppler        +---------+---------------+---------+-----------+---------------+--------------+ FV Distal                                   pulsatile      patent by                                                  waveforms      color and                                                                 Doppler        +---------+---------------+---------+-----------+---------------+--------------+ PFV                                                        Not well                                                                  visualized     +---------+---------------+---------+-----------+---------------+--------------+ POP                                         pulsatile                                                                 waveforms                     +---------+---------------+---------+-----------+---------------+--------------+ PTV  Not well                                                                  visualized     +---------+---------------+---------+-----------+---------------+--------------+ PERO                                                       Not well                                                                  visualized      +---------+---------------+---------+-----------+---------------+--------------+     *See table(s) above for measurements and observations.    Preliminary    ECHOCARDIOGRAM COMPLETE  Result Date: 11/21/2021    ECHOCARDIOGRAM REPORT   Patient Name:   SHERIDA DOBKINS Date of Exam: 11/21/2021 Medical Rec #:  [id number]        Height:       60.0 in Accession #:    [id number]       Weight:       196.9 lb Date of Birth:  1965-11-27       BSA:          1.854 m Patient Age:    50 years         BP:           113/53 mmHg Patient Gender: F                HR:           91 bpm. Exam Location:  Inpatient Procedure: 2D Echo Indications:    congestive heart failure  History:        Patient has prior history of Echocardiogram examinations, most                 recent 03/05/2021. Risk Factors:Hypertension, Diabetes and                 Dyslipidemia.  Sonographer:    [place] Referring Phys: [id number] [place]  1. Left ventricular ejection fraction, by estimation, is 60 to 65%. The left ventricle has normal function. The left ventricle has no regional wall motion abnormalities. Left ventricular diastolic parameters were normal. There is the interventricular septum is flattened in systole and diastole, consistent with right ventricular pressure and volume overload.  2. Right ventricular systolic function is moderately reduced. The right ventricular size is moderately enlarged. There is moderately elevated pulmonary artery systolic pressure. The estimated right ventricular systolic pressure is 83.4 mmHg.  3. Right atrial size was severely dilated.  4. The mitral valve is normal in structure. No evidence of mitral valve regurgitation.  5. Tricuspid valve regurgitation is moderate to severe.  6. The aortic valve is tricuspid. There is mild calcification of the aortic valve. Aortic valve regurgitation is not visualized. Aortic valve sclerosis is present, with no evidence of  aortic valve stenosis.  7. The  inferior vena cava is dilated in size with <50% respiratory variability, suggesting right atrial pressure of 15 mmHg. Comparison(s): No significant change from prior study. Prior images reviewed side by side. FINDINGS  Left Ventricle: Left ventricular ejection fraction, by estimation, is 60 to 65%. The left ventricle has normal function. The left ventricle has no regional wall motion abnormalities. The left ventricular internal cavity size was normal in size. There is  no left ventricular hypertrophy. The interventricular septum is flattened in systole and diastole, consistent with right ventricular pressure and volume overload. Left ventricular diastolic parameters were normal. Normal left ventricular filling pressure. Right Ventricle: The right ventricular size is moderately enlarged. No increase in right ventricular wall thickness. Right ventricular systolic function is moderately reduced. There is moderately elevated pulmonary artery systolic pressure. The tricuspid  regurgitant velocity is 3.31 m/s, and with an assumed right atrial pressure of 15 mmHg, the estimated right ventricular systolic pressure is 58.0 mmHg. Left Atrium: Left atrial size was normal in size. Right Atrium: Right atrial size was severely dilated. Pericardium: There is no evidence of pericardial effusion. Mitral Valve: The mitral valve is normal in structure. No evidence of mitral valve regurgitation. Tricuspid Valve: The tricuspid valve is grossly normal. Tricuspid valve regurgitation is moderate to severe. Aortic Valve: The aortic valve is tricuspid. There is mild calcification of the aortic valve. Aortic valve regurgitation is not visualized. Aortic valve sclerosis is present, with no evidence of aortic valve stenosis. Pulmonic Valve: The pulmonic valve was grossly normal. Pulmonic valve regurgitation is mild. Aorta: The aortic root and ascending aorta are structurally normal, with no evidence of dilitation. Venous: The inferior vena cava  is dilated in size with less than 50% respiratory variability, suggesting right atrial pressure of 15 mmHg. IAS/Shunts: There is left bowing of the interatrial septum, suggestive of elevated right atrial pressure. No atrial level shunt detected by color flow Doppler. Additional Comments: There is pleural effusion in the left lateral region.  LEFT VENTRICLE PLAX 2D LVIDd:         3.60 cm   Diastology LVIDs:         2.20 cm   LV e' medial:    7.40 cm/s LV PW:         0.90 cm   LV E/e' medial:  10.5 LV IVS:        0.90 cm   LV e' lateral:   10.10 cm/s LVOT diam:     1.90 cm   LV E/e' lateral: 7.7 LV SV:         44 LV SV Index:   24 LVOT Area:     2.84 cm  RIGHT VENTRICLE             IVC RV S prime:     10.10 cm/s  IVC diam: 2.20 cm TAPSE (M-mode): 1.3 cm LEFT ATRIUM             Index        RIGHT ATRIUM           Index LA diam:        3.50 cm 1.89 cm/m   RA Area:     18.90 cm LA Vol (A2C):   20.0 ml 10.79 ml/m  RA Volume:   54.80 ml  29.56 ml/m LA Vol (A4C):   25.2 ml 13.59 ml/m LA Biplane Vol: 24.0 ml 12.94 ml/m  AORTIC VALVE LVOT Vmax:   89.40 cm/s LVOT Vmean:  59.800 cm/s LVOT VTI:    0.155 m  AORTA Ao Root diam: 2.40 cm Ao Asc diam:  2.80 cm MITRAL VALVE               TRICUSPID VALVE MV Area (PHT): 4.15 cm    TR Peak grad:   43.8 mmHg MV Decel Time: 183 msec    TR Vmax:        331.00 cm/s MV E velocity: 77.60 cm/s MV A velocity: 66.40 cm/s  SHUNTS MV E/A ratio:  1.17        Systemic VTI:  0.16 m                            Systemic Diam: 1.90 cm Mihai Croitoru MD Electronically signed by Sanda Klein MD Signature Date/Time: 11/21/2021/12:46:52 PM    Final      Medications:     Scheduled Medications:  Chlorhexidine Gluconate Cloth  6 each Topical Daily   heparin  5,000 Units Subcutaneous Q8H   insulin aspart  0-6 Units Subcutaneous Q4H   macitentan  10 mg Oral Daily   metolazone  5 mg Oral BID   midodrine  5 mg Oral TID WC   mouth rinse  15 mL Mouth Rinse 4 times per day   sildenafil  20 mg  Oral TID    Infusions:  sodium chloride     azithromycin Stopped (11/21/21 2228)   cefTRIAXone (ROCEPHIN)  IV Stopped (11/21/21 2157)   DOBUTamine 2.5 mcg/kg/min (11/22/21 0600)   furosemide (LASIX) 200 mg in dextrose 5 % 100 mL (2 mg/mL) infusion 20 mg/hr (11/22/21 0600)    PRN Medications: Place/Maintain arterial line **AND** sodium chloride, docusate sodium, mouth rinse, polyethylene glycol    Assessment/Plan   1. Pulmonary hypertension/RV failure: Echo in 11/22 showed EF 60-65%, mild LVH, severe RV enlargement with severely decreased RV systolic function, PASP 66 mmHg, severe RAE, moderate TR, moderate PR, small pericardial effusion. CTA chest showed no PE, normal lung fields and V/Q scan showed no evidence for chronic PE.  PFTs showed severe restriction and decreased DLCO.  RHC/LHC showed severe pulmonary arterial HTN with RV failure.  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  No history of liver disease.  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  ANA, SCL-70, anti-centromere antibody, and RF negative with elevated CRP. She may have connective tissue disease-related PAH though she saw rheumatology and no definite diagnosis was given.  High resolution CT chest showed no evidence for ILD.  She was admitted this time with worsening RV failure and AKI. Echo this admission with EF 60-65%, D-shaped septum, moderate RV enlargement/moderate RV dysfunction, PASP 59, mod-severe TR, IVC dilated. CVP is 28 this morning, marked volume overload on exam.  She was started on dobutamine 2.5 for RV support, co-ox 73% this morning. She has had some urine output but not marked, creatinine lower at 2.9.  SBP 90s-100s.   - Continue dobutamine 2.5 for RV support.  - Will continue to try to aggressively diurese today, increase Lasix to 20 mg/hr and will give metolazone 5 mg po bid today. If we are unable to effectively  diurese due to renal failure, she will need a trial of CVVH (discussed with Dr. Moshe Cipro and Dr. Tamala Julian). No CVVH yet as she is making some urine and creatinine is trending down.  - Continue  Opsumit 10 mg daily.  - Transitioned tadalafil to sildenafil with AKI, will use 20 mg tid.  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 She does not report myalgias or muscle pain.  Myositis panel was negative.  She saw rheumatology and no definite diagnosis was given. CK normal at 62 this admission.  3. Acute on chronic hypoxemic respiratory failure: On 2L home oxygen at baseline, currently on HFNC (initially on Bipap).  4. AKI on CKD stage 3: Creatinine had been slowly rising at home with diuresis, suspect cardiorenal syndrome.  However, up to 3.3 at admission with marked hyperkalemia. No arrhythmias. K now normal, creatinine trending down 2.92 today.   - Would likely tolerate iHD poorly with severe RV failure.  If does not respond to aggressive diuretics and inotrope, would give trial of CVVH to see if we could decongest her then titrate off. Nephrology following.  5. ID: CT chest with LLL infiltrate.  - Ceftriaxone/azithromycin.   CRITICAL CARE Performed by: Loralie Champagne  Total critical care time: 35 minutes  Critical care time was exclusive of separately billable procedures and treating other patients.  Critical care was necessary to treat or prevent imminent or life-threatening deterioration.  Critical care was time spent personally by me on the following activities: development of treatment plan with patient and/or surrogate as well as nursing, discussions with consultants, evaluation of patient's response to treatment, examination of patient, obtaining history from patient or surrogate, ordering and performing treatments and interventions,  ordering and review of laboratory studies, ordering and review of radiographic studies, pulse oximetry and re-evaluation of patient's condition.   Length of Stay: 2  Loralie Champagne, MD  11/22/2021, 7:47 AM  Advanced Heart Failure Team Pager [phone] (M-F; 7a - 5p)  Please contact [facility] for night-coverage after hours (5p -7a ) and weekends on [email or web address]

## 2021-11-22 NOTE — Progress Notes (Signed)
NAME:  Shannon [name], MRN:  [id number], DOB:  August 18, 1965, LOS: 2 ADMISSION DATE:  11/04/2021, CONSULTATION DATE:  7/28 REFERRING MD:  Dr. Regenia Skeeter, CHIEF COMPLAINT:  chf exacerbation   History of Present Illness:  Patient is a 56 year old female with pertinent PMH HTN, DMT2, severe pulmonary HTN followed by Shannon [name] presents to [facility] ED on 7/28 with SOB.  Patient has been experiencing weakness for the past several days and having trouble walking without getting SOB.  Today on 7/28 patient was more confused per family member.  Also was having trouble breathing.  Patient wears 2L [place] at home.  EMS called and transported patient to [facility] ED.  Patient sats low in the 80s.  Upon arrival to [facility] ED on 7/28, patient sats improved on 6L [place].  Hemodynamically stable.  Patient Aox4. CXR showing LLL atelectasis. CT chest showing LLL consolidation. Azithromycin/rocephin given. Troponin wnl. BNP 1,014. ABG initially 7.34, 45, 63, 25. Covid/flu negative. Na 132, K 7.5, creat 3.3, bun 51, ammonia 43. Patient began to have worsening resp distress and was placed on bipap. Given calcium, insulin/dextrose, albuterol, lasix. Patient mental status worsening on bipap. Repeat abg 7.25, 58, 71, 25. Patient arousable but remains lethargic. PCCM consulted.  Pertinent  Medical History   Past Medical History:  Diagnosis Date   CHF (congestive heart failure) ([place])    Diabetes ([place])    Hypertension    Inflammatory myopathy    treated 2012   Pulmonary hypertension ([place])    Wears glasses    reading     Significant Hospital Events: Including procedures, antibiotic start and stop dates in addition to other pertinent events   7/28 admitted [facility] resp failure on bipap  Interim History / Subjective:  Making some urine. On heated high flow. Hungry.  Objective   Blood pressure (!) 89/59, pulse 98, temperature (!) 97.3 F (36.3 C), temperature source Oral, resp. rate 16, height 5' (1.524 m), weight 89.3 kg, SpO2 91 %. CVP:   [13 mmHg-30 mmHg] 27 mmHg  FiO2 (%):  [45 %] 45 %   Intake/Output Summary (Last 24 hours) at 11/22/2021 0822 Last data filed at 11/22/2021 0800 Gross per 24 hour  Intake 769.77 ml  Output 1410 ml  Net -640.23 ml    Filed Weights   10/26/2021 1454 11/21/21 0341 11/22/21 0415  Weight: 89.4 kg 89.3 kg 89.3 kg    Examination: On HFNC Follows commands Crackles bilaterally JVP up Marked edema Ext warm +systolic murmur on auscultation  SvO2 72.5 on dobutamine 2.5 Off pressors Sats low 90s on 30L 45%  Resolved Hospital Problem list     Assessment & Plan:  Acute on chronic hypoxemic and hypercarbic respiratory failure due to decompensated right heart failure and possible LLL CAP.  High risk for progression to intubation.  Remains intermittently BIPAP dependent.  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 restrictive lung disease, OSA follow by heart failure clinic  - Diuretic/inotrope titration per advanced heart failure team - Target pulmonary vasodilators as ordered - Titrate O2 for sats >90%, BIPAP for increased WOB - Rocephin/azithromycin for CAP - Patient updated at bedside - Renal following for potential need for CRRT  37 min cc time [place] (right click and "Reselect all SmartList Selections" daily)   Diet/type: NPO DVT prophylaxis: prophylactic heparin  GI prophylaxis: N/A Lines: N/A Foley:  N/A Code Status:  full code Last date of multidisciplinary  goals of care discussion [7/28 spoke with sister and updated at bedside; would like [place] to remain full code.]  35 min cc time

## 2021-11-22 NOTE — Progress Notes (Signed)
Subjective:  fairly stable overnight-  BP marginal on dobutamine-  had 1300 of urine -  only 600 negative-  CVP is reading lowe but not sure that is accurate-  BUN and crt down-  she is on high flow [place] and much more alert-  c/o thirst and pain at aline site  Objective Vital signs in last 24 hours: Vitals:   11/22/21 0615 11/22/21 0630 11/22/21 0645 11/22/21 0700  BP: 103/69 101/66 102/69 98/65  Pulse: (!) 103 (!) 103 (!) 101 98  Resp: _0 Temp:      TempSrc:      SpO2: 93% 92% 92% 93%  Weight:      Height:       Weight change: -0.059 kg  Intake/Output Summary (Last 24 hours) at 11/22/2021 0713 Last data filed at 11/22/2021 0600 Gross per 24 hour  Intake 754.42 ml  Output 1335 ml  Net -580.58 ml    Assessment/Plan:56 year old BF with severe pulmonary HTN and chronic volume overload-  presents with A on CRF and hyperkalemia in the setting of what appears to be decompensated right heart failure 1.Renal- CKD as an OP which has been worsening over the last few months in the setting of escalating diuresis.  Renal ultrasound shows a  ? small left kidney but no other issues, urine is bland.  This all appears to be a cardiorenal phenomenon.   Her potassium has come down with just holding her repletion but the main issue is volume overload.  I have discussed with Dr. [name].  The plan was for aggressive attempt at diuresis but probably not likely to be successful.  Since pt is so young would be willing to offer a trial of CRRT to normalize volume and see if her kidneys can recover.  If they do not this is a difficult situation as intermittent dialysis in the setting of severe pulmonary HTN and right heart failure is problematic-  the patients really cannot tolerate.  Have made some progress the last 24 hours but not a robust diuresis by any means but she is clinically improved.  I would be OK with continuing things as is since she is improved-  if kidneys are better they will likely respond to  diuretics more.  However, still having a low threshold for CRRT  2. Hyperkalemia-  due to AKI and high dose potassium repletion.  She has had hyperkalemia for several hours while here in the ER and has not had any arythmia or hemodynamic instability-  because dialysis is not tolerated in these types of patients and she was on 100 meq of potassium daily -  holding supps and treat medically -   is now WNL with medical treatment alone 3. Hypertension/volume  - clearly volume overloaded but very difficult to diurese also in patients with right sided heart failure-  per CCM and cards- plan is for lasix drip/metolazone with trial of CRRT on standby if not successful  4. Pulmonary-  possible PNA/atelectasis-  ABG showing resp acidosis-  now on bipap with improvement-  per CCM-  on recephin and azithro  5. Anemia  - not a major issue at this time     Louis Meckel    Labs: Basic Metabolic Panel: Recent Labs  Lab 11/21/21 0307 11/21/21 0518 11/21/21 0610 11/21/21 1129 11/22/21 0316  NA 137   < > 138 136 135  K 6.6*   < > 5.8* 5.5* 4.6  CL 98  --   --  101 93*  CO2 25  --   --  26 26  GLUCOSE 119*  --   --  60* 112*  BUN 51*  --   --  52* 49*  CREATININE 3.19*  --   --  3.13* 2.92*  CALCIUM 9.6  --   --  9.4 9.3  PHOS 5.1*  --   --   --   --    < > = values in this interval not displayed.   Liver Function Tests: Recent Labs  Lab 11/10/2021 1733 11/08/2021 2047 11/21/21 0307  AST _0 ALT _1 ALKPHOS 128* 135* 111  BILITOT 3.9* 3.7* 3.0*  PROT 8.2* 8.5* 6.9  ALBUMIN 3.6 3.7 3.0*   No results for input(s): "LIPASE", "AMYLASE" in the last 168 hours. Recent Labs  Lab 11/01/2021 2047 11/21/21 0346  AMMONIA 43* 41*   CBC: Recent Labs  Lab 11/19/2021 1450 11/06/2021 1530 11/03/2021 2305 11/21/21 0100 11/21/21 0307 11/21/21 0518 11/21/21 0610  WBC 7.2  --  7.8  --  6.9  --   --   NEUTROABS 5.0  --   --   --   --   --   --   HGB 11.9*   < > 11.8*   < > 10.9* 12.6  12.6  HCT 35.1*   < > 34.1*   < > 31.6* 37.0 37.0  MCV 82.0  --  82.4  --  81.4  --   --   PLT 317  --  249  --  228  --   --    < > = values in this interval not displayed.   Cardiac Enzymes: Recent Labs  Lab 11/21/21 1129  CKTOTAL 62   CBG: Recent Labs  Lab 11/21/21 1226 11/21/21 1616 11/21/21 2018 11/21/21 2305 11/22/21 0324  GLUCAP 92 77 137* 186* 120*    Iron Studies: No results for input(s): "IRON", "TIBC", "TRANSFERRIN", "FERRITIN" in the last 72 hours. Studies/Results: VAS [place] LOWER EXTREMITY VENOUS (DVT)  Result Date: 11/21/2021  Lower Venous DVT Study Patient Name:  Shannon [name]  Date of Exam:   11/21/2021 Medical Rec #: [id number]         Accession #:    [id number] Date of Birth: Jul 03, 1965        Patient Gender: F Patient Age:   17 years Exam Location:  [facility] Procedure:      VAS [place] LOWER EXTREMITY VENOUS (DVT) Referring Phys: Jenny Reichmann PAYNE --------------------------------------------------------------------------------  Indications: Edema. Renal failure. Pulmonary hypertension. Other Indications: Acute hypoxic and hypercarbic respiratory failure due to                    decompensated right heart failure and acute decompensated                    diastolic left ventricular failure. Risk Factors: Pulmonary hypertension. Limitations: Body habitus and significant edema. Comparison Study: No prior study Performing Technologist: Sharion Dove RVS  Examination Guidelines: A complete evaluation includes B-mode imaging, spectral Doppler, color Doppler, and power Doppler as needed of all accessible portions of each vessel. Bilateral testing is considered an integral part of a complete examination. Limited examinations for reoccurring indications may be performed as noted. The reflux portion of the exam is performed with the patient in reverse Trendelenburg.  +---------+---------------+---------+-----------+---------------+--------------+ RIGHT     CompressibilityPhasicitySpontaneityProperties     Thrombus Aging +---------+---------------+---------+-----------+---------------+--------------+ CFV  Full                               pulsatile                                                                 waveforms                     +---------+---------------+---------+-----------+---------------+--------------+ FV Prox                                     pulsatile      patent by                                                  waveforms      color and                                                                 Doppler        +---------+---------------+---------+-----------+---------------+--------------+ FV Mid                                      pulsatile      patent by                                                  waveforms      color and                                                                 Doppler        +---------+---------------+---------+-----------+---------------+--------------+ FV Distal                                   pulsatile      patent by                                                  waveforms      color and  Doppler        +---------+---------------+---------+-----------+---------------+--------------+ PFV                                                        Not well                                                                  visualized     +---------+---------------+---------+-----------+---------------+--------------+ POP                                                        Not well                                                                  visualized     +---------+---------------+---------+-----------+---------------+--------------+ PTV      Full                                                              +---------+---------------+---------+-----------+---------------+--------------+ PERO     Full                                                             +---------+---------------+---------+-----------+---------------+--------------+  +---------+---------------+---------+-----------+---------------+--------------+ LEFT     CompressibilityPhasicitySpontaneityProperties     Thrombus Aging +---------+---------------+---------+-----------+---------------+--------------+ CFV                                         pulsatile      patent by                                                  waveforms      color and                                                                 Doppler        +---------+---------------+---------+-----------+---------------+--------------+ FV Prox  pulsatile      patent by                                                  waveforms      color and                                                                 Doppler        +---------+---------------+---------+-----------+---------------+--------------+ FV Mid                                      pulsatile      patent by                                                  waveforms      color and                                                                 Doppler        +---------+---------------+---------+-----------+---------------+--------------+ FV Distal                                   pulsatile      patent by                                                  waveforms      color and                                                                 Doppler        +---------+---------------+---------+-----------+---------------+--------------+ PFV                                                        Not well  visualized      +---------+---------------+---------+-----------+---------------+--------------+ POP                                         pulsatile                                                                 waveforms                     +---------+---------------+---------+-----------+---------------+--------------+ PTV                                                        Not well                                                                  visualized     +---------+---------------+---------+-----------+---------------+--------------+ PERO                                                       Not well                                                                  visualized     +---------+---------------+---------+-----------+---------------+--------------+     *See table(s) above for measurements and observations.    Preliminary    ECHOCARDIOGRAM COMPLETE  Result Date: 11/21/2021    ECHOCARDIOGRAM REPORT   Patient Name:   Shannon [name] Date of Exam: 11/21/2021 Medical Rec #:  [id number]        Height:       60.0 in Accession #:    [id number]       Weight:       196.9 lb Date of Birth:  March 28, 1966       BSA:          1.854 m Patient Age:    19 years         BP:           113/53 mmHg Patient Gender: F                HR:           91 bpm. Exam Location:  Inpatient Procedure: 2D Echo Indications:    congestive heart failure  History:        Patient has prior history of Echocardiogram examinations, most  recent 03/05/2021. Risk Factors:Hypertension, Diabetes and                 Dyslipidemia.  Sonographer:    [place] Referring Phys: [id number] [place]  1. Left ventricular ejection fraction, by estimation, is 60 to 65%. The left ventricle has normal function. The left ventricle has no regional wall motion abnormalities. Left ventricular diastolic parameters were normal. There is the interventricular septum is flattened in systole  and diastole, consistent with right ventricular pressure and volume overload.  2. Right ventricular systolic function is moderately reduced. The right ventricular size is moderately enlarged. There is moderately elevated pulmonary artery systolic pressure. The estimated right ventricular systolic pressure is 73.4 mmHg.  3. Right atrial size was severely dilated.  4. The mitral valve is normal in structure. No evidence of mitral valve regurgitation.  5. Tricuspid valve regurgitation is moderate to severe.  6. The aortic valve is tricuspid. There is mild calcification of the aortic valve. Aortic valve regurgitation is not visualized. Aortic valve sclerosis is present, with no evidence of aortic valve stenosis.  7. The inferior vena cava is dilated in size with <50% respiratory variability, suggesting right atrial pressure of 15 mmHg. Comparison(s): No significant change from prior study. Prior images reviewed side by side. FINDINGS  Left Ventricle: Left ventricular ejection fraction, by estimation, is 60 to 65%. The left ventricle has normal function. The left ventricle has no regional wall motion abnormalities. The left ventricular internal cavity size was normal in size. There is  no left ventricular hypertrophy. The interventricular septum is flattened in systole and diastole, consistent with right ventricular pressure and volume overload. Left ventricular diastolic parameters were normal. Normal left ventricular filling pressure. Right Ventricle: The right ventricular size is moderately enlarged. No increase in right ventricular wall thickness. Right ventricular systolic function is moderately reduced. There is moderately elevated pulmonary artery systolic pressure. The tricuspid  regurgitant velocity is 3.31 m/s, and with an assumed right atrial pressure of 15 mmHg, the estimated right ventricular systolic pressure is 19.3 mmHg. Left Atrium: Left atrial size was normal in size. Right Atrium: Right atrial size was  severely dilated. Pericardium: There is no evidence of pericardial effusion. Mitral Valve: The mitral valve is normal in structure. No evidence of mitral valve regurgitation. Tricuspid Valve: The tricuspid valve is grossly normal. Tricuspid valve regurgitation is moderate to severe. Aortic Valve: The aortic valve is tricuspid. There is mild calcification of the aortic valve. Aortic valve regurgitation is not visualized. Aortic valve sclerosis is present, with no evidence of aortic valve stenosis. Pulmonic Valve: The pulmonic valve was grossly normal. Pulmonic valve regurgitation is mild. Aorta: The aortic root and ascending aorta are structurally normal, with no evidence of dilitation. Venous: The inferior vena cava is dilated in size with less than 50% respiratory variability, suggesting right atrial pressure of 15 mmHg. IAS/Shunts: There is left bowing of the interatrial septum, suggestive of elevated right atrial pressure. No atrial level shunt detected by color flow Doppler. Additional Comments: There is pleural effusion in the left lateral region.  LEFT VENTRICLE PLAX 2D LVIDd:         3.60 cm   Diastology LVIDs:         2.20 cm   LV e' medial:    7.40 cm/s LV PW:         0.90 cm   LV E/e' medial:  10.5 LV IVS:        0.90 cm  LV e' lateral:   10.10 cm/s LVOT diam:     1.90 cm   LV E/e' lateral: 7.7 LV SV:         44 LV SV Index:   24 LVOT Area:     2.84 cm  RIGHT VENTRICLE             IVC RV S prime:     10.10 cm/s  IVC diam: 2.20 cm TAPSE (M-mode): 1.3 cm LEFT ATRIUM             Index        RIGHT ATRIUM           Index LA diam:        3.50 cm 1.89 cm/m   RA Area:     18.90 cm LA Vol (A2C):   20.0 ml 10.79 ml/m  RA Volume:   54.80 ml  29.56 ml/m LA Vol (A4C):   25.2 ml 13.59 ml/m LA Biplane Vol: 24.0 ml 12.94 ml/m  AORTIC VALVE LVOT Vmax:   89.40 cm/s LVOT Vmean:  59.800 cm/s LVOT VTI:    0.155 m  AORTA Ao Root diam: 2.40 cm Ao Asc diam:  2.80 cm MITRAL VALVE               TRICUSPID VALVE MV Area  (PHT): 4.15 cm    TR Peak grad:   43.8 mmHg MV Decel Time: 183 msec    TR Vmax:        331.00 cm/s MV E velocity: 77.60 cm/s MV A velocity: 66.40 cm/s  SHUNTS MV E/A ratio:  1.17        Systemic VTI:  0.16 m                            Systemic Diam: 1.90 cm Dani Gobble Croitoru MD Electronically signed by Sanda Klein MD Signature Date/Time: 11/21/2021/12:46:52 PM    Final    DG Chest Port 1 View  Result Date: 11/21/2021 CLINICAL DATA:  56 year old female with respiratory failure. EXAM: PORTABLE CHEST 1 VIEW COMPARISON:  Portable chest 0158 hours today.  Chest CT yesterday. FINDINGS: Portable AP upright view at 0649 hours. Ongoing very low lung volumes. Stable left subclavian central line. Stable cardiac size and mediastinal contours. Lung base hypo ventilation appears greater on the left. No pneumothorax or pulmonary edema. Paucity of bowel gas in the upper abdomen. No acute osseous abnormality identified. IMPRESSION: 1. Ongoing low lung volumes. Small pleural effusions and left lower lobe collapse or consolidation better demonstrated by CT yesterday. 2. No new cardiopulmonary abnormality. Electronically Signed   By: Genevie Ann M.D.   On: 11/21/2021 07:20   DG CHEST PORT 1 VIEW  Result Date: 11/21/2021 CLINICAL DATA:  Central line placement. EXAM: PORTABLE CHEST 1 VIEW COMPARISON:  Chest radiograph dated 10/28/2021 and CT dated 11/21/2021. FINDINGS: Left subclavian central venous line with tip in the region of the cavoatrial junction. Shallow inspiration with left lung base atelectasis or infiltrate. No pneumothorax. Stable cardiac silhouette. No acute osseous pathology. IMPRESSION: Left subclavian central venous line with tip in the region of the cavoatrial junction. No pneumothorax. Electronically Signed   By: Anner Crete M.D.   On: 11/21/2021 02:08   US RENAL  Result Date: 11/23/2021 CLINICAL DATA:  Acute renal injury EXAM: RENAL / URINARY TRACT ULTRASOUND COMPLETE COMPARISON:  None Available.  FINDINGS: Right Kidney: Renal measurements: 8.5 x 4.7 x 5.9 cm. = volume: 124  mL. Echogenicity within normal limits. No mass or hydronephrosis visualized. Left Kidney: Renal measurements: 6.6 x 3.6 x 4.0 cm. = volume: 50 mL. Left kidney is incompletely evaluated due to poor acoustical window. Bladder: Appears normal for degree of bladder distention. Other: Ascites is noted. IMPRESSION: No obstructive changes are noted. Left kidney is not well visualized due to poor acoustical window. Diffuse abdominal ascites is seen. Electronically Signed   By: Inez Catalina M.D.   On: 10/31/2021 23:44   CT CHEST WO CONTRAST  Result Date: 11/02/2021 CLINICAL DATA:  Respiratory distress. Concern for pulmonary embolism. EXAM: CT CHEST WITHOUT CONTRAST TECHNIQUE: Multidetector CT imaging of the chest was performed following the standard protocol without IV contrast. RADIATION DOSE REDUCTION: This exam was performed according to the departmental dose-optimization program which includes automated exposure control, adjustment of the mA and/or kV according to patient size and/or use of iterative reconstruction technique. COMPARISON:  Chest CT dated 07/06/2021 and radiograph dated 11/17/2021. FINDINGS: Evaluation of this exam is limited due to respiratory motion as well as in the absence of intravenous contrast. Cardiovascular: Borderline cardiomegaly. Trace pericardial effusion. Mild atherosclerotic calcification of the aortic arch. No aneurysmal dilatation. Mild dilatation of the pulmonary trunk which may suggest pulmonary hypertension. Mediastinum/Nodes: No hilar or mediastinal adenopathy. The esophagus is grossly unremarkable. No mediastinal fluid collection. Lungs/Pleura: There is partial consolidation of the left lower lobe with air bronchogram which may represent atelectasis or infiltrate. Underlying mass is not excluded. Trace left pleural effusion. Minimal right lung base atelectasis. Overall there is shallow inspiration. No  pneumothorax. The central airways are patent. Upper Abdomen: Partially visualized ascites. Musculoskeletal: Diffuse subcutaneous edema and anasarca. Osteopenia with degenerative changes of the spine. Probable old sternal fracture. No acute osseous pathology. IMPRESSION: 1. Low lung volumes with partial consolidation of the left lower lobe with air bronchogram which may represent atelectasis or infiltrate. 2. Trace left pleural effusion. 3. Partially visualized ascites and anasarca. 4. Aortic Atherosclerosis (ICD10-I70.0). Electronically Signed   By: Anner Crete M.D.   On: 11/19/2021 21:20   DG Chest Portable 1 View  Result Date: 11/11/2021 CLINICAL DATA:  Worsening dyspnea EXAM: PORTABLE CHEST 1 VIEW COMPARISON:  Film from earlier in the same day. FINDINGS: Cardiac shadow is prominent but accentuated by the portable technique. The overall inspiratory effort is again poor with left basilar atelectatic changes. Vascular crowding is noted centrally. No acute bony abnormality is seen. IMPRESSION: Persistent left basilar atelectasis. No new focal abnormality is seen. Electronically Signed   By: Inez Catalina M.D.   On: 11/21/2021 19:38   DG Chest Portable 1 View  Result Date: 10/27/2021 CLINICAL DATA:  Dyspnea. EXAM: PORTABLE CHEST 1 VIEW COMPARISON:  03/20/2021 FINDINGS: Heart size is within normal limits allowing for very low lung volumes. Opacity at the left lung base may be due to atelectasis or infiltrate. Right lung appears clear. No evidence of pleural effusion. IMPRESSION: Very low lung volumes. Left basilar atelectasis versus infiltrate. Electronically Signed   By: Marlaine Hind M.D.   On: 11/09/2021 15:28   Medications: Infusions:  sodium chloride     azithromycin Stopped (11/21/21 2228)   cefTRIAXone (ROCEPHIN)  IV Stopped (11/21/21 2157)   DOBUTamine 2.5 mcg/kg/min (11/22/21 0600)   furosemide (LASIX) 200 mg in dextrose 5 % 100 mL (2 mg/mL) infusion 20 mg/hr (11/22/21 0600)    Scheduled  Medications:  Chlorhexidine Gluconate Cloth  6 each Topical Daily   heparin  5,000 Units Subcutaneous Q8H   insulin aspart  0-6 Units Subcutaneous Q4H   macitentan  10 mg Oral Daily   mouth rinse  15 mL Mouth Rinse 4 times per day   sildenafil  20 mg Oral TID    have reviewed scheduled and prn medications.  Physical Exam: General:  more alert-  on HFNC Heart:tachy Lungs: CBS bilat Abdomen: non tender Extremities: pitting edema Dialysis Access: none yet     11/22/2021,7:13 AM  LOS: 2 days

## 2021-11-23 ENCOUNTER — Inpatient Hospital Stay (HOSPITAL_COMMUNITY): Payer: 59

## 2021-11-23 DIAGNOSIS — N179 Acute kidney failure, unspecified: Secondary | ICD-10-CM | POA: Diagnosis not present

## 2021-11-23 DIAGNOSIS — E875 Hyperkalemia: Secondary | ICD-10-CM | POA: Diagnosis not present

## 2021-11-23 DIAGNOSIS — I50811 Acute right heart failure: Secondary | ICD-10-CM

## 2021-11-23 DIAGNOSIS — J9601 Acute respiratory failure with hypoxia: Secondary | ICD-10-CM | POA: Diagnosis not present

## 2021-11-23 DIAGNOSIS — I50810 Right heart failure, unspecified: Secondary | ICD-10-CM | POA: Diagnosis not present

## 2021-11-23 DIAGNOSIS — L8990 Pressure ulcer of unspecified site, unspecified stage: Secondary | ICD-10-CM | POA: Insufficient documentation

## 2021-11-23 LAB — RENAL FUNCTION PANEL
Albumin: 3.3 g/dL — ABNORMAL LOW (ref 3.5–5.0)
Anion gap: 9 (ref 5–15)
BUN: 39 mg/dL — ABNORMAL HIGH (ref 6–20)
CO2: 29 mmol/L (ref 22–32)
Calcium: 8.7 mg/dL — ABNORMAL LOW (ref 8.9–10.3)
Chloride: 95 mmol/L — ABNORMAL LOW (ref 98–111)
Creatinine, Ser: 2.3 mg/dL — ABNORMAL HIGH (ref 0.44–1.00)
GFR, Estimated: 25 mL/min — ABNORMAL LOW (ref >60)
Glucose, Bld: 118 mg/dL — ABNORMAL HIGH (ref 70–99)
Phosphorus: 3.8 mg/dL (ref 2.5–4.6)
Potassium: 4.5 mmol/L (ref 3.5–5.1)
Sodium: 133 mmol/L — ABNORMAL LOW (ref 135–145)

## 2021-11-23 LAB — LEGIONELLA PNEUMOPHILA SEROGP 1 UR AG: L. pneumophila Serogp 1 Ur Ag: NEGATIVE

## 2021-11-23 LAB — CBC
HCT: 29.4 % — ABNORMAL LOW (ref 36.0–46.0)
Hemoglobin: 10.1 g/dL — ABNORMAL LOW (ref 12.0–15.0)
MCH: 28.3 pg (ref 26.0–34.0)
MCHC: 34.4 g/dL (ref 30.0–36.0)
MCV: 82.4 fL (ref 80.0–100.0)
Platelets: 205 10*3/uL (ref 150–400)
RBC: 3.57 MIL/uL — ABNORMAL LOW (ref 3.87–5.11)
RDW: 17.7 % — ABNORMAL HIGH (ref 11.5–15.5)
WBC: 6.9 10*3/uL (ref 4.0–10.5)
nRBC: 0.7 % — ABNORMAL HIGH (ref 0.0–0.2)

## 2021-11-23 LAB — COOXEMETRY PANEL
Carboxyhemoglobin: 1.9 % — ABNORMAL HIGH (ref 0.5–1.5)
Methemoglobin: 0.9 % (ref 0.0–1.5)
O2 Saturation: 72.3 %
Total hemoglobin: 10.6 g/dL — ABNORMAL LOW (ref 12.0–16.0)

## 2021-11-23 LAB — BASIC METABOLIC PANEL
Anion gap: 10 (ref 5–15)
BUN: 49 mg/dL — ABNORMAL HIGH (ref 6–20)
CO2: 28 mmol/L (ref 22–32)
Calcium: 9.2 mg/dL (ref 8.9–10.3)
Chloride: 95 mmol/L — ABNORMAL LOW (ref 98–111)
Creatinine, Ser: 2.91 mg/dL — ABNORMAL HIGH (ref 0.44–1.00)
GFR, Estimated: 19 mL/min — ABNORMAL LOW (ref >60)
Glucose, Bld: 129 mg/dL — ABNORMAL HIGH (ref 70–99)
Potassium: 4.8 mmol/L (ref 3.5–5.1)
Sodium: 133 mmol/L — ABNORMAL LOW (ref 135–145)

## 2021-11-23 LAB — GLUCOSE, CAPILLARY
Glucose-Capillary: 104 mg/dL — ABNORMAL HIGH (ref 70–99)
Glucose-Capillary: 113 mg/dL — ABNORMAL HIGH (ref 70–99)
Glucose-Capillary: 121 mg/dL — ABNORMAL HIGH (ref 70–99)
Glucose-Capillary: 157 mg/dL — ABNORMAL HIGH (ref 70–99)
Glucose-Capillary: 157 mg/dL — ABNORMAL HIGH (ref 70–99)
Glucose-Capillary: 175 mg/dL — ABNORMAL HIGH (ref 70–99)

## 2021-11-23 LAB — MAGNESIUM: Magnesium: 2 mg/dL (ref 1.7–2.4)

## 2021-11-23 MED ORDER — PRISMASOL BGK 4/2.5 32-4-2.5 MEQ/L REPLACEMENT SOLN: Status: DC

## 2021-11-23 MED ORDER — PRISMASOL BGK 4/2.5 32-4-2.5 MEQ/L EC SOLN
Status: DC
Start: 2021-11-23 — End: 2021-12-02

## 2021-11-23 MED ORDER — SODIUM CHLORIDE 0.9 % FOR CRRT: INTRAVENOUS_CENTRAL | Status: DC | PRN

## 2021-11-23 MED ORDER — WHITE PETROLATUM EX OINT: TOPICAL_OINTMENT | CUTANEOUS | Status: DC | PRN

## 2021-11-23 MED ORDER — ACETAZOLAMIDE 250 MG PO TABS
250.0000 mg | ORAL_TABLET | Freq: Once | ORAL | Status: AC
  Administered 2021-11-23: 250 mg via ORAL
  Filled 2021-11-23: qty 1

## 2021-11-23 MED ORDER — MIDODRINE HCL 5 MG PO TABS
5.0000 mg | ORAL_TABLET | Freq: Once | ORAL | Status: AC
Start: 2021-11-23 — End: 2021-11-23
  Administered 2021-11-23: 5 mg via ORAL
  Filled 2021-11-23: qty 1

## 2021-11-23 MED ORDER — MIDODRINE HCL 5 MG PO TABS
10.0000 mg | ORAL_TABLET | Freq: Three times a day (TID) | ORAL | Status: DC
Start: 2021-11-23 — End: 2021-11-24
  Administered 2021-11-23 – 2021-11-24 (×3): 10 mg via ORAL
  Filled 2021-11-23 (×3): qty 2

## 2021-11-23 MED ORDER — MIDODRINE HCL 5 MG PO TABS: 10.0000 mg | ORAL_TABLET | Freq: Three times a day (TID) | ORAL | Status: DC

## 2021-11-23 MED ORDER — HEPARIN SODIUM (PORCINE) 1000 UNIT/ML DIALYSIS
1000.0000 [IU] | INTRAMUSCULAR | Status: DC | PRN
  Administered 2021-11-23: 2400 [IU] via INTRAVENOUS_CENTRAL
  Filled 2021-11-23 (×2): qty 6

## 2021-11-23 MED ORDER — PRISMASOL BGK 4/2.5 32-4-2.5 MEQ/L REPLACEMENT SOLN
Status: DC
Start: 2021-11-23 — End: 2021-12-02

## 2021-11-23 NOTE — Procedures (Signed)
Central Venous Catheter Insertion Procedure Note  Shannon [name]  [id number]  1965/11/27  Date:11/23/21  Time:11:56 AM   Provider Performing:Jennel Mara   Procedure: Insertion of Non-tunneled Central Venous Catheter(36556)with US guidance (29603)    Indication(s) Hemodialysis  Consent Risks of the procedure as well as the alternatives and risks of each were explained to the patient and/or caregiver.  Consent for the procedure was obtained and is signed in the bedside chart  Anesthesia Topical only with 1% lidocaine   Timeout Verified patient identification, verified procedure, site/side was marked, verified correct patient position, special equipment/implants available, medications/allergies/relevant history reviewed, required imaging and test results available.  Sterile Technique Maximal sterile technique including full sterile barrier drape, hand hygiene, sterile gown, sterile gloves, mask, hair covering, sterile ultrasound probe cover (if used).  Procedure Description Area of catheter insertion was cleaned with chlorhexidine and draped in sterile fashion.   With real-time ultrasound guidance a HD catheter was placed into the right internal jugular vein.  Nonpulsatile blood flow and easy flushing noted in all ports.  The catheter was sutured in place and sterile dressing applied.  Complications/Tolerance None; patient tolerated the procedure well. Chest X-ray is ordered to verify placement for internal jugular or subclavian cannulation.  Chest x-ray is not ordered for femoral cannulation.  EBL Minimal  Specimen(s) None

## 2021-11-23 NOTE — Progress Notes (Signed)
NAME:  Shannon RODRIGES, MRN:  [id number], DOB:  [birth year], LOS: 3 ADMISSION DATE:  11/23/2021, CONSULTATION DATE:  7/28 REFERRING MD:  Dr. Regenia Skeeter, CHIEF COMPLAINT:  chf exacerbation   History of Present Illness:  Patient is a 56 year old female with pertinent PMH HTN, DMT2, severe pulmonary HTN followed by [name] presents to [facility] ED on 7/28 with SOB.  Patient has been experiencing weakness for the past several days and having trouble walking without getting SOB.  Today on 7/28 patient was more confused per family member.  Also was having trouble breathing.  Patient wears 2L [place] at home.  EMS called and transported patient to [facility] ED.  Patient sats low in the 80s.  Upon arrival to [facility] ED on 7/28, patient sats improved on 6L [place].  Hemodynamically stable.  Patient Aox4. CXR showing LLL atelectasis. CT chest showing LLL consolidation. Azithromycin/rocephin given. Troponin wnl. BNP 1,014. ABG initially 7.34, 45, 63, 25. Covid/flu negative. Na 132, K 7.5, creat 3.3, bun 51, ammonia 43. Patient began to have worsening resp distress and was placed on bipap. Given calcium, insulin/dextrose, albuterol, lasix. Patient mental status worsening on bipap. Repeat abg 7.25, 58, 71, 25. Patient arousable but remains lethargic. PCCM consulted.  Pertinent  Medical History   Past Medical History:  Diagnosis Date   CHF (congestive heart failure) ([place])    Diabetes ([place])    Hypertension    Inflammatory myopathy    treated 2012   Pulmonary hypertension ([place])    Wears glasses    reading    Significant Hospital Events: Including procedures, antibiotic start and stop dates in addition to other pertinent events   7/28 admitted [facility] resp failure on bipap  Interim History / Subjective:  Making some urine but remain in a positive On heated high flow.  Objective   Blood pressure 91/67, pulse 88, temperature 97.6 F (36.4 C), temperature source Oral, resp. rate 14, height 5' (1.524 m), weight 89.4 kg, SpO2 95  %. CVP:  [10 mmHg-36 mmHg] 31 mmHg  FiO2 (%):  [45 %-65 %] 65 %   Intake/Output Summary (Last 24 hours) at 11/23/2021 1042 Last data filed at 11/23/2021 0900 Gross per 24 hour  Intake 760.54 ml  Output 1185 ml  Net -424.46 ml   Filed Weights   11/21/21 0341 11/22/21 0415 11/23/21 0355  Weight: 89.3 kg 89.3 kg 89.4 kg    Examination: Physical exam: General: Acute onChronically ill-appearing female, lying on the bed HEENT: [place]/AT, eyes anicteric.  moist mucus membranes Neuro: Alert, awake following commands Chest: Bilateral basal crackles all over Heart: Regular rate and rhythm, no murmurs or gallops Abdomen: Soft, nontender, nondistended, bowel sounds present Skin: No rash   SvO2 72.3 on dobutamine 2.5 On high flow nasal cannula oxygen 30 L and 65% with goal  Resolved Hospital Problem list     Assessment & Plan:  Acute on chronic hypoxemic and hypercarbic respiratory failure due to decompensated right heart failure and possible LLL CAP.  High risk for progression to intubation.  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, with restrictive lung disease, OSA follow by heart failure clinic Diuretic/inotrope titration per  advanced heart failure team Target pulmonary vasodilators as ordered  Hypervolemic hyponatremia due to right-sided heart failure Closely monitor electrolytes   Best Practice (right click and "Reselect all SmartList Selections" daily)   Diet/type: Regular consistency DVT prophylaxis: prophylactic heparin  GI prophylaxis: N/A Lines: N/A Foley:  N/A Code Status:  full code Last date of  multidisciplinary goals of care discussion [7/28 spoke with sister and updated at bedside; would like [place] to remain full code.]  Total critical care time: 44 minutes  Performed by: [place] care time was exclusive of separately billable procedures and treating other patients.   Critical care was necessary to treat or prevent imminent or life-threatening deterioration.   Critical care was time spent personally by me on the following activities: development of treatment plan with patient and/or surrogate as well as nursing, discussions with consultants, evaluation of patient's response to treatment, examination of patient, obtaining history from patient or surrogate, ordering and performing treatments and interventions, ordering and review of laboratory studies, ordering and review of radiographic studies, pulse oximetry and re-evaluation of patient's condition.   Jacky Kindle, MD  Pulmonary Critical Care See Amion for pager If no response to pager, please call [phone] until 7pm After 7pm, Please call E-link [phone]

## 2021-11-23 NOTE — Progress Notes (Signed)
Patient ID: Shannon [name], female   DOB: Feb 20, 1966, 56 y.o.   MRN: [id number]     Advanced Heart Failure Rounding Note  PCP-Cardiologist: Fransico Him, MD   Subjective:    UOP 1120 yesterday on Lasix gtt + metolazone, no weight change.  Creainine 3.13 => 2.92 => 2.91.  Co-ox 72% on dobutamine 2.5.  SBP 90s.  She is on HFNC.  She had an episode of vomiting last night.  CVP 32 this morning.   Echo: EF 60-65%, D-shaped septum, moderate RV enlargement/moderate RV dysfunction, PASP 59, mod-severe TR, IVC dilated.    Objective:   Weight Range: 89.4 kg Body mass index is 38.49 kg/m.   Vital Signs:   Temp:  [97.3 F (36.3 C)-98 F (36.7 C)] 97.6 F (36.4 C) (07/31 0717) Pulse Rate:  [51-108] 96 (07/31 0717) Resp:  [0-27] 18 (07/31 0717) BP: (81-112)/(54-84) 98/60 (07/31 0717) SpO2:  [88 %-95 %] 90 % (07/31 0717) FiO2 (%):  [45 %-50 %] 50 % (07/31 0400) Weight:  [89.4 kg] 89.4 kg (07/31 0355) Last BM Date : 11/22/21  Weight change: Filed Weights   11/21/21 0341 11/22/21 0415 11/23/21 0355  Weight: 89.3 kg 89.3 kg 89.4 kg    Intake/Output:   Intake/Output Summary (Last 24 hours) at 11/23/2021 0807 Last data filed at 11/23/2021 0600 Gross per 24 hour  Intake 863.63 ml  Output 1320 ml  Net -456.37 ml      Physical Exam    General: NAD Neck: JVP 16+, no thyromegaly or thyroid nodule.  Lungs: Decreased at bases.  CV: Nondisplaced PMI.  Heart regular S1/S2, no S3/S4, no murmur.  1+ ankle edema.   Abdomen: Soft, nontender, no hepatosplenomegaly, no distention.  Skin: Intact without lesions or rashes.  Neurologic: Alert and oriented x 3.  Psych: Normal affect. Extremities: No clubbing or cyanosis.  HEENT: Normal.   Telemetry   Sinus tachy 100s (personally reviewed)  Labs    CBC Recent Labs    11/06/2021 1450 11/18/2021 1530 11/21/21 0307 11/21/21 0518 11/21/21 0610 11/23/21 0354  WBC 7.2   < > 6.9  --   --  6.9  NEUTROABS 5.0  --   --   --   --   --   HGB  11.9*   < > 10.9*   < > 12.6 10.1*  HCT 35.1*   < > 31.6*   < > 37.0 29.4*  MCV 82.0   < > 81.4  --   --  82.4  PLT 317   < > 228  --   --  205   < > = values in this interval not displayed.   Basic Metabolic Panel Recent Labs    11/21/21 0307 11/21/21 0518 11/22/21 1549 11/23/21 0354  NA 137   < > 133* 133*  K 6.6*   < > 4.4 4.8  CL 98   < > 94* 95*  CO2 25   < > 28 28  GLUCOSE 119*   < > 132* 129*  BUN 51*   < > 50* 49*  CREATININE 3.19*   < > 2.89* 2.91*  CALCIUM 9.6   < > 9.2 9.2  MG 1.5*  --   --  2.0  PHOS 5.1*  --   --   --    < > = values in this interval not displayed.   Liver Function Tests Recent Labs    11/13/2021 2047 11/21/21 0307  AST 21 16  ALT 16  13  ALKPHOS 135* 111  BILITOT 3.7* 3.0*  PROT 8.5* 6.9  ALBUMIN 3.7 3.0*   No results for input(s): "LIPASE", "AMYLASE" in the last 72 hours. Cardiac Enzymes Recent Labs    11/21/21 1129  CKTOTAL 62    BNP: BNP (last 3 results) Recent Labs    09/10/21 1614 11/11/21 1620 11/23/2021 1510  BNP 916.7* 1,063.1* 1,014.0*    ProBNP (last 3 results) No results for input(s): "PROBNP" in the last 8760 hours.   D-Dimer No results for input(s): "DDIMER" in the last 72 hours. Hemoglobin A1C Recent Labs    11/22/2021 2305  HGBA1C 5.5   Fasting Lipid Panel No results for input(s): "CHOL", "HDL", "LDLCALC", "TRIG", "CHOLHDL", "LDLDIRECT" in the last 72 hours. Thyroid Function Tests No results for input(s): "TSH", "T4TOTAL", "T3FREE", "THYROIDAB" in the last 72 hours.  Invalid input(s): "FREET3"  Other results:   Imaging    No results found.   Medications:     Scheduled Medications:  Chlorhexidine Gluconate Cloth  6 each Topical Daily   heparin  5,000 Units Subcutaneous Q8H   insulin aspart  0-6 Units Subcutaneous Q4H   macitentan  10 mg Oral Daily   metolazone  5 mg Oral BID   midodrine  10 mg Oral TID WC   mouth rinse  15 mL Mouth Rinse 4 times per day   sildenafil  20 mg Oral TID     Infusions:  sodium chloride     azithromycin Stopped (11/22/21 2205)   cefTRIAXone (ROCEPHIN)  IV Stopped (11/22/21 2135)   DOBUTamine 2.5 mcg/kg/min (11/23/21 0600)   furosemide (LASIX) 200 mg in dextrose 5 % 100 mL (2 mg/mL) infusion 20 mg/hr (11/23/21 0600)    PRN Medications: Place/Maintain arterial line **AND** sodium chloride, docusate sodium, ondansetron (ZOFRAN) IV, mouth rinse, polyethylene glycol, simethicone    Assessment/Plan   1. Pulmonary hypertension/RV failure: Echo in 11/22 showed EF 60-65%, mild LVH, severe RV enlargement with severely decreased RV systolic function, PASP 66 mmHg, severe RAE, moderate TR, moderate PR, small pericardial effusion. CTA chest showed no PE, normal lung fields and V/Q scan showed no evidence for chronic PE.  PFTs showed severe restriction and decreased DLCO.  RHC/LHC showed severe pulmonary arterial HTN with RV failure.  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  No history of liver disease.  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  ANA, SCL-70, anti-centromere antibody, and RF negative with elevated CRP. She may have connective tissue disease-related PAH though she saw rheumatology and no definite diagnosis was given.  High resolution CT chest showed no evidence for ILD.  She was admitted this time with worsening RV failure and AKI. Echo this admission with EF 60-65%, D-shaped septum, moderate RV enlargement/moderate RV dysfunction, PASP 59, mod-severe TR, IVC dilated. CVP is still 32 this morning, marked volume overload on exam.  She was started on dobutamine 2.5 for RV support, co-ox 72% this morning. She has had some urine output but not marked, creatinine about the same at 2.91.  SBP 90s generally.   - Continue dobutamine 2.5 for RV support.  - Increase midodrine to 10 tid.  - Increase Lasix to 30 mg/hr and continue metolazone 5 mg po bid today.   Will also give a dose of acetazolamide.  However, it does not look like we are going to be able to effectively diurese her with meds.  I talked  to Dr. Augustin Coupe today, think we will need a trial of CVVH to see if we can pull fluid off and improve RV hemodynamics.  - Continue Opsumit 10 mg daily.  - Transitioned tadalafil to sildenafil with AKI, will use 20 mg tid.  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 She does not report myalgias or muscle pain.  Myositis panel was negative.  She saw rheumatology and no definite diagnosis was given. CK normal at 62 this admission.  3. Acute on chronic hypoxemic respiratory failure: On 2L home oxygen at baseline, currently on HFNC (initially on Bipap).  4. AKI on CKD stage 3: Creatinine had been slowly rising at home with diuresis, suspect cardiorenal syndrome.  However, up to 3.3 at admission with marked hyperkalemia. No arrhythmias. K now normal, creatinine stable 2.91 today.   - Would likely tolerate iHD poorly with severe RV failure. However, diuretics are failing and we are going to give her a trial of CVVH to see if we can decongest her then titrate off the CVVH. Nephrology following.  5. ID: CT chest with LLL infiltrate.  - Ceftriaxone/azithromycin.   CRITICAL CARE Performed by: Loralie Champagne  Total critical care time: 40 minutes  Critical care time was exclusive of separately billable procedures and treating other patients.  Critical care was necessary to treat or prevent imminent or life-threatening deterioration.  Critical care was time spent personally by me on the following activities: development of treatment plan with patient and/or surrogate as well as nursing, discussions with consultants, evaluation of patient's response to treatment, examination of patient, obtaining history from patient or  surrogate, ordering and performing treatments and interventions, ordering and review of laboratory studies, ordering and review of radiographic studies, pulse oximetry and re-evaluation of patient's condition.   Length of Stay: 3  Loralie Champagne, MD  11/23/2021, 8:07 AM  Advanced Heart Failure Team Pager [phone] (M-F; 7a - 5p)  Please contact [facility] for night-coverage after hours (5p -7a ) and weekends on [email or web address]

## 2021-11-23 NOTE — Progress Notes (Signed)
Patient remains on [place] this shift. BIPAP held due to nausea/vomiting. No distress noted.

## 2021-11-23 NOTE — Progress Notes (Signed)
Assessment/Plan:56 year old BF with severe pulmonary HTN and chronic volume overload-  presents with A on CRF and hyperkalemia in the setting of what appears to be decompensated right heart failure  1.Renal- CKD as an OP which has been worsening over the last few months in the setting of escalating diuresis.  Renal ultrasound shows a  ? small left kidney but no other issues, urine is bland.  This all appears to be a cardiorenal phenomenon.   Her potassium has come down with just holding her repletion but the main issue is volume overload.  I have discussed with Dr. [name] and aggressive attempt at diuresis with suboptimal response. Will initiate CRRT but not a good long term iHD candidate.  She is so young so reasonable to give a trial of CRRT to normalize volume and see if her kidneys can recover.  If they do not this is a difficult situation as intermittent dialysis in the setting of severe pulmonary HTN and right heart failure; patients do not tolerate iHD.  Will start CRRT with UF 50-150 [name]/ incremental increase in net UF as tolerated.    2. Hyperkalemia-  due to AKI and high dose potassium repletion.  She has had hyperkalemia for several hours while here in the ER and has not had any arythmia or hemodynamic instability-  because dialysis is not tolerated in these types of patients and she was on 100 meq of potassium daily -  holding supps and treat medically -   is now WNL with medical treatment alone 3. Hypertension/volume  - clearly volume overloaded but very difficult to diurese also in patients with right sided heart failure-  per CCM and cards- plan was for lasix drip/metolazone but suboptimal response and will give trial of CRRT  4. Pulmonary-  possible PNA/atelectasis-  ABG showing resp acidosis-  now on bipap with improvement-  per CCM-  on recephin and azithro  5. Anemia  - not a major issue at this time     Shannon [name]   Subjective:  fairly stable overnight-  BP marginal on  dobutamine-  had 1120 of urine -  only 1.2L negative-  CVP is reading high (32)-  BUN and crt about the same -  she is on high flow [place] and much more alert. She feels the breathing is better than at admission.  Objective Vital signs in last 24 hours: Vitals:   11/23/21 0815 11/23/21 0830 11/23/21 0845 11/23/21 0900  BP: (!) 80/65 93/78  91/67  Pulse: 91 93 (!) 105 88  Resp: (!) 23 16 (!) 30 14  Temp:      TempSrc:      SpO2: 94% 95% 93% 95%  Weight:      Height:       Weight change: 0.1 kg  Intake/Output Summary (Last 24 hours) at 11/23/2021 0932 Last data filed at 11/23/2021 0900 Gross per 24 hour  Intake 893.89 ml  Output 1285 ml  Net -391.11 ml       Labs: Basic Metabolic Panel: Recent Labs  Lab 11/21/21 0307 11/21/21 0518 11/22/21 0316 11/22/21 1549 11/23/21 0354  NA 137   < > 135 133* 133*  K 6.6*   < > 4.6 4.4 4.8  CL 98   < > 93* 94* 95*  CO2 25   < > _0 GLUCOSE 119*   < > 112* 132* 129*  BUN 51*   < > 49* 50* 49*  CREATININE 3.19*   < >  2.92* 2.89* 2.91*  CALCIUM 9.6   < > 9.3 9.2 9.2  PHOS 5.1*  --   --   --   --    < > = values in this interval not displayed.   Liver Function Tests: Recent Labs  Lab 11/18/2021 1733 11/19/2021 2047 11/21/21 0307  AST _0 ALT _1 ALKPHOS 128* 135* 111  BILITOT 3.9* 3.7* 3.0*  PROT 8.2* 8.5* 6.9  ALBUMIN 3.6 3.7 3.0*   No results for input(s): "LIPASE", "AMYLASE" in the last 168 hours. Recent Labs  Lab 10/29/2021 2047 11/21/21 0346  AMMONIA 43* 41*   CBC: Recent Labs  Lab 11/17/2021 1450 11/03/2021 1530 10/30/2021 2305 11/21/21 0100 11/21/21 0307 11/21/21 0518 11/21/21 0610 11/23/21 0354  WBC 7.2  --  7.8  --  6.9  --   --  6.9  NEUTROABS 5.0  --   --   --   --   --   --   --   HGB 11.9*   < > 11.8*   < > 10.9* 12.6 12.6 10.1*  HCT 35.1*   < > 34.1*   < > 31.6* 37.0 37.0 29.4*  MCV 82.0  --  82.4  --  81.4  --   --  82.4  PLT 317  --  249  --  228  --   --  205   < > = values in this  interval not displayed.   Cardiac Enzymes: Recent Labs  Lab 11/21/21 1129  CKTOTAL 62   CBG: Recent Labs  Lab 11/22/21 1548 11/22/21 2004 11/22/21 2311 11/23/21 0349 11/23/21 0734  GLUCAP 141* 129* 106* 157* 157*    Iron Studies: No results for input(s): "IRON", "TIBC", "TRANSFERRIN", "FERRITIN" in the last 72 hours. Studies/Results: DG CHEST PORT 1 VIEW  Result Date: 11/23/2021 CLINICAL DATA:  evaluate for pneumonia.  11/21/2021 EXAM: PORTABLE CHEST 1 VIEW COMPARISON:  11/21/2021 FINDINGS: There is a left subclavian central venous catheter with tip at the cavoatrial junction. Stable cardiomediastinal contours. Lung volumes are low. No change in bibasilar opacities, left greater than right. Visualized osseous structures appear intact. IMPRESSION: No change in bibasilar opacities, left greater than right. Electronically Signed   By: Kerby Moors M.D.   On: 11/23/2021 08:24   VAS [place] LOWER EXTREMITY VENOUS (DVT)  Result Date: 11/22/2021  Lower Venous DVT Study Patient Name:  Shannon [name]  Date of Exam:   11/21/2021 Medical Rec #: [id number]         Accession #:    [id number] Date of Birth: Jul 27, 1965        Patient Gender: F Patient Age:   82 years Exam Location:  [facility] Procedure:      VAS [place] LOWER EXTREMITY VENOUS (DVT) Referring Phys: Jenny Reichmann PAYNE --------------------------------------------------------------------------------  Indications: Edema. Renal failure. Pulmonary hypertension. Other Indications: Acute hypoxic and hypercarbic respiratory failure due to                    decompensated right heart failure and acute decompensated                    diastolic left ventricular failure. Risk Factors: Pulmonary hypertension. Limitations: Body habitus and significant edema. Comparison Study: No prior study Performing Technologist: Sharion Dove RVS  Examination Guidelines: A complete evaluation includes B-mode imaging, spectral Doppler, color Doppler, and power Doppler as  needed of all accessible portions of each vessel. Bilateral testing  is considered an integral part of a complete examination. Limited examinations for reoccurring indications may be performed as noted. The reflux portion of the exam is performed with the patient in reverse Trendelenburg.  +---------+---------------+---------+-----------+---------------+--------------+ RIGHT    CompressibilityPhasicitySpontaneityProperties     Thrombus Aging +---------+---------------+---------+-----------+---------------+--------------+ CFV      Full                               pulsatile                                                                 waveforms                     +---------+---------------+---------+-----------+---------------+--------------+ FV Prox                                     pulsatile      patent by                                                  waveforms      color and                                                                 Doppler        +---------+---------------+---------+-----------+---------------+--------------+ FV Mid                                      pulsatile      patent by                                                  waveforms      color and                                                                 Doppler        +---------+---------------+---------+-----------+---------------+--------------+ FV Distal                                   pulsatile      patent by  waveforms      color and                                                                 Doppler        +---------+---------------+---------+-----------+---------------+--------------+ PFV                                                        Not well                                                                  visualized      +---------+---------------+---------+-----------+---------------+--------------+ POP                                                        Not well                                                                  visualized     +---------+---------------+---------+-----------+---------------+--------------+ PTV      Full                                                             +---------+---------------+---------+-----------+---------------+--------------+ PERO     Full                                                             +---------+---------------+---------+-----------+---------------+--------------+  +---------+---------------+---------+-----------+---------------+--------------+ LEFT     CompressibilityPhasicitySpontaneityProperties     Thrombus Aging +---------+---------------+---------+-----------+---------------+--------------+ CFV                                         pulsatile      patent by                                                  waveforms      color and  Doppler        +---------+---------------+---------+-----------+---------------+--------------+ FV Prox                                     pulsatile      patent by                                                  waveforms      color and                                                                 Doppler        +---------+---------------+---------+-----------+---------------+--------------+ FV Mid                                      pulsatile      patent by                                                  waveforms      color and                                                                 Doppler        +---------+---------------+---------+-----------+---------------+--------------+ FV Distal                                   pulsatile      patent by                                                   waveforms      color and                                                                 Doppler        +---------+---------------+---------+-----------+---------------+--------------+ PFV                                                        Not well  visualized     +---------+---------------+---------+-----------+---------------+--------------+ POP                                         pulsatile                                                                 waveforms                     +---------+---------------+---------+-----------+---------------+--------------+ PTV                                                        Not well                                                                  visualized     +---------+---------------+---------+-----------+---------------+--------------+ PERO                                                       Not well                                                                  visualized     +---------+---------------+---------+-----------+---------------+--------------+     *See table(s) above for measurements and observations. Electronically signed by Jamelle Haring on 11/22/2021 at 9:26:06 AM.    Final    ECHOCARDIOGRAM COMPLETE  Result Date: 11/21/2021    ECHOCARDIOGRAM REPORT   Patient Name:   Shannon [name] Date of Exam: 11/21/2021 Medical Rec #:  [id number]        Height:       60.0 in Accession #:    [id number]       Weight:       196.9 lb Date of Birth:  06/24/1965       BSA:          1.854 m Patient Age:    39 years         BP:           113/53 mmHg Patient Gender: F                HR:           91 bpm. Exam Location:  Inpatient Procedure: 2D Echo Indications:    congestive heart failure  History:        Patient has prior history of Echocardiogram examinations, most  recent 03/05/2021. Risk Factors:Hypertension, Diabetes and                 Dyslipidemia.  Sonographer:    [place] Referring Phys: [id number] [place]  1. Left ventricular ejection fraction, by estimation, is 60 to 65%. The left ventricle has normal function. The left ventricle has no regional wall motion abnormalities. Left ventricular diastolic parameters were normal. There is the interventricular septum is flattened in systole and diastole, consistent with right ventricular pressure and volume overload.  2. Right ventricular systolic function is moderately reduced. The right ventricular size is moderately enlarged. There is moderately elevated pulmonary artery systolic pressure. The estimated right ventricular systolic pressure is 84.2 mmHg.  3. Right atrial size was severely dilated.  4. The mitral valve is normal in structure. No evidence of mitral valve regurgitation.  5. Tricuspid valve regurgitation is moderate to severe.  6. The aortic valve is tricuspid. There is mild calcification of the aortic valve. Aortic valve regurgitation is not visualized. Aortic valve sclerosis is present, with no evidence of aortic valve stenosis.  7. The inferior vena cava is dilated in size with <50% respiratory variability, suggesting right atrial pressure of 15 mmHg. Comparison(s): No significant change from prior study. Prior images reviewed side by side. FINDINGS  Left Ventricle: Left ventricular ejection fraction, by estimation, is 60 to 65%. The left ventricle has normal function. The left ventricle has no regional wall motion abnormalities. The left ventricular internal cavity size was normal in size. There is  no left ventricular hypertrophy. The interventricular septum is flattened in systole and diastole, consistent with right ventricular pressure and volume overload. Left ventricular diastolic parameters were normal. Normal left ventricular filling pressure. Right Ventricle: The right  ventricular size is moderately enlarged. No increase in right ventricular wall thickness. Right ventricular systolic function is moderately reduced. There is moderately elevated pulmonary artery systolic pressure. The tricuspid  regurgitant velocity is 3.31 m/s, and with an assumed right atrial pressure of 15 mmHg, the estimated right ventricular systolic pressure is 10.3 mmHg. Left Atrium: Left atrial size was normal in size. Right Atrium: Right atrial size was severely dilated. Pericardium: There is no evidence of pericardial effusion. Mitral Valve: The mitral valve is normal in structure. No evidence of mitral valve regurgitation. Tricuspid Valve: The tricuspid valve is grossly normal. Tricuspid valve regurgitation is moderate to severe. Aortic Valve: The aortic valve is tricuspid. There is mild calcification of the aortic valve. Aortic valve regurgitation is not visualized. Aortic valve sclerosis is present, with no evidence of aortic valve stenosis. Pulmonic Valve: The pulmonic valve was grossly normal. Pulmonic valve regurgitation is mild. Aorta: The aortic root and ascending aorta are structurally normal, with no evidence of dilitation. Venous: The inferior vena cava is dilated in size with less than 50% respiratory variability, suggesting right atrial pressure of 15 mmHg. IAS/Shunts: There is left bowing of the interatrial septum, suggestive of elevated right atrial pressure. No atrial level shunt detected by color flow Doppler. Additional Comments: There is pleural effusion in the left lateral region.  LEFT VENTRICLE PLAX 2D LVIDd:         3.60 cm   Diastology LVIDs:         2.20 cm   LV e' medial:    7.40 cm/s LV PW:         0.90 cm   LV E/e' medial:  10.5 LV IVS:        0.90 cm  LV e' lateral:   10.10 cm/s LVOT diam:     1.90 cm   LV E/e' lateral: 7.7 LV SV:         44 LV SV Index:   24 LVOT Area:     2.84 cm  RIGHT VENTRICLE             IVC RV S prime:     10.10 cm/s  IVC diam: 2.20 cm TAPSE (M-mode):  1.3 cm LEFT ATRIUM             Index        RIGHT ATRIUM           Index LA diam:        3.50 cm 1.89 cm/m   RA Area:     18.90 cm LA Vol (A2C):   20.0 ml 10.79 ml/m  RA Volume:   54.80 ml  29.56 ml/m LA Vol (A4C):   25.2 ml 13.59 ml/m LA Biplane Vol: 24.0 ml 12.94 ml/m  AORTIC VALVE LVOT Vmax:   89.40 cm/s LVOT Vmean:  59.800 cm/s LVOT VTI:    0.155 m  AORTA Ao Root diam: 2.40 cm Ao Asc diam:  2.80 cm MITRAL VALVE               TRICUSPID VALVE MV Area (PHT): 4.15 cm    TR Peak grad:   43.8 mmHg MV Decel Time: 183 msec    TR Vmax:        331.00 cm/s MV E velocity: 77.60 cm/s MV A velocity: 66.40 cm/s  SHUNTS MV E/A ratio:  1.17        Systemic VTI:  0.16 m                            Systemic Diam: 1.90 cm Mihai Croitoru MD Electronically signed by Sanda Klein MD Signature Date/Time: 11/21/2021/12:46:52 PM    Final    Medications: Infusions:  sodium chloride     azithromycin Stopped (11/22/21 2205)   cefTRIAXone (ROCEPHIN)  IV Stopped (11/22/21 2135)   DOBUTamine 2.5 mcg/kg/min (11/23/21 0900)   furosemide (LASIX) 200 mg in dextrose 5 % 100 mL (2 mg/mL) infusion 30 mg/hr (11/23/21 0900)    Scheduled Medications:  acetaZOLAMIDE  250 mg Oral Once   Chlorhexidine Gluconate Cloth  6 each Topical Daily   heparin  5,000 Units Subcutaneous Q8H   insulin aspart  0-6 Units Subcutaneous Q4H   macitentan  10 mg Oral Daily   metolazone  5 mg Oral BID   midodrine  10 mg Oral TID WC   mouth rinse  15 mL Mouth Rinse 4 times per day   sildenafil  20 mg Oral TID    have reviewed scheduled and prn medications.  Physical Exam: General:  alert-  on HFNC Heart:tachy Lungs: CBS bilat Abdomen: non tender Extremities: pitting edema Dialysis Access: none yet     11/23/2021,9:32 AM  LOS: 3 days

## 2021-11-23 NOTE — TOC Initial Note (Addendum)
Transition of Care [facility]) - Initial/Assessment Note    Patient Details  Name: Shannon [name] MRN: [id number] Date of Birth: 17-Aug-1965  [facility] CM/SW Contact:    Erenest Rasher, RN Phone Number: [phone] 11/23/2021, 4:08 PM  Clinical Narrative:                 HF TOC CM spoke to pt and mother at bedside. Pt states she lives alone. Has oxygen ([employer]), and bedside commode.Waiting PT recommendations.  Expected Discharge Plan: [place] Barriers to Discharge: Continued Medical Work up   Patient Goals and CMS Choice Patient states their goals for this hospitalization and ongoing recovery are:: wants to just feel better CMS Medicare.gov Compare Post Acute Care list provided to:: Patient    Expected Discharge Plan and Services Expected Discharge Plan: [place]   Discharge Planning Services: CM Consult   Living arrangements for the past 2 months: Apartment                                      Prior Living Arrangements/Services Living arrangements for the past 2 months: Apartment Lives with:: Self Patient language and need for interpreter reviewed:: Yes Do you feel safe going back to the place where you live?: Yes      Need for Family Participation in Patient Care: Yes (Comment) Care giver support system in place?: Yes (comment)   Criminal Activity/Legal Involvement Pertinent to Current Situation/Hospitalization: No - Comment as needed  Activities of Daily Living      Permission Sought/Granted Permission sought to share information with : Family Supports, PCP, Case Manager Permission granted to share information with : Yes, Verbal Permission Granted  Share Information with NAME: Liam Graham     Permission granted to share info w Relationship: mother  Permission granted to share info w Contact Information: [phone]  Emotional Assessment Appearance:: Appears stated  age Attitude/Demeanor/Rapport: Engaged Affect (typically observed): Accepting Orientation: : Oriented to Self, Oriented to Place, Oriented to  Time, Oriented to Situation   Psych Involvement: No (comment)  Admission diagnosis:  Hyperkalemia [E87.5] Acute respiratory failure with hypoxia ([place]) [J96.01] Acute kidney injury [facility]) [N17.9] Patient Active Problem List   Diagnosis Date Noted   Pressure injury of skin 11/23/2021   Acute respiratory failure with hypoxia ([place]) 11/06/2021   AKI (acute kidney injury) ([place])    Hyperkalemia    Community acquired pneumonia of left lower lobe of lung    Pulmonary hypertension ([place]) 07/10/2021   Mixed hyperlipidemia 06/08/2020   DOE (dyspnea on exertion) 03/25/2016   Abnormal chest CT 03/25/2016   Type 2 diabetes mellitus with complication, without long-term current use of insulin ([place]) 03/25/2016   Essential hypertension 12/18/2015   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

## 2021-11-24 DIAGNOSIS — J9601 Acute respiratory failure with hypoxia: Secondary | ICD-10-CM | POA: Diagnosis not present

## 2021-11-24 DIAGNOSIS — R570 Cardiogenic shock: Secondary | ICD-10-CM | POA: Diagnosis not present

## 2021-11-24 DIAGNOSIS — N179 Acute kidney failure, unspecified: Secondary | ICD-10-CM | POA: Diagnosis not present

## 2021-11-24 DIAGNOSIS — E875 Hyperkalemia: Secondary | ICD-10-CM | POA: Diagnosis not present

## 2021-11-24 LAB — GLUCOSE, CAPILLARY
Glucose-Capillary: 97 mg/dL (ref 70–99)
Glucose-Capillary: 116 mg/dL — ABNORMAL HIGH (ref 70–99)
Glucose-Capillary: 124 mg/dL — ABNORMAL HIGH (ref 70–99)
Glucose-Capillary: 130 mg/dL — ABNORMAL HIGH (ref 70–99)
Glucose-Capillary: 168 mg/dL — ABNORMAL HIGH (ref 70–99)

## 2021-11-24 LAB — CBC
HCT: 28.9 % — ABNORMAL LOW (ref 36.0–46.0)
Hemoglobin: 9.8 g/dL — ABNORMAL LOW (ref 12.0–15.0)
MCH: 28.1 pg (ref 26.0–34.0)
MCHC: 33.9 g/dL (ref 30.0–36.0)
MCV: 82.8 fL (ref 80.0–100.0)
Platelets: 152 10*3/uL (ref 150–400)
RBC: 3.49 MIL/uL — ABNORMAL LOW (ref 3.87–5.11)
RDW: 18.2 % — ABNORMAL HIGH (ref 11.5–15.5)
WBC: 7.3 10*3/uL (ref 4.0–10.5)
nRBC: 0.8 % — ABNORMAL HIGH (ref 0.0–0.2)

## 2021-11-24 LAB — RENAL FUNCTION PANEL
Albumin: 2.9 g/dL — ABNORMAL LOW (ref 3.5–5.0)
Albumin: 3.2 g/dL — ABNORMAL LOW (ref 3.5–5.0)
Anion gap: 9 (ref 5–15)
Anion gap: 8 (ref 5–15)
BUN: 30 mg/dL — ABNORMAL HIGH (ref 6–20)
BUN: 22 mg/dL — ABNORMAL HIGH (ref 6–20)
CO2: 25 mmol/L (ref 22–32)
CO2: 27 mmol/L (ref 22–32)
Calcium: 8 mg/dL — ABNORMAL LOW (ref 8.9–10.3)
Calcium: 8.4 mg/dL — ABNORMAL LOW (ref 8.9–10.3)
Chloride: 98 mmol/L (ref 98–111)
Chloride: 96 mmol/L — ABNORMAL LOW (ref 98–111)
Creatinine, Ser: 1.74 mg/dL — ABNORMAL HIGH (ref 0.44–1.00)
Creatinine, Ser: 1.53 mg/dL — ABNORMAL HIGH (ref 0.44–1.00)
GFR, Estimated: 35 mL/min — ABNORMAL LOW (ref >60)
GFR, Estimated: 40 mL/min — ABNORMAL LOW (ref >60)
Glucose, Bld: 96 mg/dL (ref 70–99)
Glucose, Bld: 175 mg/dL — ABNORMAL HIGH (ref 70–99)
Phosphorus: 3 mg/dL (ref 2.5–4.6)
Phosphorus: 2.8 mg/dL (ref 2.5–4.6)
Potassium: 4 mmol/L (ref 3.5–5.1)
Potassium: 4.5 mmol/L (ref 3.5–5.1)
Sodium: 132 mmol/L — ABNORMAL LOW (ref 135–145)
Sodium: 131 mmol/L — ABNORMAL LOW (ref 135–145)

## 2021-11-24 LAB — COOXEMETRY PANEL
Carboxyhemoglobin: 2.1 % — ABNORMAL HIGH (ref 0.5–1.5)
Methemoglobin: 0.8 % (ref 0.0–1.5)
O2 Saturation: 68.9 %
Total hemoglobin: 7.6 g/dL — ABNORMAL LOW (ref 12.0–16.0)

## 2021-11-24 LAB — MAGNESIUM: Magnesium: 2 mg/dL (ref 1.7–2.4)

## 2021-11-24 MED ORDER — NOREPINEPHRINE 4 MG/250ML-% IV SOLN
0.0000 ug/min | INTRAVENOUS | Status: DC
  Administered 2021-11-24: 2 ug/min via INTRAVENOUS
  Administered 2021-11-25: 7 ug/min via INTRAVENOUS
  Administered 2021-11-25: 7.013 ug/min via INTRAVENOUS
  Administered 2021-11-25 – 2021-11-26 (×3): 7 ug/min via INTRAVENOUS
  Administered 2021-11-27: 10 ug/min via INTRAVENOUS
  Administered 2021-11-27 (×2): 8 ug/min via INTRAVENOUS
  Administered 2021-11-28 (×2): 10 ug/min via INTRAVENOUS
  Administered 2021-11-29: 14 ug/min via INTRAVENOUS
  Administered 2021-11-29: 10 ug/min via INTRAVENOUS
  Administered 2021-11-29: 22 ug/min via INTRAVENOUS
  Filled 2021-11-24 (×16): qty 250

## 2021-11-24 MED ORDER — DOBUTAMINE IN D5W 4-5 MG/ML-% IV SOLN
5.0000 ug/kg/min | INTRAVENOUS | Status: DC
Start: 2021-11-24 — End: 2021-12-01
  Administered 2021-11-24: 2.5 ug/kg/min via INTRAVENOUS
  Administered 2021-11-26 – 2021-11-30 (×4): 5 ug/kg/min via INTRAVENOUS
  Filled 2021-11-24 (×4): qty 250

## 2021-11-24 MED ORDER — SENNOSIDES-DOCUSATE SODIUM 8.6-50 MG PO TABS
1.0000 | ORAL_TABLET | Freq: Two times a day (BID) | ORAL | Status: DC
  Administered 2021-11-24 (×2): 1 via ORAL
  Filled 2021-11-24 (×3): qty 1

## 2021-11-24 MED ORDER — NOREPINEPHRINE 4 MG/250ML-% IV SOLN: 0.0000 ug/min | INTRAVENOUS | Status: DC

## 2021-11-24 MED ORDER — MIDODRINE HCL 5 MG PO TABS
15.0000 mg | ORAL_TABLET | Freq: Three times a day (TID) | ORAL | Status: DC
  Administered 2021-11-24 – 2021-11-25 (×4): 15 mg via ORAL
  Filled 2021-11-24 (×5): qty 3

## 2021-11-24 MED ORDER — POLYETHYLENE GLYCOL 3350 17 G PO PACK
17.0000 g | PACK | Freq: Every day | ORAL | Status: DC
  Administered 2021-11-24 – 2021-11-30 (×3): 17 g via ORAL
  Filled 2021-11-24 (×3): qty 1

## 2021-11-24 NOTE — Progress Notes (Addendum)
Patient ID: Shannon [name], female   DOB: 02-Dec-1965, 56 y.o.   MRN: [id number]     Advanced Heart Failure Rounding Note  PCP-Cardiologist: Fransico Him, MD   Subjective:    7/31: started on CRRT, continued on lasix gtt  Today on DBA 6 (order written as titratable). Co-ox 69%.   On Lasix gtt at 30/hr + metolazone 5 mg po bid.   Remains oliguric. Continues w/ poor UOP, only 342 yesterday. 2.3L volume removal off CRRT yesterday.   CRRT currently pulling 150 cc/hr. Pull rate limited by soft BPs. On midodrine 10 tid.   CVP still 30. On HFNC 20L/min. Continues on abx for CAP.   Feels breathing has slightly improved. Sister present at bedside.    Echo: EF 60-65%, D-shaped septum, moderate RV enlargement/moderate RV dysfunction, PASP 59, mod-severe TR, IVC dilated.    Objective:   Weight Range: 95 kg Body mass index is 40.9 kg/m.   Vital Signs:   Temp:  [97.3 F (36.3 C)-98 F (36.7 C)] 97.6 F (36.4 C) (08/01 0800) Pulse Rate:  [44-105] 88 (08/01 0800) Resp:  [12-30] 17 (08/01 0800) BP: (80-145)/(53-126) 87/66 (08/01 0800) SpO2:  [92 %-99 %] 92 % (08/01 0800) FiO2 (%):  [60 %-70 %] 60 % (08/01 0743) Weight:  [95 kg] 95 kg (08/01 0600) Last BM Date : 11/22/21  Weight change: Filed Weights   11/22/21 0415 11/23/21 0355 11/24/21 0600  Weight: 89.3 kg 89.4 kg 95 kg    Intake/Output:   Intake/Output Summary (Last 24 hours) at 11/24/2021 0832 Last data filed at 11/24/2021 0800 Gross per 24 hour  Intake 1865.71 ml  Output 2569 ml  Net -703.29 ml      Physical Exam    CVP 30  General:  obese, sitting up in chair. No respiratory difficulty HEENT: normal Neck: supple. JVD elevated to jaw. + HD cath rt IJ, Carotids 2+ bilat; no bruits. No lymphadenopathy or thyromegaly appreciated. Cor: PMI nondisplaced. Regular rate & rhythm. No rubs, gallops or murmurs. Lungs: decreased BS at the bases  Abdomen: obese, soft, nontender, nondistended. No hepatosplenomegaly. No  bruits or masses. Good bowel sounds. Extremities: no cyanosis, clubbing, rash, 2+ b/l LE edema + TED hoses  Neuro: alert & oriented x 3, cranial nerves grossly intact. moves all 4 extremities w/o difficulty. Affect pleasant.   Telemetry   Sinus tachy 100s (personally reviewed)  Labs    CBC Recent Labs    11/23/21 0354 11/24/21 0405  WBC 6.9 7.3  HGB 10.1* 9.8*  HCT 29.4* 28.9*  MCV 82.4 82.8  PLT 205 245   Basic Metabolic Panel Recent Labs    11/23/21 0354 11/23/21 1708 11/24/21 0405  NA 133* 133* 132*  K 4.8 4.5 4.0  CL 95* 95* 98  CO2 _0 GLUCOSE 129* 118* 96  BUN 49* 39* 30*  CREATININE 2.91* 2.30* 1.74*  CALCIUM 9.2 8.7* 8.0*  MG 2.0  --  2.0  PHOS  --  3.8 3.0   Liver Function Tests Recent Labs    11/23/21 1708 11/24/21 0405  ALBUMIN 3.3* 2.9*   No results for input(s): "LIPASE", "AMYLASE" in the last 72 hours. Cardiac Enzymes Recent Labs    11/21/21 1129  CKTOTAL 62    BNP: BNP (last 3 results) Recent Labs    09/10/21 1614 11/11/21 1620 10/28/2021 1510  BNP 916.7* 1,063.1* 1,014.0*    ProBNP (last 3 results) No results for input(s): "PROBNP" in the last 8760 hours.  D-Dimer No results for input(s): "DDIMER" in the last 72 hours. Hemoglobin A1C No results for input(s): "HGBA1C" in the last 72 hours.  Fasting Lipid Panel No results for input(s): "CHOL", "HDL", "LDLCALC", "TRIG", "CHOLHDL", "LDLDIRECT" in the last 72 hours. Thyroid Function Tests No results for input(s): "TSH", "T4TOTAL", "T3FREE", "THYROIDAB" in the last 72 hours.  Invalid input(s): "FREET3"  Other results:   Imaging    DG CHEST PORT 1 VIEW  Result Date: 11/23/2021 CLINICAL DATA:  Tunneled central venous catheter insertion EXAM: PORTABLE CHEST 1 VIEW COMPARISON:  Portable exam 1219 hours compared to 0816 hours FINDINGS: LEFT subclavian line with tip projecting over RIGHT atrium unchanged. New RIGHT jugular central venous catheter with tip projecting over  RIGHT atrium. Markedly decreased lung volumes with significant bibasilar atelectasis. No pneumothorax. Heart appears enlarged. IMPRESSION: No pneumothorax following RIGHT jugular line placement. Markedly decreased lung volumes with bibasilar atelectasis. Electronically Signed   By: Lavonia Dana M.D.   On: 11/23/2021 12:30     Medications:     Scheduled Medications:  Chlorhexidine Gluconate Cloth  6 each Topical Daily   heparin  5,000 Units Subcutaneous Q8H   insulin aspart  0-6 Units Subcutaneous Q4H   macitentan  10 mg Oral Daily   metolazone  5 mg Oral BID   midodrine  10 mg Oral TID WC   mouth rinse  15 mL Mouth Rinse 4 times per day   polyethylene glycol  17 g Oral Daily   senna-docusate  1 tablet Oral BID   sildenafil  20 mg Oral TID    Infusions:   prismasol BGK 4/2.5 500 mL/hr at 11/23/21 2227    prismasol BGK 4/2.5 300 mL/hr at 11/24/21 0646   sodium chloride     azithromycin Stopped (11/23/21 2359)   cefTRIAXone (ROCEPHIN)  IV Stopped (11/23/21 2250)   DOBUTamine 6 mcg/kg/min (11/24/21 0800)   furosemide (LASIX) 200 mg in dextrose 5 % 100 mL (2 mg/mL) infusion 30 mg/hr (11/24/21 0800)   prismasol BGK 4/2.5 1,500 mL/hr at 11/24/21 0646    PRN Medications: Place/Maintain arterial line **AND** sodium chloride, docusate sodium, heparin, ondansetron (ZOFRAN) IV, mouth rinse, polyethylene glycol, simethicone, sodium chloride, white petrolatum    Assessment/Plan   1. Pulmonary hypertension/RV failure: Echo in 11/22 showed EF 60-65%, mild LVH, severe RV enlargement with severely decreased RV systolic function, PASP 66 mmHg, severe RAE, moderate TR, moderate PR, small pericardial effusion. CTA chest showed no PE, normal lung fields and V/Q scan showed no evidence for chronic PE.  PFTs showed severe restriction and decreased DLCO.  RHC/LHC showed severe pulmonary arterial HTN with RV failure.  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  No history of liver  disease.  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  ANA, SCL-70, anti-centromere antibody, and RF negative with elevated CRP. She may have connective tissue disease-related PAH though she saw rheumatology and no definite diagnosis was given.  High resolution CT chest showed no evidence for ILD.  She was admitted this time with worsening RV failure and AKI. Echo this admission with EF 60-65%, D-shaped septum, moderate RV enlargement/moderate RV dysfunction, PASP 59, mod-severe TR, IVC dilated. CVP is still 30 this morning, marked volume overload on exam.  She was started on dobutamine 2.5 for RV support (currently running at 6 mgc/kg/min, titratable order) co-ox 69% this morning. Oliguric despite high dose lasix gtt at 30/hr. Started  on CRRT 7/31 for retractable volume overload. Currently pulling 150 cc/hr and limited by hypotension  - reduce DBA to 2.5. Add NE for BP support to allow more aggressive volume removal off CRRT  - Continue dobutamine 2.5 for RV support.  - Continue midodrine 10 tid.  - ? Continuation of Lasix gtt and metolazone 5 mg po. Defer to nephrology  - Continue Opsumit 10 mg daily.  - Transitioned tadalafil to sildenafil with AKI, will use 20 mg tid.  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 She does not report myalgias or muscle pain.  Myositis panel was negative.  She saw rheumatology and no definite diagnosis was given. CK normal at 62 this admission.  3. Acute on chronic hypoxemic respiratory failure: On 2L home oxygen at baseline, currently on HFNC (initially on Bipap).  - c/w abx for CAP per PCCM  4. AKI on CKD stage 3: Creatinine had been slowly rising at home with diuresis, suspect cardiorenal syndrome.  However, up to 3.3 at admission with marked  hyperkalemia. No arrhythmias. K now normal, creatinine trending down, 1.74 today.   -  Continue trial of CVVH to see if we can decongest her then titrate off the CVVH. Would likely tolerate iHD poorly with severe RV failure. - Nephrology following. Appreciate their assistance  5. ID: CT chest with LLL infiltrate.  - Ceftriaxone/azithromycin.   Length of Stay: [address], PA-C  11/24/2021, 8:32 AM  Advanced Heart Failure Team Pager [phone] (M-F; 7a - 5p)  Please contact [facility] for night-coverage after hours (5p -7a ) and weekends on [email or web address]   Patient seen with PA, agree with the above note.   She has been started on CVVH.  Currently on dobutamine 2.5 + NE 2.  SBP 90s generally.   Currently pulling UF 150 cc/hr.  CVP remains 30.   General: NAD Neck: JVP 16+, no thyromegaly or thyroid nodule.  Lungs: Clear to auscultation bilaterally with normal respiratory effort. CV: Nondisplaced PMI.  Heart regular S1/S2, no S3/S4, no murmur.  1+ edema 1/2 to knees bilaterally.  Abdomen: Soft, nontender, no hepatosplenomegaly, no distention.  Skin: Intact without lesions or rashes.  Neurologic: Alert and oriented x 3.  Psych: Normal affect. Extremities: No clubbing or cyanosis.  HEENT: Normal.   Continue dobutamine 2.5, use NE to maintain MAP for CVVH as needed.  Will increase midodrine to 15 mg tid.   Needs ongoing fluid removal, continue CVVH UF 150 cc/hr.  Stop Lasix gtt and metolazone.   CRITICAL CARE Performed by: Loralie Champagne  Total critical care time: 35 minutes  Critical care time was exclusive of separately billable procedures and treating other patients.  Critical care was necessary to treat or prevent imminent or life-threatening deterioration.  Critical care was time spent personally by me on the following activities: development of treatment plan with patient and/or surrogate as well as nursing, discussions with consultants, evaluation of patient's response  to treatment, examination of patient, obtaining history from patient or surrogate, ordering and performing treatments and interventions, ordering and review of laboratory studies, ordering and review of radiographic studies, pulse oximetry and re-evaluation of patient's condition.  Loralie Champagne 11/24/2021 9:31 AM

## 2021-11-24 NOTE — Progress Notes (Signed)
Assessment/Plan:56 year old BF with severe pulmonary HTN and chronic volume overload-  presents with A on CRF and hyperkalemia in the setting of what appears to be decompensated right heart failure  1.Renal- CKD as an OP which has been worsening over the last few months in the setting of escalating diuresis.  Renal ultrasound shows a  ? small left kidney but no other issues, urine is bland.  This all appears to be a cardiorenal phenomenon.   Her potassium has come down with just holding her repletion but the main issue is volume overload.  I have discussed with Dr. [name] and aggressive attempt at diuresis with suboptimal response. Will initiate CRRT but not a good long term iHD candidate.  She is so young so reasonable to give a trial of CRRT to normalize volume and see if her kidneys can recover.  If they do not this is a difficult situation as intermittent dialysis in the setting of severe pulmonary HTN and right heart failure; these patients do not tolerate iHD.  Seen on CRRT with UF 50-150 (currently at 150) -> net neg 760 ml/ 24hr; currently on Dobutamine 2 + Levophed 2. Will increase UF rate to 222m /hr as tolerated. CVP still 30-31.  2. Hyperkalemia-  due to AKI and high dose potassium repletion.  She has had hyperkalemia for several hours while here in the ER and has not had any arythmia or hemodynamic instability-  because dialysis is not tolerated in these types of patients and she was on 100 meq of potassium daily -  holding supps and treat medically -   is now WNL with medical treatment alone 3. Hypertension/volume  - clearly volume overloaded but very difficult to diurese also in patients with right sided heart failure-  per CCM and cards- plan was for lasix drip/metolazone but suboptimal response and now  giving trial of CRRT  4. Pulmonary-  possible PNA/atelectasis-  ABG showing resp acidosis-  now on bipap with improvement-  per CCM-  on recephin and azithro  5. Anemia  - not a major  issue at this time   Tremar Wickens W   Subjective:  fairly stable overnight-  tolerating UF 150 now on Dobutamine and Levophed.  CVP still reading high (30-31). She feels the breathing is improving.  Objective Vital signs in last 24 hours: Vitals:   11/24/21 0700 11/24/21 0743 11/24/21 0800 11/24/21 0900  BP: 93/62  (!) 87/66 92/71  Pulse: 85 90 88 91  Resp: (!) _0 (!) 22  Temp:   97.6 F (36.4 C)   TempSrc:   Oral   SpO2: 93% 92% 92% (!) 89%  Weight:      Height:       Weight change: 5.6 kg  Intake/Output Summary (Last 24 hours) at 11/24/2021 0953 Last data filed at 11/24/2021 09629Gross per 24 hour  Intake 2075.12 ml  Output 2836 ml  Net -760.88 ml       Labs: Basic Metabolic Panel: Recent Labs  Lab 11/21/21 0307 11/21/21 0518 11/23/21 0354 11/23/21 1708 11/24/21 0405  NA 137   < > 133* 133* 132*  K 6.6*   < > 4.8 4.5 4.0  CL 98   < > 95* 95* 98  CO2 25   < > _1 GLUCOSE 119*   < > 129* 118* 96  BUN 51*   < > 49* 39* 30*  CREATININE 3.19*   < > 2.91* 2.30* 1.74*  CALCIUM 9.6   < > 9.2 8.7* 8.0*  PHOS 5.1*  --   --  3.8 3.0   < > = values in this interval not displayed.   Liver Function Tests: Recent Labs  Lab 11/07/2021 1733 11/16/2021 2047 11/21/21 0307 11/23/21 1708 11/24/21 0405  AST _0 --   --   ALT _1 --   --   ALKPHOS 128* 135* 111  --   --   BILITOT 3.9* 3.7* 3.0*  --   --   PROT 8.2* 8.5* 6.9  --   --   ALBUMIN 3.6 3.7 3.0* 3.3* 2.9*   No results for input(s): "LIPASE", "AMYLASE" in the last 168 hours. Recent Labs  Lab 11/17/2021 2047 11/21/21 0346  AMMONIA 43* 41*   CBC: Recent Labs  Lab 10/24/2021 1450 11/13/2021 1530 11/01/2021 2305 11/21/21 0100 11/21/21 0307 11/21/21 0518 11/21/21 0610 11/23/21 0354 11/24/21 0405  WBC 7.2  --  7.8  --  6.9  --   --  6.9 7.3  NEUTROABS 5.0  --   --   --   --   --   --   --   --   HGB 11.9*   < > 11.8*   < > 10.9*   < > 12.6 10.1* 9.8*  HCT 35.1*   < > 34.1*   < > 31.6*   <  > 37.0 29.4* 28.9*  MCV 82.0  --  82.4  --  81.4  --   --  82.4 82.8  PLT 317  --  249  --  228  --   --  205 152   < > = values in this interval not displayed.   Cardiac Enzymes: Recent Labs  Lab 11/21/21 1129  CKTOTAL 62   CBG: Recent Labs  Lab 11/23/21 1543 11/23/21 1953 11/23/21 2342 11/24/21 0302 11/24/21 0740  GLUCAP 121* 113* 104* 97 116*    Iron Studies: No results for input(s): "IRON", "TIBC", "TRANSFERRIN", "FERRITIN" in the last 72 hours. Studies/Results: DG CHEST PORT 1 VIEW  Result Date: 11/23/2021 CLINICAL DATA:  Tunneled central venous catheter insertion EXAM: PORTABLE CHEST 1 VIEW COMPARISON:  Portable exam 1219 hours compared to 0816 hours FINDINGS: LEFT subclavian line with tip projecting over RIGHT atrium unchanged. New RIGHT jugular central venous catheter with tip projecting over RIGHT atrium. Markedly decreased lung volumes with significant bibasilar atelectasis. No pneumothorax. Heart appears enlarged. IMPRESSION: No pneumothorax following RIGHT jugular line placement. Markedly decreased lung volumes with bibasilar atelectasis. Electronically Signed   By: Lavonia Dana M.D.   On: 11/23/2021 12:30   DG CHEST PORT 1 VIEW  Result Date: 11/23/2021 CLINICAL DATA:  evaluate for pneumonia.  11/21/2021 EXAM: PORTABLE CHEST 1 VIEW COMPARISON:  11/21/2021 FINDINGS: There is a left subclavian central venous catheter with tip at the cavoatrial junction. Stable cardiomediastinal contours. Lung volumes are low. No change in bibasilar opacities, left greater than right. Visualized osseous structures appear intact. IMPRESSION: No change in bibasilar opacities, left greater than right. Electronically Signed   By: Kerby Moors M.D.   On: 11/23/2021 08:24   Medications: Infusions:   prismasol BGK 4/2.5 500 mL/hr at 11/23/21 2227    prismasol BGK 4/2.5 300 mL/hr at 11/24/21 0930   sodium chloride     azithromycin Stopped (11/23/21 2359)   cefTRIAXone (ROCEPHIN)  IV Stopped  (11/23/21 2250)   DOBUTamine 2.5 mcg/kg/min (11/24/21 0905)   norepinephrine (LEVOPHED) Adult infusion 2 mcg/min (  11/24/21 0904)   prismasol BGK 4/2.5 1,500 mL/hr at 11/24/21 0646    Scheduled Medications:  Chlorhexidine Gluconate Cloth  6 each Topical Daily   heparin  5,000 Units Subcutaneous Q8H   insulin aspart  0-6 Units Subcutaneous Q4H   macitentan  10 mg Oral Daily   midodrine  15 mg Oral TID WC   mouth rinse  15 mL Mouth Rinse 4 times per day   polyethylene glycol  17 g Oral Daily   senna-docusate  1 tablet Oral BID   sildenafil  20 mg Oral TID    have reviewed scheduled and prn medications.  Physical Exam: General:  alert-  on HFNC Heart:tachy Lungs: decr movement, coarse rales present Abdomen: non tender Extremities: pitting edema, TEDS Dialysis Access: temp cath working well, no oozing     11/24/2021,9:53 AM  LOS: 4 days

## 2021-11-24 NOTE — Progress Notes (Signed)
NAME:  Shannon [name], MRN:  [id number], DOB:  [birth year], LOS: 4 ADMISSION DATE:  11/01/2021, CONSULTATION DATE:  7/28 REFERRING MD:  Dr. Regenia Skeeter, CHIEF COMPLAINT:  chf exacerbation   History of Present Illness:  Patient is a 56 year old female with pertinent PMH HTN, DMT2, severe pulmonary HTN followed by Shannon [name] presents to [facility] ED on 7/28 with SOB.  Patient has been experiencing weakness for the past several days and having trouble walking without getting SOB.  Today on 7/28 patient was more confused per family member.  Also was having trouble breathing.  Patient wears 2L [place] at home.  EMS called and transported patient to [facility] ED.  Patient sats low in the 80s.  Upon arrival to [facility] ED on 7/28, patient sats improved on 6L [place].  Hemodynamically stable.  Patient Aox4. CXR showing LLL atelectasis. CT chest showing LLL consolidation. Azithromycin/rocephin given. Troponin wnl. BNP 1,014. ABG initially 7.34, 45, 63, 25. Covid/flu negative. Na 132, K 7.5, creat 3.3, bun 51, ammonia 43. Patient began to have worsening resp distress and was placed on bipap. Given calcium, insulin/dextrose, albuterol, lasix. Patient mental status worsening on bipap. Repeat abg 7.25, 58, 71, 25. Patient arousable but remains lethargic. PCCM consulted.  Pertinent  Medical History   Past Medical History:  Diagnosis Date   CHF (congestive heart failure) ([place])    Diabetes ([place])    Hypertension    Inflammatory myopathy    treated 2012   Pulmonary hypertension ([place])    Wears glasses    reading    Significant Hospital Events: Including procedures, antibiotic start and stop dates in addition to other pertinent events   7/28 admitted [facility] resp failure on bipap  Interim History / Subjective:  Started on CRRT yesterday, remains net negative On heated high flow, titrated down to 20 L and 45%  Objective   Blood pressure 98/72, pulse 91, temperature 97.6 F (36.4 C), temperature source Oral, resp. rate (!) 21, height 5'  (1.524 m), weight 95 kg, SpO2 90 %. CVP:  [20 mmHg-43 mmHg] 30 mmHg  FiO2 (%):  [60 %-70 %] 60 %   Intake/Output Summary (Last 24 hours) at 11/24/2021 1040 Last data filed at 11/24/2021 1000 Gross per 24 hour  Intake 2086.17 ml  Output 3091 ml  Net -1004.83 ml   Filed Weights   11/22/21 0415 11/23/21 0355 11/24/21 0600  Weight: 89.3 kg 89.4 kg 95 kg    Examination: Physical exam: General: Acute on chronically ill-appearing female, lying on the bed HEENT: [place]/AT, eyes anicteric.  moist mucus membranes Neuro: Alert, awake following commands Chest: Bilateral basal crackles all over, no wheezes Heart: Regular rate and rhythm, no murmurs or gallops.  2+ pitting edema in bilateral lower extremities Abdomen: Soft, nontender, nondistended, bowel sounds present Skin: No rash  COOx 69% on dobutamine 6  Resolved Hospital Problem list     Assessment & Plan:  Acute on chronic hypoxemic and hypercarbic respiratory failure due to decompensated right heart failure and possible LLL CAP Still remain high risk for progression to intubation Off BiPAP for 24 hours On high flow nasal cannula oxygen, titrated down to 20 L and 45% Encourage incentive spirometry Continue ceftriaxone and azithromycin  Acute kidney injury on CKD stage IIIa- most c/w cardiorenal Started on CRRT yesterday as she was not making much urine Monitor intake and output  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 with acute on chronic right ventricular failure, with restrictive lung disease, OSA follow by heart failure clinic  Diuretic/inotrope titration per advanced heart failure team Target pulmonary vasodilators as ordered  Hypervolemic hyponatremia due to right-sided heart failure Closely monitor electrolytes   Best Practice (right click and "Reselect all SmartList Selections" daily)   Diet/type: Regular consistency DVT prophylaxis: prophylactic heparin  GI prophylaxis: N/A Lines: N/A Foley:  N/A Code Status:  full code Last  date of multidisciplinary goals of care discussion [7/31 spoke with sister and updated at bedside; would like [place] to remain full code.]  Total critical care time: 32 minutes  Performed by: [facility] care time was exclusive of separately billable procedures and treating other patients.   Critical care was necessary to treat or prevent imminent or life-threatening deterioration.   Critical care was time spent personally by me on the following activities: development of treatment plan with patient and/or surrogate as well as nursing, discussions with consultants, evaluation of patient's response to treatment, examination of patient, obtaining history from patient or surrogate, ordering and performing treatments and interventions, ordering and review of laboratory studies, ordering and review of radiographic studies, pulse oximetry and re-evaluation of patient's condition.   Jacky Kindle, MD [facility] See Amion for pager If no response to pager, please call [phone] until 7pm After 7pm, Please call E-link [phone] bradycardia, tingling

## 2021-11-24 DEATH — deceased

## 2021-11-25 ENCOUNTER — Inpatient Hospital Stay (HOSPITAL_COMMUNITY): Payer: 59

## 2021-11-25 ENCOUNTER — Other Ambulatory Visit (HOSPITAL_COMMUNITY): Payer: 59

## 2021-11-25 DIAGNOSIS — R570 Cardiogenic shock: Secondary | ICD-10-CM | POA: Diagnosis not present

## 2021-11-25 DIAGNOSIS — I50810 Right heart failure, unspecified: Secondary | ICD-10-CM | POA: Diagnosis not present

## 2021-11-25 DIAGNOSIS — N179 Acute kidney failure, unspecified: Secondary | ICD-10-CM | POA: Diagnosis not present

## 2021-11-25 DIAGNOSIS — J9601 Acute respiratory failure with hypoxia: Secondary | ICD-10-CM | POA: Diagnosis not present

## 2021-11-25 DIAGNOSIS — E875 Hyperkalemia: Secondary | ICD-10-CM | POA: Diagnosis not present

## 2021-11-25 LAB — POCT I-STAT 7, (LYTES, BLD GAS, ICA,H+H)
Acid-base deficit: 3 mmol/L — ABNORMAL HIGH (ref 0.0–2.0)
Bicarbonate: 24.5 mmol/L (ref 20.0–28.0)
Calcium, Ion: 1.15 mmol/L (ref 1.15–1.40)
HCT: 38 % (ref 36.0–46.0)
Hemoglobin: 12.9 g/dL (ref 12.0–15.0)
O2 Saturation: 92 %
Patient temperature: 97.6
Potassium: 4.7 mmol/L (ref 3.5–5.1)
Sodium: 133 mmol/L — ABNORMAL LOW (ref 135–145)
TCO2: 26 mmol/L (ref 22–32)
pCO2 arterial: 53.6 mmHg — ABNORMAL HIGH (ref 32–48)
pH, Arterial: 7.266 — ABNORMAL LOW (ref 7.35–7.45)
pO2, Arterial: 73 mmHg — ABNORMAL LOW (ref 83–108)

## 2021-11-25 LAB — CBC
HCT: 29.1 % — ABNORMAL LOW (ref 36.0–46.0)
HCT: 30.7 % — ABNORMAL LOW (ref 36.0–46.0)
Hemoglobin: 10 g/dL — ABNORMAL LOW (ref 12.0–15.0)
Hemoglobin: 10.6 g/dL — ABNORMAL LOW (ref 12.0–15.0)
MCH: 28.4 pg (ref 26.0–34.0)
MCH: 28.4 pg (ref 26.0–34.0)
MCHC: 34.4 g/dL (ref 30.0–36.0)
MCHC: 34.5 g/dL (ref 30.0–36.0)
MCV: 82.7 fL (ref 80.0–100.0)
MCV: 82.3 fL (ref 80.0–100.0)
Platelets: 150 10*3/uL (ref 150–400)
Platelets: 143 10*3/uL — ABNORMAL LOW (ref 150–400)
RBC: 3.52 MIL/uL — ABNORMAL LOW (ref 3.87–5.11)
RBC: 3.73 MIL/uL — ABNORMAL LOW (ref 3.87–5.11)
RDW: 18.4 % — ABNORMAL HIGH (ref 11.5–15.5)
RDW: 18.7 % — ABNORMAL HIGH (ref 11.5–15.5)
WBC: 7.8 10*3/uL (ref 4.0–10.5)
WBC: 8 10*3/uL (ref 4.0–10.5)
nRBC: 1.7 % — ABNORMAL HIGH (ref 0.0–0.2)
nRBC: 1.9 % — ABNORMAL HIGH (ref 0.0–0.2)

## 2021-11-25 LAB — CULTURE, BLOOD (ROUTINE X 2)
Culture: NO GROWTH
Culture: NO GROWTH
Special Requests: ADEQUATE

## 2021-11-25 LAB — COOXEMETRY PANEL
Carboxyhemoglobin: 1.5 % (ref 0.5–1.5)
Carboxyhemoglobin: 0.8 % (ref 0.5–1.5)
Carboxyhemoglobin: 1.2 % (ref 0.5–1.5)
Carboxyhemoglobin: 1.4 % (ref 0.5–1.5)
Methemoglobin: 1.2 % (ref 0.0–1.5)
Methemoglobin: 0.7 % (ref 0.0–1.5)
Methemoglobin: <0.7 % (ref 0.0–1.5)
Methemoglobin: <0.7 % (ref 0.0–1.5)
O2 Saturation: 70 %
O2 Saturation: 40.8 %
O2 Saturation: 41.8 %
O2 Saturation: 51.3 %
Total hemoglobin: 10.6 g/dL — ABNORMAL LOW (ref 12.0–16.0)
Total hemoglobin: 10.9 g/dL — ABNORMAL LOW (ref 12.0–16.0)
Total hemoglobin: 11.2 g/dL — ABNORMAL LOW (ref 12.0–16.0)
Total hemoglobin: 10.8 g/dL — ABNORMAL LOW (ref 12.0–16.0)

## 2021-11-25 LAB — LACTIC ACID, PLASMA
Lactic Acid, Venous: 1.8 mmol/L (ref 0.5–1.9)
Lactic Acid, Venous: 1.5 mmol/L (ref 0.5–1.9)

## 2021-11-25 LAB — RENAL FUNCTION PANEL
Albumin: 3.1 g/dL — ABNORMAL LOW (ref 3.5–5.0)
Anion gap: 7 (ref 5–15)
BUN: 17 mg/dL (ref 6–20)
CO2: 26 mmol/L (ref 22–32)
Calcium: 8.3 mg/dL — ABNORMAL LOW (ref 8.9–10.3)
Chloride: 100 mmol/L (ref 98–111)
Creatinine, Ser: 1.28 mg/dL — ABNORMAL HIGH (ref 0.44–1.00)
GFR, Estimated: 50 mL/min — ABNORMAL LOW (ref >60)
Glucose, Bld: 104 mg/dL — ABNORMAL HIGH (ref 70–99)
Phosphorus: 2.3 mg/dL — ABNORMAL LOW (ref 2.5–4.6)
Potassium: 4.6 mmol/L (ref 3.5–5.1)
Sodium: 133 mmol/L — ABNORMAL LOW (ref 135–145)

## 2021-11-25 LAB — BASIC METABOLIC PANEL
Anion gap: 8 (ref 5–15)
BUN: 12 mg/dL (ref 6–20)
CO2: 27 mmol/L (ref 22–32)
Calcium: 8.4 mg/dL — ABNORMAL LOW (ref 8.9–10.3)
Chloride: 98 mmol/L (ref 98–111)
Creatinine, Ser: 1.21 mg/dL — ABNORMAL HIGH (ref 0.44–1.00)
GFR, Estimated: 54 mL/min — ABNORMAL LOW (ref >60)
Glucose, Bld: 110 mg/dL — ABNORMAL HIGH (ref 70–99)
Potassium: 4.5 mmol/L (ref 3.5–5.1)
Sodium: 133 mmol/L — ABNORMAL LOW (ref 135–145)

## 2021-11-25 LAB — GLUCOSE, CAPILLARY
Glucose-Capillary: 100 mg/dL — ABNORMAL HIGH (ref 70–99)
Glucose-Capillary: 110 mg/dL — ABNORMAL HIGH (ref 70–99)
Glucose-Capillary: 121 mg/dL — ABNORMAL HIGH (ref 70–99)
Glucose-Capillary: 122 mg/dL — ABNORMAL HIGH (ref 70–99)
Glucose-Capillary: 93 mg/dL (ref 70–99)
Glucose-Capillary: 95 mg/dL (ref 70–99)
Glucose-Capillary: 95 mg/dL (ref 70–99)

## 2021-11-25 LAB — PHOSPHORUS: Phosphorus: 2.5 mg/dL (ref 2.5–4.6)

## 2021-11-25 LAB — MAGNESIUM
Magnesium: 2.2 mg/dL (ref 1.7–2.4)
Magnesium: 2.4 mg/dL (ref 1.7–2.4)

## 2021-11-25 MED ORDER — SENNOSIDES-DOCUSATE SODIUM 8.6-50 MG PO TABS
1.0000 | ORAL_TABLET | Freq: Two times a day (BID) | ORAL | Status: DC
  Administered 2021-11-25: 1

## 2021-11-25 MED ORDER — ORAL CARE MOUTH RINSE: 15.0000 mL | OROMUCOSAL | Status: DC

## 2021-11-25 MED ORDER — FAMOTIDINE 20 MG PO TABS: 20.0000 mg | ORAL_TABLET | Freq: Every day | ORAL | Status: DC

## 2021-11-25 MED ORDER — ORAL CARE MOUTH RINSE: 15.0000 mL | OROMUCOSAL | Status: DC | PRN

## 2021-11-25 MED ORDER — FAMOTIDINE 20 MG PO TABS
20.0000 mg | ORAL_TABLET | Freq: Every day | ORAL | Status: DC
  Administered 2021-11-25: 20 mg via ORAL
  Filled 2021-11-25: qty 1

## 2021-11-25 MED ORDER — SILDENAFIL CITRATE 20 MG PO TABS
20.0000 mg | ORAL_TABLET | Freq: Three times a day (TID) | ORAL | Status: DC
  Administered 2021-11-25: 20 mg
  Filled 2021-11-25 (×2): qty 1

## 2021-11-25 MED ORDER — DOCUSATE SODIUM 50 MG/5ML PO LIQD: 100.0000 mg | Freq: Two times a day (BID) | ORAL | Status: DC | PRN

## 2021-11-25 MED ORDER — ORAL CARE MOUTH RINSE
15.0000 mL | OROMUCOSAL | Status: DC | PRN
Start: 2021-11-25 — End: 2021-11-26

## 2021-11-25 MED ORDER — SORBITOL 70 % SOLN
30.0000 mL | Freq: Once | Status: AC
  Administered 2021-11-25: 30 mL via ORAL
  Filled 2021-11-25: qty 30

## 2021-11-25 MED ORDER — MIDODRINE HCL 5 MG PO TABS
15.0000 mg | ORAL_TABLET | Freq: Three times a day (TID) | ORAL | Status: DC
Start: 2021-11-25 — End: 2021-11-26
  Administered 2021-11-26: 15 mg
  Filled 2021-11-25: qty 3

## 2021-11-25 MED ORDER — METOCLOPRAMIDE HCL 5 MG/ML IJ SOLN
10.0000 mg | Freq: Four times a day (QID) | INTRAMUSCULAR | Status: AC
Start: 2021-11-25 — End: 2021-11-26
  Administered 2021-11-25 – 2021-11-26 (×4): 10 mg via INTRAVENOUS
  Filled 2021-11-25 (×4): qty 2

## 2021-11-25 NOTE — Progress Notes (Signed)
Assessment/Plan:56 year old BF with severe pulmonary HTN and chronic volume overload-  presents with A on CRF and hyperkalemia in the setting of what appears to be decompensated right heart failure  1.Renal- CKD as an OP which has been worsening over the last few months in the setting of escalating diuresis.  Renal ultrasound shows a  ? small left kidney but no other issues, urine is bland.  This all appears to be a cardiorenal phenomenon.   Her potassium has come down with just holding her repletion but the main issue is volume overload.  I have discussed with Dr. [name] and aggressive attempt at diuresis with suboptimal response. Will initiate CRRT but not a good long term iHD candidate.  She is so young so reasonable to give a trial of CRRT to normalize volume and see if her kidneys can recover.  If they do not this is a difficult situation as intermittent dialysis in the setting of severe pulmonary HTN and right heart failure; these patients do not tolerate iHD.  Seen on CRRT with UF 100-200 (currently at 200) -> net neg 7.16L; currently on Dobutamine 2.5 + Levophed 7 + midodrine. CVP still 28-30.  On 4K baths and electrolytes look fine; no changes to replacement or dialysate. Monitoring closely as she will dependent on preload as well but on PE e/o still marked fluid overload (but there is improvement on PE with decr edema c/w a few days ago)  2. Hyperkalemia-  due to AKI and high dose potassium repletion.  She has had hyperkalemia for several hours while here in the ER and has not had any arythmia or hemodynamic instability-  because dialysis is not tolerated in these types of patients and she was on 100 meq of potassium daily -  holding supps and treat medically -   is now WNL 3. Hypertension/volume (on low side now with aggressive UF) - clearly volume overloaded but very difficult to diurese also in patients with right sided heart failure-  per CCM and cards- plan was for lasix drip/metolazone but  suboptimal response and now  giving trial of CRRT  4. Pulmonary-  possible PNA/atelectasis-  ABG showing resp acidosis-  now on bipap with improvement-  per CCM-  on recephin and azithro  5. Anemia  - not a major issue at this time   Calil Amor W   Subjective:  fairly stable overnight-  tolerating UF 200 now on Dobutamine and Levophed + Midodrine.  CVP still reading high (28-30). She feels the breathing is improving.  Objective Vital signs in last 24 hours: Vitals:   11/25/21 0900 11/25/21 1000 11/25/21 1100 11/25/21 1104  BP: 91/71 (!) 71/61 (!) 88/68   Pulse: 87  (!) 45 (!) 44  Resp: (!) 22 17 (!) 27 (!) 24  Temp:      TempSrc:      SpO2: (!) 88% (!) 89% (!) 88% (!) 85%  Weight:      Height:       Weight change: -7.7 kg  Intake/Output Summary (Last 24 hours) at 11/25/2021 1149 Last data filed at 11/25/2021 1100 Gross per 24 hour  Intake 2163.15 ml  Output 6782.7 ml  Net -4619.55 ml       Labs: Basic Metabolic Panel: Recent Labs  Lab 11/24/21 0405 11/24/21 1600 11/25/21 0434  NA 132* 131* 133*  K 4.0 4.5 4.6  CL 98 96* 100  CO2 _0 GLUCOSE 96 175* 104*  BUN 30* 22* 17  CREATININE 1.74* 1.53* 1.28*  CALCIUM 8.0* 8.4* 8.3*  PHOS 3.0 2.8 2.3*   Liver Function Tests: Recent Labs  Lab 11/08/2021 1733 10/24/2021 2047 11/21/21 0307 11/23/21 1708 11/24/21 0405 11/24/21 1600 11/25/21 0434  AST _0 --   --   --   --   ALT _1 --   --   --   --   ALKPHOS 128* 135* 111  --   --   --   --   BILITOT 3.9* 3.7* 3.0*  --   --   --   --   PROT 8.2* 8.5* 6.9  --   --   --   --   ALBUMIN 3.6 3.7 3.0*   < > 2.9* 3.2* 3.1*   < > = values in this interval not displayed.   No results for input(s): "LIPASE", "AMYLASE" in the last 168 hours. Recent Labs  Lab 11/11/2021 2047 11/21/21 0346  AMMONIA 43* 41*   CBC: Recent Labs  Lab 11/21/2021 1450 11/05/2021 1530 11/05/2021 2305 11/21/21 0100 11/21/21 0307 11/21/21 0518 11/23/21 0354 11/24/21 0405  11/25/21 0434  WBC 7.2  --  7.8  --  6.9  --  6.9 7.3 7.8  NEUTROABS 5.0  --   --   --   --   --   --   --   --   HGB 11.9*   < > 11.8*   < > 10.9*   < > 10.1* 9.8* 10.0*  HCT 35.1*   < > 34.1*   < > 31.6*   < > 29.4* 28.9* 29.1*  MCV 82.0  --  82.4  --  81.4  --  82.4 82.8 82.7  PLT 317  --  249  --  228  --  205 152 150   < > = values in this interval not displayed.   Cardiac Enzymes: Recent Labs  Lab 11/21/21 1129  CKTOTAL 62   CBG: Recent Labs  Lab 11/24/21 1532 11/24/21 2049 11/25/21 0048 11/25/21 0423 11/25/21 0703  GLUCAP 130* 168* 121* 95 93    Iron Studies: No results for input(s): "IRON", "TIBC", "TRANSFERRIN", "FERRITIN" in the last 72 hours. Studies/Results: DG CHEST PORT 1 VIEW  Result Date: 11/23/2021 CLINICAL DATA:  Tunneled central venous catheter insertion EXAM: PORTABLE CHEST 1 VIEW COMPARISON:  Portable exam 1219 hours compared to 0816 hours FINDINGS: LEFT subclavian line with tip projecting over RIGHT atrium unchanged. New RIGHT jugular central venous catheter with tip projecting over RIGHT atrium. Markedly decreased lung volumes with significant bibasilar atelectasis. No pneumothorax. Heart appears enlarged. IMPRESSION: No pneumothorax following RIGHT jugular line placement. Markedly decreased lung volumes with bibasilar atelectasis. Electronically Signed   By: Lavonia Dana M.D.   On: 11/23/2021 12:30   Medications: Infusions:   prismasol BGK 4/2.5 500 mL/hr at 11/25/21 0610    prismasol BGK 4/2.5 300 mL/hr at 11/25/21 0013   sodium chloride     cefTRIAXone (ROCEPHIN)  IV Stopped (11/24/21 2150)   DOBUTamine 2.5 mcg/kg/min (11/25/21 1100)   norepinephrine (LEVOPHED) Adult infusion 7 mcg/min (11/25/21 1100)   prismasol BGK 4/2.5 1,500 mL/hr at 11/25/21 9390    Scheduled Medications:  Chlorhexidine Gluconate Cloth  6 each Topical Daily   famotidine  20 mg Oral Daily   heparin  5,000 Units Subcutaneous Q8H   insulin aspart  0-6 Units Subcutaneous Q4H    macitentan  10 mg Oral Daily   midodrine  15 mg Oral  TID WC   mouth rinse  15 mL Mouth Rinse 4 times per day   polyethylene glycol  17 g Oral Daily   senna-docusate  1 tablet Oral BID   sildenafil  20 mg Oral TID    have reviewed scheduled and prn medications.  Physical Exam: General:  alert-  on HFNC Heart:tachy Lungs: decr movement, coarse rales present Abdomen: non tender Extremities: pitting edema all the way to the thighs Dialysis Access: temp cath working well, no oozing     11/25/2021,11:49 AM  LOS: 5 days

## 2021-11-25 NOTE — Progress Notes (Signed)
NAME:  Shannon [name], MRN:  [id number], DOB:  Jun 15, 1965, LOS: 5 ADMISSION DATE:  11/13/2021, CONSULTATION DATE:  7/28 REFERRING MD:  Dr. Regenia Skeeter, CHIEF COMPLAINT:  chf exacerbation   History of Present Illness:  Patient is a 56 year old female with pertinent PMH HTN, DMT2, severe pulmonary HTN followed by [name] presents to [facility] ED on 7/28 with SOB.  Patient has been experiencing weakness for the past several days and having trouble walking without getting SOB.  Today on 7/28 patient was more confused per family member.  Also was having trouble breathing.  Patient wears 2L [place] at home.  EMS called and transported patient to [facility] ED.  Patient sats low in the 80s.  Upon arrival to [facility] ED on 7/28, patient sats improved on 6L [place].  Hemodynamically stable.  Patient Aox4. CXR showing LLL atelectasis. CT chest showing LLL consolidation. Azithromycin/rocephin given. Troponin wnl. BNP 1,014. ABG initially 7.34, 45, 63, 25. Covid/flu negative. Na 132, K 7.5, creat 3.3, bun 51, ammonia 43. Patient began to have worsening resp distress and was placed on bipap. Given calcium, insulin/dextrose, albuterol, lasix. Patient mental status worsening on bipap. Repeat abg 7.25, 58, 71, 25. Patient arousable but remains lethargic. PCCM consulted.  Pertinent  Medical History   Past Medical History:  Diagnosis Date   CHF (congestive heart failure) ([place])    Diabetes ([place])    Hypertension    Inflammatory myopathy    treated 2012   Pulmonary hypertension ([place])    Wears glasses    reading    Significant Hospital Events: Including procedures, antibiotic start and stop dates in addition to other pertinent events   7/28 admitted [facility] resp failure on bipap  Interim History / Subjective:  Patient vomited this afternoon, since then she has been shaking with change in mental status  Objective   Blood pressure 110/70, pulse (!) 47, temperature (!) 97.3 F (36.3 C), temperature source Axillary, resp. rate 20, height  5' (1.524 m), weight 87.3 kg, SpO2 (!) 83 %. CVP:  [28 mmHg-40 mmHg] 28 mmHg  FiO2 (%):  [40 %-70 %] 50 %   Intake/Output Summary (Last 24 hours) at 11/25/2021 1501 Last data filed at 11/25/2021 1400 Gross per 24 hour  Intake 2208.1 ml  Output 7080.7 ml  Net -4872.6 ml   Filed Weights   11/23/21 0355 11/24/21 0600 11/25/21 0600  Weight: 89.4 kg 95 kg 87.3 kg    Examination: Physical exam: General: Acute on chronically ill-appearing female, lying on the bed HEENT: [place]/AT, eyes anicteric.  moist mucus membranes Neuro: Lethargic, opens eyes with vocal stimuli, intermittently following few commands Chest: Bilateral basal crackles all over, no wheezes Heart: Regular rate and rhythm, no murmurs or gallops.  2+ pitting edema in bilateral lower extremities Abdomen: Soft, nontender, nondistended, bowel sounds present Skin: No rash  COOx dropped to 40% on dobutamine 7  Resolved Hospital Problem list     Assessment & Plan:  Acute on chronic hypoxemic and hypercarbic respiratory failure due to decompensated right heart failure and possible LLL CAP Still remain very high risk for progression to intubation Off BiPAP for 24 hours On high flow nasal cannula oxygen, titrated down to 20 L and 45% We will get ABG Encourage incentive spirometry Continue ceftriaxone and azithromycin  Acute metabolic encephalopathy could be related to hypoxia and low flow state Patient mental status has changed since she vomited He received Zofran, still feeling nauseous She is lethargic but opens eyes with vocal stimuli We will get  ABGs and lactate  Nausea and vomiting Patient vomited 600 cc of H&H remained stable She received Zofran Started on Reglan Keep n.p.o. 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 with acute on chronic right ventricular failure, with  restrictive lung disease, OSA follow by heart failure clinic Diuretic/inotrope titration per advanced heart failure team Target pulmonary vasodilators as ordered  Hypervolemic hyponatremia due to right-sided heart failure Closely monitor electrolytes   Best Practice (right click and "Reselect all SmartList Selections" daily)   Diet/type: NPO DVT prophylaxis: prophylactic heparin  GI prophylaxis: N/A Lines: N/A Foley:  N/A Code Status:  full code Last date of multidisciplinary goals of care discussion [7/31 spoke with sister and updated at bedside; would like [place] to remain full code.]  Total critical care time: 39 minutes  Performed by: Jacky Kindle   Critical care time was exclusive of separately billable procedures and treating other patients.   Critical care was necessary to treat or prevent imminent or life-threatening deterioration.   Critical care was time spent personally by me on the following activities: development of treatment plan with patient and/or surrogate as well as nursing, discussions with consultants, evaluation of patient's response to treatment, examination of patient, obtaining history from patient or surrogate, ordering and performing treatments and interventions, ordering and review of laboratory studies, ordering and review of radiographic studies, pulse oximetry and re-evaluation of patient's condition.   Jacky Kindle, MD [facility] See Amion for pager If no response to pager, please call [phone] until 7pm After 7pm, Please call E-link [phone] bradycardia, tingling

## 2021-11-25 NOTE — Progress Notes (Addendum)
Patient ID: Shannon [name], female   DOB: 03-12-1966, 56 y.o.   MRN: [id number]     Advanced Heart Failure Rounding Note  PCP-Cardiologist: Fransico Him, MD   Subjective:    7/31: started on CRRT 8/1: NE added for BP support to facilitate CRRT volume removal    On DBA 2.5 + NE 7 + midodrine 15 mg tid. Co-ox 70%.   Anuric. 6.2 L fluid removal off CRRT yesterday. Currently running at 200 cc/hr  Wt down 17 lb. CVP still high ~28. On 20 L HFNC.   OOB, sitting up in chair. Constipation. No BP in several days.    Echo: EF 60-65%, D-shaped septum, moderate RV enlargement/moderate RV dysfunction, PASP 59, mod-severe TR, IVC dilated.    Objective:   Weight Range: 87.3 kg Body mass index is 37.59 kg/m.   Vital Signs:   Temp:  [97.2 F (36.2 C)-98.1 F (36.7 C)] 97.6 F (36.4 C) (08/02 0722) Pulse Rate:  [43-114] 87 (08/02 0735) Resp:  [14-34] 21 (08/02 0800) BP: (80-98)/(53-79) 92/68 (08/02 0800) SpO2:  [87 %-98 %] 93 % (08/02 0800) FiO2 (%):  [40 %-50 %] 40 % (08/02 0735) Weight:  [87.3 kg] 87.3 kg (08/02 0600) Last BM Date : 11/22/21  Weight change: Filed Weights   11/23/21 0355 11/24/21 0600 11/25/21 0600  Weight: 89.4 kg 95 kg 87.3 kg    Intake/Output:   Intake/Output Summary (Last 24 hours) at 11/25/2021 0818 Last data filed at 11/25/2021 0800 Gross per 24 hour  Intake 1519.08 ml  Output 6077 ml  Net -4557.92 ml      Physical Exam    CVP 28  General:  obese, sitting up in chair. Fatigued appearing No respiratory difficulty HEENT: normal Neck: supple. JVD elevated to jaw. + HD cath rt IJ, Carotids 2+ bilat; no bruits. No lymphadenopathy or thyromegaly appreciated. Cor: PMI nondisplaced. Regular rate & rhythm. No rubs, gallops or murmurs. Lungs: decreased BS at the bases  Abdomen: obese, +distended. Hypoactive BS. No hepatosplenomegaly. No bruits or masses. Good bowel sounds. Extremities: no cyanosis, clubbing, rash, 1+ b/l LE edema + TED hoses  Neuro:  alert & oriented x 3, cranial nerves grossly intact. moves all 4 extremities w/o difficulty. Affect pleasant.   Telemetry   Sinus tachy 100s (personally reviewed)  Labs    CBC Recent Labs    11/24/21 0405 11/25/21 0434  WBC 7.3 7.8  HGB 9.8* 10.0*  HCT 28.9* 29.1*  MCV 82.8 82.7  PLT 152 742   Basic Metabolic Panel Recent Labs    11/24/21 0405 11/24/21 1600 11/25/21 0434  NA 132* 131* 133*  K 4.0 4.5 4.6  CL 98 96* 100  CO2 _0 GLUCOSE 96 175* 104*  BUN 30* 22* 17  CREATININE 1.74* 1.53* 1.28*  CALCIUM 8.0* 8.4* 8.3*  MG 2.0  --  2.2  PHOS 3.0 2.8 2.3*   Liver Function Tests Recent Labs    11/24/21 1600 11/25/21 0434  ALBUMIN 3.2* 3.1*   No results for input(s): "LIPASE", "AMYLASE" in the last 72 hours. Cardiac Enzymes No results for input(s): "CKTOTAL", "CKMB", "CKMBINDEX", "TROPONINI" in the last 72 hours.   BNP: BNP (last 3 results) Recent Labs    09/10/21 1614 11/11/21 1620 10/27/2021 1510  BNP 916.7* 1,063.1* 1,014.0*    ProBNP (last 3 results) No results for input(s): "PROBNP" in the last 8760 hours.   D-Dimer No results for input(s): "DDIMER" in the last 72 hours. Hemoglobin A1C No results  for input(s): "HGBA1C" in the last 72 hours.  Fasting Lipid Panel No results for input(s): "CHOL", "HDL", "LDLCALC", "TRIG", "CHOLHDL", "LDLDIRECT" in the last 72 hours. Thyroid Function Tests No results for input(s): "TSH", "T4TOTAL", "T3FREE", "THYROIDAB" in the last 72 hours.  Invalid input(s): "FREET3"  Other results:   Imaging    No results found.   Medications:     Scheduled Medications:  Chlorhexidine Gluconate Cloth  6 each Topical Daily   heparin  5,000 Units Subcutaneous Q8H   insulin aspart  0-6 Units Subcutaneous Q4H   macitentan  10 mg Oral Daily   midodrine  15 mg Oral TID WC   mouth rinse  15 mL Mouth Rinse 4 times per day   polyethylene glycol  17 g Oral Daily   senna-docusate  1 tablet Oral BID   sildenafil   20 mg Oral TID    Infusions:   prismasol BGK 4/2.5 500 mL/hr at 11/25/21 0610    prismasol BGK 4/2.5 300 mL/hr at 11/25/21 0013   sodium chloride     cefTRIAXone (ROCEPHIN)  IV Stopped (11/24/21 2150)   DOBUTamine 2.5 mcg/kg/min (11/25/21 0800)   norepinephrine (LEVOPHED) Adult infusion 7 mcg/min (11/25/21 0800)   prismasol BGK 4/2.5 1,500 mL/hr at 11/25/21 0406    PRN Medications: Place/Maintain arterial line **AND** sodium chloride, docusate sodium, heparin, ondansetron (ZOFRAN) IV, mouth rinse, polyethylene glycol, simethicone, sodium chloride, white petrolatum    Assessment/Plan   1. Pulmonary hypertension/RV failure: Echo in 11/22 showed EF 60-65%, mild LVH, severe RV enlargement with severely decreased RV systolic function, PASP 66 mmHg, severe RAE, moderate TR, moderate PR, small pericardial effusion. CTA chest showed no PE, normal lung fields and V/Q scan showed no evidence for chronic PE.  PFTs showed severe restriction and decreased DLCO.  RHC/LHC showed severe pulmonary arterial HTN with RV failure.  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  No history of liver disease.  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  ANA, SCL-70, anti-centromere antibody, and RF negative with elevated CRP. She may have connective tissue disease-related PAH though she saw rheumatology and no definite diagnosis was given.  High resolution CT chest showed no evidence for ILD.  She was admitted this time with worsening RV failure and AKI. Echo this admission with EF 60-65%, D-shaped septum, moderate RV enlargement/moderate RV dysfunction, PASP 59, mod-severe TR, IVC dilated. CVP is still 30 this morning, marked volume overload on exam.  She was started on dobutamine 2.5 for RV support (currently running at 6 mgc/kg/min, titratable order) co-ox 69% this morning. Oliguric despite high dose lasix gtt at 30/hr. Started  on CRRT 7/31 for retractable volume overload. Currently pulling 200 cc/hr. 6L pulled yesterday, wt down 17 lb. CVP still markedly elevated at 28  - Continue DBA to 2.5 for RV support (co-ox 70%). Continue NE for BP support to allow volume removal off CRRT  - Continue midodrine 15 tid.  - Continue Opsumit 10 mg daily.  - Transitioned tadalafil to sildenafil with AKI, will use 20 mg tid.  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 She does not report myalgias or muscle pain.  Myositis panel was negative.  She saw rheumatology and no definite diagnosis was given. CK normal at 62 this admission.  3. Acute on chronic hypoxemic respiratory failure: On 2L home oxygen at baseline, currently on HFNC (initially on Bipap).  - c/w abx for CAP per PCCM  4. AKI on CKD stage 3: Creatinine had been slowly rising at home with diuresis, suspect cardiorenal syndrome.  However, up to 3.3 at admission with marked hyperkalemia. No arrhythmias. K now normal. Anuric.  -  Continue trial of CVVH to see if we can decongest her then titrate off the CVVH. Would likely tolerate iHD poorly with severe RV failure. - Nephrology following. Appreciate their assistance  5. ID: CT chest with LLL infiltrate.  - Ceftriaxone/azithromycin.  6. Constipation  - Order sorbitol   Length of Stay: [address] PA-C  11/25/2021, 8:18 AM  Advanced Heart Failure Team Pager [phone] (M-F; 7a - 5p)  Please contact [facility] for night-coverage after hours (5p -7a ) and weekends on [email or web address]   Patient seen with PA, agree with the above note.   Weight coming down nicely with CVVH, CVP still 25+.  Pulling UF 200 cc/hr net.  On dobutamine 2.5 + NE 7 and midodrine.   Breathing better.   General: NAD Neck: JVP 16 cm, no thyromegaly or thyroid nodule.  Lungs: Clear to  auscultation bilaterally with normal respiratory effort. CV: Nondisplaced PMI.  Heart regular S1/S2, no S3/S4, no murmur.  2+ edema to knees.  Abdomen: Soft, nontender, no hepatosplenomegaly, no distention.  Skin: Intact without lesions or rashes.  Neurologic: Alert and oriented x 3.  Psych: Normal affect. Extremities: No clubbing or cyanosis.  HEENT: Normal.   Still marked RV volume overload but improving.  Continue CVVH with UF at 200 cc/hr.  Continue current dobutamine and NE. Long-term, very concerned about her prognosis.  Do not think she would tolerate iHD well.  Best hope is that fluid removal and lowering of markedly high preload leads to improved RV hemodynamics and renal function.   CRITICAL CARE Performed by: Loralie Champagne  Total critical care time: 35 minutes  Critical care time was exclusive of separately billable procedures and treating other patients.  Critical care was necessary to treat or prevent imminent or life-threatening deterioration.  Critical care was time spent personally by me on the following activities: development of treatment plan with patient and/or surrogate as well as nursing, discussions with consultants, evaluation of patient's response to treatment, examination of patient, obtaining history from patient or surrogate, ordering and performing treatments and interventions, ordering and review of laboratory studies, ordering and review of radiographic studies, pulse oximetry and re-evaluation of patient's condition.  Loralie Champagne 11/25/2021 8:43 AM

## 2021-11-25 NOTE — Plan of Care (Signed)
  Problem: Clinical Measurements: Goal: Diagnostic test results will improve Outcome: Progressing Goal: Respiratory complications will improve Outcome: Progressing   Problem: Activity: Goal: Capacity to carry out activities will improve Outcome: Progressing   Problem: Cardiac: Goal: Ability to achieve and maintain adequate cardiopulmonary perfusion will improve Outcome: Progressing

## 2021-11-25 NOTE — Progress Notes (Signed)
Per Dr. Mali, Run CRRT EVEN and use Levo to keep MAP > 80 for profusion.

## 2021-11-26 ENCOUNTER — Inpatient Hospital Stay (HOSPITAL_COMMUNITY): Payer: 59

## 2021-11-26 DIAGNOSIS — E875 Hyperkalemia: Secondary | ICD-10-CM | POA: Diagnosis not present

## 2021-11-26 DIAGNOSIS — I50810 Right heart failure, unspecified: Secondary | ICD-10-CM | POA: Diagnosis not present

## 2021-11-26 DIAGNOSIS — R570 Cardiogenic shock: Secondary | ICD-10-CM | POA: Diagnosis not present

## 2021-11-26 DIAGNOSIS — J9601 Acute respiratory failure with hypoxia: Secondary | ICD-10-CM | POA: Diagnosis not present

## 2021-11-26 DIAGNOSIS — N179 Acute kidney failure, unspecified: Secondary | ICD-10-CM | POA: Diagnosis not present

## 2021-11-26 LAB — GLUCOSE, CAPILLARY
Glucose-Capillary: 114 mg/dL — ABNORMAL HIGH (ref 70–99)
Glucose-Capillary: 117 mg/dL — ABNORMAL HIGH (ref 70–99)
Glucose-Capillary: 120 mg/dL — ABNORMAL HIGH (ref 70–99)
Glucose-Capillary: 121 mg/dL — ABNORMAL HIGH (ref 70–99)
Glucose-Capillary: 139 mg/dL — ABNORMAL HIGH (ref 70–99)
Glucose-Capillary: 99 mg/dL (ref 70–99)

## 2021-11-26 LAB — RENAL FUNCTION PANEL
Albumin: 3 g/dL — ABNORMAL LOW (ref 3.5–5.0)
Albumin: 3.2 g/dL — ABNORMAL LOW (ref 3.5–5.0)
Anion gap: 8 (ref 5–15)
Anion gap: 12 (ref 5–15)
BUN: 8 mg/dL (ref 6–20)
BUN: 9 mg/dL (ref 6–20)
CO2: 25 mmol/L (ref 22–32)
CO2: 24 mmol/L (ref 22–32)
Calcium: 8.3 mg/dL — ABNORMAL LOW (ref 8.9–10.3)
Calcium: 8.3 mg/dL — ABNORMAL LOW (ref 8.9–10.3)
Chloride: 99 mmol/L (ref 98–111)
Chloride: 93 mmol/L — ABNORMAL LOW (ref 98–111)
Creatinine, Ser: 1 mg/dL (ref 0.44–1.00)
Creatinine, Ser: 0.91 mg/dL (ref 0.44–1.00)
GFR, Estimated: >60 mL/min (ref >60)
GFR, Estimated: >60 mL/min (ref >60)
Glucose, Bld: 102 mg/dL — ABNORMAL HIGH (ref 70–99)
Glucose, Bld: 142 mg/dL — ABNORMAL HIGH (ref 70–99)
Phosphorus: 2 mg/dL — ABNORMAL LOW (ref 2.5–4.6)
Phosphorus: 3.2 mg/dL (ref 2.5–4.6)
Potassium: 4.2 mmol/L (ref 3.5–5.1)
Potassium: 4.2 mmol/L (ref 3.5–5.1)
Sodium: 132 mmol/L — ABNORMAL LOW (ref 135–145)
Sodium: 129 mmol/L — ABNORMAL LOW (ref 135–145)

## 2021-11-26 LAB — COOXEMETRY PANEL
Carboxyhemoglobin: 2 % — ABNORMAL HIGH (ref 0.5–1.5)
Carboxyhemoglobin: 1.7 % — ABNORMAL HIGH (ref 0.5–1.5)
Methemoglobin: 0.7 % (ref 0.0–1.5)
Methemoglobin: 0.8 % (ref 0.0–1.5)
O2 Saturation: 70.6 %
O2 Saturation: 58.9 %
Total hemoglobin: 10.3 g/dL — ABNORMAL LOW (ref 12.0–16.0)
Total hemoglobin: 10.6 g/dL — ABNORMAL LOW (ref 12.0–16.0)

## 2021-11-26 LAB — CBC
HCT: 28.6 % — ABNORMAL LOW (ref 36.0–46.0)
Hemoglobin: 9.8 g/dL — ABNORMAL LOW (ref 12.0–15.0)
MCH: 28.6 pg (ref 26.0–34.0)
MCHC: 34.3 g/dL (ref 30.0–36.0)
MCV: 83.4 fL (ref 80.0–100.0)
Platelets: 123 10*3/uL — ABNORMAL LOW (ref 150–400)
RBC: 3.43 MIL/uL — ABNORMAL LOW (ref 3.87–5.11)
RDW: 18.6 % — ABNORMAL HIGH (ref 11.5–15.5)
WBC: 7 10*3/uL (ref 4.0–10.5)
nRBC: 0.6 % — ABNORMAL HIGH (ref 0.0–0.2)

## 2021-11-26 LAB — PHOSPHORUS: Phosphorus: 2.7 mg/dL (ref 2.5–4.6)

## 2021-11-26 LAB — MAGNESIUM: Magnesium: 2.5 mg/dL — ABNORMAL HIGH (ref 1.7–2.4)

## 2021-11-26 MED ORDER — ORAL CARE MOUTH RINSE: 15.0000 mL | OROMUCOSAL | Status: DC | PRN

## 2021-11-26 MED ORDER — SODIUM PHOSPHATES 45 MMOLE/15ML IV SOLN
45.0000 mmol | Freq: Once | INTRAVENOUS | Status: AC
  Administered 2021-11-26: 45 mmol via INTRAVENOUS
  Filled 2021-11-26: qty 15

## 2021-11-26 MED ORDER — ORAL CARE MOUTH RINSE
15.0000 mL | OROMUCOSAL | Status: DC
  Administered 2021-11-26 – 2021-12-01 (×21): 15 mL via OROMUCOSAL

## 2021-11-26 MED ORDER — SILDENAFIL CITRATE 20 MG PO TABS
20.0000 mg | ORAL_TABLET | Freq: Three times a day (TID) | ORAL | Status: DC
Start: 2021-11-26 — End: 2021-11-30
  Administered 2021-11-26 – 2021-11-29 (×12): 20 mg via ORAL
  Filled 2021-11-26 (×13): qty 1

## 2021-11-26 MED ORDER — FAMOTIDINE 20 MG PO TABS
20.0000 mg | ORAL_TABLET | Freq: Every day | ORAL | Status: DC
  Administered 2021-11-26 – 2021-11-30 (×5): 20 mg via ORAL
  Filled 2021-11-26 (×5): qty 1

## 2021-11-26 MED ORDER — MIDODRINE HCL 5 MG PO TABS
15.0000 mg | ORAL_TABLET | Freq: Three times a day (TID) | ORAL | Status: DC
Start: 2021-11-26 — End: 2021-12-01
  Administered 2021-11-26 – 2021-12-01 (×15): 15 mg via ORAL
  Filled 2021-11-26 (×15): qty 3

## 2021-11-26 MED ORDER — SENNOSIDES-DOCUSATE SODIUM 8.6-50 MG PO TABS
1.0000 | ORAL_TABLET | Freq: Two times a day (BID) | ORAL | Status: DC
  Administered 2021-11-26 – 2021-11-30 (×9): 1 via ORAL
  Filled 2021-11-26 (×9): qty 1

## 2021-11-26 MED ORDER — BISACODYL 10 MG RE SUPP
10.0000 mg | Freq: Every day | RECTAL | Status: DC | PRN
Start: 2021-11-26 — End: 2021-12-02
  Administered 2021-11-26: 10 mg via RECTAL
  Filled 2021-11-26: qty 1

## 2021-11-26 NOTE — Evaluation (Signed)
Occupational Therapy Evaluation Patient Details Name: Shannon [name] MRN: [id number] DOB: 11/26/1965 Today's Date: 11/26/2021   History of Present Illness Pt is a 56 y.o. female admitted 11/18/2021 with SOB, progressive weakness, AMS. CXR with LLL atelectasis and consolidation. Workup for acute on chronic hypoxemic/hypercarbic respiratory failure due to decompensated R HF and possible LLL CAP. Pt with AKI; CRRT initiated 7/31. PMH includes CHF, DM, HTN, pulmonary HTN.   Clinical Impression   Patient admitted for the diagnosis above.  PTA she lives at home alone, and continues to work full time.  She needed no assist for mobility, ADL, or iADL.  Deficits impacting independence are listed below.  Currently she is needing up to [place] for basic transfers, and Mod A for lower body ADL.  Patient's goals are to return home with assist as needed from family.  OT will follow in the acute setting.        Recommendations for follow up therapy are one component of a multi-disciplinary discharge planning process, led by the attending physician.  Recommendations may be updated based on patient status, additional functional criteria and insurance authorization.   Follow Up Recommendations  Home health OT    Assistance Recommended at Discharge Intermittent Supervision/Assistance  Patient can return home with the following A little help with walking and/or transfers;A little help with bathing/dressing/bathroom;Assist for transportation;Assistance with cooking/housework    Functional Status Assessment  Patient has had a recent decline in their functional status and demonstrates the ability to make significant improvements in function in a reasonable and predictable amount of time.  Equipment Recommendations  BSC/3in1    Recommendations for Other Services       Precautions / Restrictions Precautions Precautions: Fall;Other (comment) Precaution Comments: CRRT, HHFNC (20L O2 at 60%  FiO2) Restrictions Weight Bearing Restrictions: No      Mobility Bed Mobility Overal bed mobility: Needs Assistance Bed Mobility: Supine to Sit     Supine to sit: Mod assist, HOB elevated, +2 for safety/equipment       Patient Response: Cooperative  Transfers Overall transfer level: Needs assistance Equipment used: 1 person hand held assist Transfers: Sit to/from Stand, Bed to chair/wheelchair/BSC Sit to Stand: Min assist     Step pivot transfers: Min assist            Balance Overall balance assessment: Needs assistance   Sitting balance-Leahy Scale: Fair     Standing balance support: Bilateral upper extremity supported Standing balance-Leahy Scale: Fair                             ADL either performed or assessed with clinical judgement   ADL       Grooming: Wash/dry hands;Wash/dry face;Supervision/safety;Sitting           Upper Body Dressing : Moderate assistance;Sitting   Lower Body Dressing: Moderate assistance;Sitting/lateral leans   Toilet Transfer: Minimal assistance;Stand-pivot;BSC/3in1                   Vision Patient Visual Report: No change from baseline       Perception Perception Perception: Within Functional Limits   Praxis Praxis Praxis: Intact    Pertinent Vitals/Pain Pain Assessment Pain Assessment: No/denies pain     Hand Dominance Right   Extremity/Trunk Assessment Upper Extremity Assessment Upper Extremity Assessment: Generalized weakness   Lower Extremity Assessment Lower Extremity Assessment: Defer to PT evaluation   Cervical / Trunk Assessment Cervical / Trunk Assessment: Normal  Communication Communication Communication: No difficulties   Cognition Arousal/Alertness: Awake/alert Behavior During Therapy: WFL for tasks assessed/performed, Flat affect                                         General Comments  SpO2 92% on 20L O2 HHFNC at 60% FiO2, HR 80s     Exercises     Shoulder Instructions      Home Living Family/patient expects to be discharged to:: Private residence Living Arrangements: Alone Available Help at Discharge: Family;Available 24 hours/day Type of Home: Apartment Home Access: Stairs to enter Entrance Stairs-Number of Steps: 3 Entrance Stairs-Rails: Right;Left Home Layout: One level     Bathroom Shower/Tub: Tub/shower unit;Walk-in shower   Bathroom Toilet: Standard     Home Equipment: BSC/3in1   Additional Comments: pt's mom present and reports multiple family members nearby who can stay with pt and provide assist at d/c      Prior Functioning/Environment Prior Level of Function : Independent/Modified Independent;Working/employed;Driving             Mobility Comments: Independent without DME; 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 Enjoys spending time with 3 grandkids who live nearby ADLs Comments: No assist PTA        OT Problem List: Decreased strength;Decreased activity tolerance;Impaired balance (sitting and/or standing);Increased edema      OT Treatment/Interventions: Self-care/ADL training;Therapeutic activities;Therapeutic exercise;DME and/or AE instruction;Patient/family education;Balance training    OT Goals(Current goals can be found in the care plan section) Acute Rehab OT Goals Patient Stated Goal: Hoping to return home OT Goal Formulation: With patient Time For Goal Achievement: 12/10/21 Potential to Achieve Goals: Good ADL Goals Pt Will Perform Grooming: with supervision;standing Pt Will Perform Upper Body Dressing: sitting;with set-up Pt Will Perform Lower Body Dressing: with supervision;sit to/from stand Pt Will Transfer to Toilet: with supervision;ambulating;regular height toilet Pt/caregiver will Perform Home Exercise Program: Increased strength;Both right and left upper extremity;With Supervision  OT Frequency: Min 2X/week     Co-evaluation              AM-PAC OT "6 Clicks" Daily Activity     Outcome Measure Help from another person eating meals?: A Little Help from another person taking care of personal grooming?: A Little Help from another person toileting, which includes using toliet, bedpan, or urinal?: A Little Help from another person bathing (including washing, rinsing, drying)?: A Lot Help from another person to put on and taking off regular upper body clothing?: A Little Help from another person to put on and taking off regular lower body clothing?: A Lot 6 Click Score: 16   End of Session Equipment Utilized During Treatment: Oxygen Nurse Communication: Mobility status  Activity Tolerance: Patient tolerated treatment well Patient left: in chair;with call bell/phone within reach;with family/visitor present  OT Visit Diagnosis: Unsteadiness on feet (R26.81);Muscle weakness (generalized) (M62.81)                Time: 2505-3976 OT Time Calculation (min): 19 min Charges:  OT General Charges $OT Visit: 1 Visit OT Evaluation $OT Eval Moderate Complexity: 1 Mod  11/26/2021  RP, OTR/L  Acute Rehabilitation Services  Office:  [phone]   Metta Clines 11/26/2021, 1:07 PM

## 2021-11-26 NOTE — Progress Notes (Signed)
Orthopedic Tech Progress Note Patient Details:  Shannon [name] June 29, 1965 [id number]  Ortho Devices Type of Ortho Device: Haematologist Ortho Device/Splint Location: BLE Ortho Device/Splint Interventions: Ordered, Application   Post Interventions Patient Tolerated: Well  [name] [name] 11/26/2021, 4:52 PM

## 2021-11-26 NOTE — Progress Notes (Signed)
NAME:  Shannon [name], MRN:  [id number], DOB:  [birth year], LOS: 6 ADMISSION DATE:  11/15/2021, CONSULTATION DATE:  7/28 REFERRING MD:  Dr. Regenia Skeeter, CHIEF COMPLAINT:  chf exacerbation   History of Present Illness:  Patient is a 56 year old female with pertinent PMH HTN, DMT2, severe pulmonary HTN followed by [name] presents to [facility] ED on 7/28 with SOB.  Patient has been experiencing weakness for the past several days and having trouble walking without getting SOB.  Today on 7/28 patient was more confused per family member.  Also was having trouble breathing.  Patient wears 2L [place] at home.  EMS called and transported patient to [facility] ED.  Patient sats low in the 80s.  Upon arrival to [facility] ED on 7/28, patient sats improved on 6L [place].  Hemodynamically stable.  Patient Aox4. CXR showing LLL atelectasis. CT chest showing LLL consolidation. Azithromycin/rocephin given. Troponin wnl. BNP 1,014. ABG initially 7.34, 45, 63, 25. Covid/flu negative. Na 132, K 7.5, creat 3.3, bun 51, ammonia 43. Patient began to have worsening resp distress and was placed on bipap. Given calcium, insulin/dextrose, albuterol, lasix. Patient mental status worsening on bipap. Repeat abg 7.25, 58, 71, 25. Patient arousable but remains lethargic. PCCM consulted.  Pertinent  Medical History   Past Medical History:  Diagnosis Date   CHF (congestive heart failure) ([place])    Diabetes ([place])    Hypertension    Inflammatory myopathy    treated 2012   Pulmonary hypertension ([place])    Wears glasses    reading    Significant Hospital Events: Including procedures, antibiotic start and stop dates in addition to other pertinent events   7/28 admitted [facility] resp failure on bipap  Interim History / Subjective:  Patient remains constipated, with nausea and vomiting NG tube was placed yesterday with 750 cc output Stated feeling little bit better than yesterday, remains on high flow nasal cannula oxygen  Objective   Blood pressure 105/76,  pulse 85, temperature (!) 95.7 F (35.4 C), temperature source Axillary, resp. rate 16, height 5' (1.524 m), weight 88.4 kg, SpO2 91 %. CVP:  [22 mmHg-41 mmHg] 25 mmHg  FiO2 (%):  [50 %-70 %] 60 %   Intake/Output Summary (Last 24 hours) at 11/26/2021 1036 Last data filed at 11/26/2021 1000 Gross per 24 hour  Intake 1184.59 ml  Output 3610 ml  Net -2425.41 ml   Filed Weights   11/24/21 0600 11/25/21 0600 11/26/21 0630  Weight: 95 kg 87.3 kg 88.4 kg    Examination: Physical exam: General: Acute on chronically ill-appearing female, lying on the bed HEENT: [place]/AT, eyes anicteric.  moist mucus membranes Neuro: Alert, awake, following commands  Chest: Reduced air entry at the bases, no wheezes Heart: Regular rate and rhythm, no murmurs or gallops.  2+ pitting edema in bilateral lower extremities Abdomen: Soft, nontender, nondistended, bowel sounds present Skin: No rash  Coox improved to 70% on dobutamine 5 and Levophed at [facility] Problem list     Assessment & Plan:  Acute on chronic hypoxemic and hypercarbic respiratory failure due to decompensated right heart failure and possible LLL CAP Still remain very high risk for progression to intubation Used BiPAP yesterday due to hypercapnia, now back on high flow nasal cannula oxygen at 20 L and 60% Encourage incentive spirometry Continue ceftriaxone and azithromycin to complete 5 days therapy  Acute metabolic encephalopathy due to hypercapnia and hypoxia, improved Patient mental status much improved today, she is alert and awake Avoid sedation  Nausea and vomiting likely due to severe constipation Patient vomited yesterday, continue to complain of nausea NG tube was placed, with 750 cc output H&H remained stable She received Zofran Continue Reglan Keep n.p.o. 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 with acute on chronic right ventricular failure, with restrictive lung disease, OSA follow by heart failure clinic Diuretic/inotrope titration per advanced heart failure team Target pulmonary vasodilators as ordered  Hypervolemic hyponatremia due to right-sided heart failure Hypophosphatemia Closely monitor electrolytes   Best Practice (right click and "Reselect all SmartList Selections" daily)   Diet/type: NPO DVT prophylaxis: prophylactic heparin  GI prophylaxis: N/A Lines: N/A Foley:  N/A Code Status:  full code Last date of multidisciplinary goals of care discussion [7/31 spoke with sister and updated at bedside; would like [place] to remain full code.]  Total critical care time: 32 minutes  Performed by: Jacky Kindle   Critical care time was exclusive of separately billable procedures and treating other patients.   Critical care was necessary to treat or prevent imminent or life-threatening deterioration.   Critical care was time spent personally by me on the following activities: development of treatment plan with patient and/or surrogate as well as nursing, discussions with consultants, evaluation of patient's response to treatment, examination of patient, obtaining history from patient or surrogate, ordering and performing treatments and interventions, ordering and review of laboratory studies, ordering and review of radiographic studies, pulse oximetry and re-evaluation of patient's condition.   Jacky Kindle, MD [facility] See Amion for pager If no response to pager, please call [phone] until 7pm After 7pm, Please call E-link [phone] bradycardia, tingling

## 2021-11-26 NOTE — Progress Notes (Addendum)
Night Shift Summary Note:  On BiPAP 50% overnight, slept well, breathing feels better this AM. Coox up to 51.3>70.6%. CXR appears improved. Placed on [place] 20L/70% this AM, tolerating well.  No gtt titrations overnight. Dobut_0 , Levo_1 .  UOP: 12cc (bloody this AM) NG output: 100cc  Net negative: 225cc  Phos 2, Na 132 >> getting sodium phosphate now   Henreitta Leber, RN 7:19 AM

## 2021-11-26 NOTE — Evaluation (Addendum)
Physical Therapy Evaluation Patient Details Name: Shannon [name] MRN: [id number] DOB: 1966-02-14 Today's Date: 11/26/2021  History of Present Illness  Pt is a 56 y.o. female admitted 11/05/2021 with SOB, progressive weakness, AMS. CXR with LLL atelectasis and consolidation. Workup for acute on chronic hypoxemic/hypercarbic respiratory failure due to decompensated R HF and possible LLL CAP. Pt with AKI; CRRT initiated 7/31. PMH includes CHF, DM, HTN, pulmonary HTN.   Clinical Impression  Pt presents with an overall decrease in functional mobility secondary to above. PTA, pt independent, lives alone, works, enjoys time with grandkids. Today, pt moving fairly well with min-modA, quick to fatigue with standing activity. Hopeful pt will progress well with activity, especially once CRRT/HHFNC lines removed allowing ambulation progression. Pt would benefit from continued acute PT services to maximize functional mobility and independence prior to d/c home.     SpO2 92% on 20L O2 HHFNC at 60% FiO2    Recommendations for follow up therapy are one component of a multi-disciplinary discharge planning process, led by the attending physician.  Recommendations may be updated based on patient status, additional functional criteria and insurance authorization.  Follow Up Recommendations Home health PT (TBD - may progress and not need)      Assistance Recommended at Discharge Frequent or constant Supervision/Assistance  Patient can return home with the following  A little help with walking and/or transfers;A little help with bathing/dressing/bathroom;Assistance with cooking/housework;Assist for transportation;Help with stairs or ramp for entrance    Equipment Recommendations  (TBD)  Recommendations for Other Services       Functional Status Assessment Patient has had a recent decline in their functional status and demonstrates the ability to make significant improvements in function in a reasonable and  predictable amount of time.     Precautions / Restrictions Precautions Precautions: Fall;Other (comment) Precaution Comments: CRRT, HHFNC (20L O2 at 60% FiO2) Restrictions Weight Bearing Restrictions: No      Mobility  Bed Mobility Overal bed mobility: Needs Assistance Bed Mobility: Supine to Sit     Supine to sit: Mod assist, HOB elevated, +2 for safety/equipment     General bed mobility comments: ModA for HHA to scoot hips/BLEs to EOB, increased time and effort    Transfers Overall transfer level: Needs assistance Equipment used: 1 person hand held assist Transfers: Sit to/from Stand, Bed to chair/wheelchair/BSC Sit to Stand: Min assist, +2 safety/equipment, Min guard   Step pivot transfers: Min assist       General transfer comment: minA for HHA to stabilize, pivotal steps from bed to recliner; distance limited by [facility] and CRRT. pt able to perform 3x repeated sit<>stands from recliner with min guard, significant with this requiring seated rest    Ambulation/Gait                  Stairs            Wheelchair Mobility    Modified Rankin (Stroke Patients Only)       Balance Overall balance assessment: Needs assistance   Sitting balance-Leahy Scale: Fair     Standing balance support: No upper extremity supported Standing balance-Leahy Scale: Fair                               Pertinent Vitals/Pain Pain Assessment Pain Assessment: No/denies pain    Home Living Family/patient expects to be discharged to:: Private residence Living Arrangements: Alone Available Help at Discharge: Family;Available 24 hours/day Type  of Home: Apartment Home Access: Stairs to enter Entrance Stairs-Rails: Psychiatric nurse of Steps: 3   Home Layout: One level Home Equipment: BSC/3in1 Additional Comments: pt's mom present and reports multiple family members nearby who can stay with pt and provide assist at d/c    Prior Function  Prior Level of Function : Independent/Modified Independent;Working/employed;Driving             Mobility Comments: Independent without DME; 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 Enjoys spending time with 3 grandkids who live nearby       Hand Dominance        Extremity/Trunk Assessment   Upper Extremity Assessment Upper Extremity Assessment: Generalized weakness    Lower Extremity Assessment Lower Extremity Assessment: Generalized weakness       Communication   Communication: No difficulties  Cognition Arousal/Alertness: Awake/alert Behavior During Therapy: WFL for tasks assessed/performed, Flat affect Overall Cognitive Status: Within Functional Limits for tasks assessed                                 General Comments: WFL for simple tasks, not formally assessed. good awareness of lines with mobility        General Comments General comments (skin integrity, edema, etc.): pt's mom present and supportive. SpO2 92% on 20L O2 HHFNC at 60% FiO2, HR 80s    Exercises  Pt able to perform 2 reps in 30 sec sit-to-stands, reliant on BUE support   Assessment/Plan    PT Assessment Patient needs continued PT services  PT Problem List Decreased strength;Decreased activity tolerance;Decreased balance;Decreased mobility;Cardiopulmonary status limiting activity;Decreased knowledge of use of DME       PT Treatment Interventions DME instruction;Gait training;Stair training;Functional mobility training;Therapeutic activities;Therapeutic exercise;Balance training;Patient/family education    PT Goals (Current goals can be found in the Care Plan section)  Acute Rehab PT Goals Patient Stated Goal: return home, spend time with grandkids PT Goal Formulation: With patient Time For Goal Achievement: 12/10/21 Potential to Achieve Goals: Good    Frequency Min 3X/week     Co-evaluation               AM-PAC PT "6  Clicks" Mobility  Outcome Measure Help needed turning from your back to your side while in a flat bed without using bedrails?: A Lot Help needed moving from lying on your back to sitting on the side of a flat bed without using bedrails?: A Lot Help needed moving to and from a bed to a chair (including a wheelchair)?: A Lot Help needed standing up from a chair using your arms (e.g., wheelchair or bedside chair)?: A Lot Help needed to walk in hospital room?: A Lot Help needed climbing 3-5 steps with a railing? : A Lot 6 Click Score: 12    End of Session   Activity Tolerance: Patient tolerated treatment well;Patient limited by fatigue Patient left: in chair;with call bell/phone within reach;with nursing/sitter in room;with family/visitor present Nurse Communication: Mobility status PT Visit Diagnosis: Other abnormalities of gait and mobility (R26.89);Muscle weakness (generalized) (M62.81)    Time: 2409-7353 PT Time Calculation (min) (ACUTE ONLY): 22 min   Charges:   PT Evaluation $PT Eval Moderate Complexity: [place], PT, DPT Acute Rehabilitation Services  Personal: [facility] Office: [place] 11/26/2021, 12:27 PM

## 2021-11-26 NOTE — Progress Notes (Signed)
Assessment/Plan:56 year old BF with severe pulmonary HTN and chronic volume overload-  presents with A on CRF and hyperkalemia in the setting of what appears to be decompensated right heart failure  1.Renal- CKD as an OP which has been worsening over the last few months in the setting of escalating diuresis.  Renal ultrasound shows a  ? small left kidney but no other issues, urine is bland.  This all appears to be a cardiorenal phenomenon.   Her potassium has come down with just holding her repletion but the main issue is volume overload.  I have discussed with Dr. [name] and aggressive attempt at diuresis with suboptimal response. Will initiate CRRT but not a good long term iHD candidate.  She is so young so reasonable to give a trial of CRRT to normalize volume and see if her kidneys can recover.  If they do not this is a difficult situation as intermittent dialysis in the setting of severe pulmonary HTN and right heart failure; these patients do not tolerate iHD.  Seen on CRRT with UF 50-100 (was on 200 for a few days) but then drop in Coox 8/2 -> net even and now @ 50. Currently on Dobutamine 5 + Levophed 7 + midodrine 15 TID. CVP 25. Will try UNNA boots and UF to 50-100 as tolerated.  On 4K baths and electrolytes look fine; no changes to replacement or dialysate. Monitoring closely as she will dependent on preload as well but on PE e/o still marked fluid overload (but there is improvement on PE with decr edema c/w a few days ago)  2. Hyperkalemia-  due to AKI and high dose potassium repletion.  She has had hyperkalemia for several hours while here in the ER and has not had any arythmia or hemodynamic instability-  because dialysis is not tolerated in these types of patients and she was on 100 meq of potassium daily -  holding supps and treat medically -   is now WNL 3. Hypertension/volume (on low side now with aggressive UF) - clearly volume overloaded but very difficult to diurese also in patients  with right sided heart failure-  per CCM and cards- plan was for lasix drip/metolazone but suboptimal response and now  giving trial of CRRT  4. Pulmonary-  possible PNA/atelectasis-  ABG showing resp acidosis-  now on bipap with improvement-  per CCM-  on recephin and azithro  5. Anemia  - not a major issue at this time   Andrea Ferrer W   Subjective:  fairly stable overnight-  tolerating UF 50  now on Dobutamine and Levophed + Midodrine.  CVP still reading high (25). She feels the breathing is improving.  Objective Vital signs in last 24 hours: Vitals:   11/26/21 0800 11/26/21 0858 11/26/21 0900 11/26/21 1000  BP: 121/87  (!) 134/99 105/76  Pulse: 91   85  Resp: _0 Temp:  (!) 95.7 F (35.4 C)    TempSrc:  Axillary    SpO2: (!) 88%  90% 91%  Weight:      Height:       Weight change: 1.1 kg  Intake/Output Summary (Last 24 hours) at 11/26/2021 1021 Last data filed at 11/26/2021 1000 Gross per 24 hour  Intake 1184.59 ml  Output 3610 ml  Net -2425.41 ml       Labs: Basic Metabolic Panel: Recent Labs  Lab 11/25/21 0434 11/25/21 1434 11/25/21 1639 11/26/21 0400  NA 133* 133* 133* 132*  K  4.6 4.5 4.7 4.2  CL 100 98  --  99  CO2 26 27  --  25  GLUCOSE 104* 110*  --  102*  BUN 17 12  --  8  CREATININE 1.28* 1.21*  --  1.00  CALCIUM 8.3* 8.4*  --  8.3*  PHOS 2.3* 2.5  --  2.0*   Liver Function Tests: Recent Labs  Lab 10/27/2021 1733 11/22/2021 2047 11/21/21 0307 11/23/21 1708 11/24/21 1600 11/25/21 0434 11/26/21 0400  AST _0 --   --   --   --   ALT _1 --   --   --   --   ALKPHOS 128* 135* 111  --   --   --   --   BILITOT 3.9* 3.7* 3.0*  --   --   --   --   PROT 8.2* 8.5* 6.9  --   --   --   --   ALBUMIN 3.6 3.7 3.0*   < > 3.2* 3.1* 3.0*   < > = values in this interval not displayed.   No results for input(s): "LIPASE", "AMYLASE" in the last 168 hours. Recent Labs  Lab 11/11/2021 2047 11/21/21 0346  AMMONIA 43* 41*   CBC: Recent Labs   Lab 11/05/2021 1450 11/12/2021 1530 11/23/21 0354 11/24/21 0405 11/25/21 0434 11/25/21 1434 11/25/21 1639 11/26/21 0400  WBC 7.2   < > 6.9 7.3 7.8 8.0  --  7.0  NEUTROABS 5.0  --   --   --   --   --   --   --   HGB 11.9*   < > 10.1* 9.8* 10.0* 10.6* 12.9 9.8*  HCT 35.1*   < > 29.4* 28.9* 29.1* 30.7* 38.0 28.6*  MCV 82.0   < > 82.4 82.8 82.7 82.3  --  83.4  PLT 317   < > 205 152 150 143*  --  123*   < > = values in this interval not displayed.   Cardiac Enzymes: Recent Labs  Lab 11/21/21 1129  CKTOTAL 62   CBG: Recent Labs  Lab 11/25/21 1533 11/25/21 2030 11/25/21 2352 11/26/21 0402 11/26/21 0854  GLUCAP 95 110* 100* 99 114*    Iron Studies: No results for input(s): "IRON", "TIBC", "TRANSFERRIN", "FERRITIN" in the last 72 hours. Studies/Results: DG CHEST PORT 1 VIEW  Result Date: 11/26/2021 CLINICAL DATA:  Respiratory distress EXAM: PORTABLE CHEST 1 VIEW COMPARISON:  Chest radiograph 11/23/2021 FINDINGS: The right IJ vascular catheter is stable terminating in the region of the cavoatrial junction. A left-sided vascular catheter terminates in the right atrium. The enteric catheter tip is in the expected location of the third portion of the duodenum. The cardiomediastinal silhouette is stable. Lung volumes are low. Streaky opacities are noted in the left base, overall improved since the prior study. There is no new or worsening focal airspace disease. There is no pleural effusion or pneumothorax The bones are stable. IMPRESSION: Low lung volumes with streaky opacities in the left base, overall slightly improved in the interim. No new or worsening focal airspace disease. Electronically Signed   By: Valetta Mole M.D.   On: 11/26/2021 08:19   DG Abd Portable 1V  Result Date: 11/25/2021 CLINICAL DATA:  Nasogastric tube placement. EXAM: PORTABLE ABDOMEN - 1 VIEW COMPARISON:  Abdominal x-ray 11/25/2021 FINDINGS: Nasogastric tube tip is at the level of the gastric antrum. No evidence for  bowel obstruction. Catheter is partially visualized in the distal  SVC. IMPRESSION: Nasogastric tube tip is at the level of the gastric antrum. Electronically Signed   By: Ronney Asters M.D.   On: 11/25/2021 17:45   DG Abd Portable 1V  Result Date: 11/25/2021 CLINICAL DATA:  Constipation with nausea and vomiting. EXAM: PORTABLE ABDOMEN - 1 VIEW COMPARISON:  None Available. FINDINGS: The stomach is mildly distended, with air-filled bowel loops seen overlying the mid abdomen. A paucity of bowel gas is noted throughout the remainder of the abdomen and pelvis. There is no evidence of free air. This is limited in evaluation secondary to the patient's large body habitus. No dilated bowel loops are identified. A mild to moderate amount of stool is seen within the transverse colon. No radio-opaque calculi or other significant radiographic abnormality are seen. IMPRESSION: 1. Mild gastric distention without evidence of bowel obstruction. Further evaluation with abdomen and pelvis CT is recommended if clinical symptoms persist. Electronically Signed   By: Virgina Norfolk M.D.   On: 11/25/2021 15:31   Medications: Infusions:   prismasol BGK 4/2.5 500 mL/hr at 11/26/21 0356    prismasol BGK 4/2.5 300 mL/hr at 11/25/21 1745   sodium chloride     cefTRIAXone (ROCEPHIN)  IV Stopped (11/25/21 2207)   DOBUTamine 5 mcg/kg/min (11/26/21 1000)   norepinephrine (LEVOPHED) Adult infusion 7 mcg/min (11/26/21 1000)   prismasol BGK 4/2.5 1,500 mL/hr at 11/26/21 0506   sodium phosphate 45 mmol in dextrose 5 % 250 mL infusion 44 mL/hr at 11/26/21 1000    Scheduled Medications:  Chlorhexidine Gluconate Cloth  6 each Topical Daily   famotidine  20 mg Oral Daily   heparin  5,000 Units Subcutaneous Q8H   insulin aspart  0-6 Units Subcutaneous Q4H   macitentan  10 mg Oral Daily   metoCLOPramide (REGLAN) injection  10 mg Intravenous Q6H   midodrine  15 mg Oral TID WC   mouth rinse  15 mL Mouth Rinse 4 times per day    polyethylene glycol  17 g Oral Daily   senna-docusate  1 tablet Oral BID   sildenafil  20 mg Oral TID    have reviewed scheduled and prn medications.  Physical Exam: General:  alert-  on HFNC Heart:tachy Lungs: decr movement, coarse rales present Abdomen: non tender Extremities: pitting edema all the way to the thighs Dialysis Access: temp cath working well, no oozing     11/26/2021,10:21 AM  LOS: 6 days

## 2021-11-26 NOTE — Progress Notes (Addendum)
Patient ID: CALLAN YONTZ, female   DOB: Apr 29, 1965, 56 y.o.   MRN: [id number]     Advanced Heart Failure Rounding Note  PCP-Cardiologist: Fransico Him, MD   Subjective:    7/31: started on CRRT 8/1: NE added for BP support to facilitate CRRT volume removal  8/22: Co-ox 41% (confirmed x 2), DBA increased to 5.  constipation, N/V + abd distention. KUB unremarkable. NGT placed. Reglan started   Remains on DBA 5 + NE 7 + midodrine 15 mg tid. Co-ox 70%.   Anuric. Only 3.6L volume removal off CRRT yesterday. Volume removal paused yesterday afternoon w/ drop in co-ox (pre-load dependent). CRRT running even. CVP 25    Wt up 2 lb.   NGT output 800 cc. No further n/v. Not BM yet. Getting suppository this morning.   Feeling slightly better. Remains on HL [place]. No dyspnea w/ current support.    Echo: EF 60-65%, D-shaped septum, moderate RV enlargement/moderate RV dysfunction, PASP 59, mod-severe TR, IVC dilated.    Objective:   Weight Range: 88.4 kg Body mass index is 38.06 kg/m.   Vital Signs:   Temp:  [96.7 F (35.9 C)-97.4 F (36.3 C)] 97.4 F (36.3 C) (08/02 2312) Pulse Rate:  [41-95] 88 (08/03 0713) Resp:  [11-27] 15 (08/03 0713) BP: (71-116)/(50-99) 105/77 (08/03 0700) SpO2:  [79 %-98 %] 98 % (08/03 0713) FiO2 (%):  [50 %-70 %] 60 % (08/03 0713) Weight:  [88.4 kg] 88.4 kg (08/03 0630) Last BM Date : 11/22/21  Weight change: Filed Weights   11/24/21 0600 11/25/21 0600 11/26/21 0630  Weight: 95 kg 87.3 kg 88.4 kg    Intake/Output:   Intake/Output Summary (Last 24 hours) at 11/26/2021 0750 Last data filed at 11/26/2021 0700 Gross per 24 hour  Intake 1901.66 ml  Output 4410.7 ml  Net -2509.04 ml      Physical Exam    General:  chronically ill and fatigued appearing No respiratory difficulty HEENT: normal Neck: supple. JVD elevated to jaw. + HD cath rt IJ, Carotids 2+ bilat; no bruits. No lymphadenopathy or thyromegaly appreciated. Cor: PMI nondisplaced. Regular  rate & rhythm. No rubs, gallops or murmurs. Lungs: decreased BS at the bases  Abdomen: obese and distended. Hypoactive BS. No hepatosplenomegaly. No bruits or masses. Good bowel sounds. Extremities: no cyanosis, clubbing, rash, 1+ b/l LE edema + TED hoses  Neuro: alert & oriented x 3, cranial nerves grossly intact. moves all 4 extremities w/o difficulty. Affect pleasant.   Telemetry   Sinus tachy 100s (personally reviewed)  Labs    CBC Recent Labs    11/25/21 1434 11/25/21 1639 11/26/21 0400  WBC 8.0  --  7.0  HGB 10.6* 12.9 9.8*  HCT 30.7* 38.0 28.6*  MCV 82.3  --  83.4  PLT 143*  --  458*   Basic Metabolic Panel Recent Labs    11/25/21 1434 11/25/21 1639 11/26/21 0400  NA 133* 133* 132*  K 4.5 4.7 4.2  CL 98  --  99  CO2 27  --  25  GLUCOSE 110*  --  102*  BUN 12  --  8  CREATININE 1.21*  --  1.00  CALCIUM 8.4*  --  8.3*  MG 2.4  --  2.5*  PHOS 2.5  --  2.0*   Liver Function Tests Recent Labs    11/25/21 0434 11/26/21 0400  ALBUMIN 3.1* 3.0*   No results for input(s): "LIPASE", "AMYLASE" in the last 72 hours. Cardiac Enzymes No results  for input(s): "CKTOTAL", "CKMB", "CKMBINDEX", "TROPONINI" in the last 72 hours.   BNP: BNP (last 3 results) Recent Labs    09/10/21 1614 11/11/21 1620 11/19/2021 1510  BNP 916.7* 1,063.1* 1,014.0*    ProBNP (last 3 results) No results for input(s): "PROBNP" in the last 8760 hours.   D-Dimer No results for input(s): "DDIMER" in the last 72 hours. Hemoglobin A1C No results for input(s): "HGBA1C" in the last 72 hours.  Fasting Lipid Panel No results for input(s): "CHOL", "HDL", "LDLCALC", "TRIG", "CHOLHDL", "LDLDIRECT" in the last 72 hours. Thyroid Function Tests No results for input(s): "TSH", "T4TOTAL", "T3FREE", "THYROIDAB" in the last 72 hours.  Invalid input(s): "FREET3"  Other results:   Imaging    DG Abd Portable 1V  Result Date: 11/25/2021 CLINICAL DATA:  Nasogastric tube placement. EXAM:  PORTABLE ABDOMEN - 1 VIEW COMPARISON:  Abdominal x-ray 11/25/2021 FINDINGS: Nasogastric tube tip is at the level of the gastric antrum. No evidence for bowel obstruction. Catheter is partially visualized in the distal SVC. IMPRESSION: Nasogastric tube tip is at the level of the gastric antrum. Electronically Signed   By: Ronney Asters M.D.   On: 11/25/2021 17:45   DG Abd Portable 1V  Result Date: 11/25/2021 CLINICAL DATA:  Constipation with nausea and vomiting. EXAM: PORTABLE ABDOMEN - 1 VIEW COMPARISON:  None Available. FINDINGS: The stomach is mildly distended, with air-filled bowel loops seen overlying the mid abdomen. A paucity of bowel gas is noted throughout the remainder of the abdomen and pelvis. There is no evidence of free air. This is limited in evaluation secondary to the patient's large body habitus. No dilated bowel loops are identified. A mild to moderate amount of stool is seen within the transverse colon. No radio-opaque calculi or other significant radiographic abnormality are seen. IMPRESSION: 1. Mild gastric distention without evidence of bowel obstruction. Further evaluation with abdomen and pelvis CT is recommended if clinical symptoms persist. Electronically Signed   By: Virgina Norfolk M.D.   On: 11/25/2021 15:31     Medications:     Scheduled Medications:  Chlorhexidine Gluconate Cloth  6 each Topical Daily   famotidine  20 mg Per Tube Daily   heparin  5,000 Units Subcutaneous Q8H   insulin aspart  0-6 Units Subcutaneous Q4H   macitentan  10 mg Oral Daily   metoCLOPramide (REGLAN) injection  10 mg Intravenous Q6H   midodrine  15 mg Per Tube TID WC   mouth rinse  15 mL Mouth Rinse 4 times per day   polyethylene glycol  17 g Oral Daily   senna-docusate  1 tablet Per Tube BID   sildenafil  20 mg Per Tube TID    Infusions:   prismasol BGK 4/2.5 500 mL/hr at 11/26/21 0356    prismasol BGK 4/2.5 300 mL/hr at 11/25/21 1745   sodium chloride     cefTRIAXone (ROCEPHIN)   IV Stopped (11/25/21 2207)   DOBUTamine 5 mcg/kg/min (11/26/21 0700)   norepinephrine (LEVOPHED) Adult infusion 7 mcg/min (11/26/21 0700)   prismasol BGK 4/2.5 1,500 mL/hr at 11/26/21 0506   sodium phosphate 45 mmol in dextrose 5 % 250 mL infusion 44 mL/hr at 11/26/21 0700    PRN Medications: Place/Maintain arterial line **AND** sodium chloride, bisacodyl, docusate, heparin, ondansetron (ZOFRAN) IV, mouth rinse, polyethylene glycol, simethicone, sodium chloride, white petrolatum    Assessment/Plan   1. Pulmonary hypertension/RV failure: Echo in 11/22 showed EF 60-65%, mild LVH, severe RV enlargement with severely decreased RV systolic function, PASP 66 mmHg,  severe RAE, moderate TR, moderate PR, small pericardial effusion. CTA chest showed no PE, normal lung fields and V/Q scan showed no evidence for chronic PE.  PFTs showed severe restriction and decreased DLCO.  RHC/LHC showed severe pulmonary arterial HTN with RV failure.  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  No history of liver disease.  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  ANA, SCL-70, anti-centromere antibody, and RF negative with elevated CRP. She may have connective tissue disease-related PAH though she saw rheumatology and no definite diagnosis was given.  High resolution CT chest showed no evidence for ILD.  She was admitted this time with worsening RV failure and AKI. Echo this admission with EF 60-65%, D-shaped septum, moderate RV enlargement/moderate RV dysfunction, PASP 59, mod-severe TR, IVC dilated. CVP is still 30 this morning, marked volume overload on exam.  She was started on dobutamine 2.5 for RV support, ultimately further titrated to 5 after Co-ox dropped to 40%. Oliguric despite high dose lasix gtt at 30/hr. Started on CRRT 7/31 for retractable volume overload. This morning on DBA 5 + 7 NE. Co-ox 70%. Remains anuric. CVP  25. CRRT currently running even.  - Continue DBA + NE. Will see if we decongest RV further but will need to lower CRRT pull rate - Continue midodrine 15 tid.  - Continue Opsumit 10 mg daily.  - Transitioned tadalafil to sildenafil with AKI, will use 20 mg tid.  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 She does not report myalgias or muscle pain.  Myositis panel was negative.  She saw rheumatology and no definite diagnosis was given. CK normal at 62 this admission.  3. Acute on chronic hypoxemic respiratory failure: On 2L home oxygen at baseline, currently on HFNC (initially on Bipap).  - c/w abx for CAP per PCCM  4. AKI on CKD stage 3: Creatinine had been slowly rising at home with diuresis, suspect cardiorenal syndrome.  However, up to 3.3 at admission with marked hyperkalemia. No arrhythmias. K now normal. Anuric.  -  Continue trial of CVVH to see if we can decongest her then titrate off the CVVH. Would likely tolerate iHD poorly with severe RV failure. - Nephrology following. Appreciate their assistance  5. ID: CT chest with LLL infiltrate.  - Ceftriaxone/azithromycin.  6. GI: developed constipation, N/V and abdominal distention. KUB 8/2 unremarkable. NGT placed. Given sorbitol. Reglan started - 800 cc NGT output, continue today  - Reglan q6h  Length of Stay: [address] PA-C  11/26/2021, 7:50 AM  Advanced Heart Failure Team Pager [phone] (M-F; 7a - 5p)  Please contact [facility] for night-coverage after hours (5p -7a ) and weekends on [email or web address]   Patient seen with PA, agree with the above note.   I/Os net 2509 with CVVH, she is anuric.  Dropped BP yesterday afternoon with aggressive CVVH and developed nausea/vomiting, co-ox also was low.  Dobutamine increased to 5 and CVVH run even after that. NGT placed, KUB  unremarkable.   Doing better today, on dobutamine 5 + NE 7 with co-ox 71%.  CVP 25.  Have started pulling UF net 50 cc/hr.   General: NAD Neck: JVP 16 cm, no thyromegaly or thyroid nodule.  Lungs: Clear to auscultation bilaterally with normal  respiratory effort. CV: Nondisplaced PMI.  Heart regular S1/S2, no S3/S4, no murmur.  1+ edema to thighs.  Abdomen: Soft, nontender, no hepatosplenomegaly, no distention.  Skin: Intact without lesions or rashes.  Neurologic: Alert and oriented x 3.  Psych: Normal affect. Extremities: No clubbing or cyanosis.  HEENT: Normal.   Continue ceftriaxone for possible PNA.   Continue dobutamine/NE for RV failure.  I think we ran CVVH too fast yesterday and she did not tolerate.  Would aim to run UF net 100 cc/hr negative today.  Needs more fluid off.   Long-term, I am concerned we do not have a lot of good options.  She is anuric, I worry that her renal function will not recover and she will be a poor iHD candidate.   CRITICAL CARE Performed by: Loralie Champagne  Total critical care time: 35 minutes  Critical care time was exclusive of separately billable procedures and treating other patients.  Critical care was necessary to treat or prevent imminent or life-threatening deterioration.  Critical care was time spent personally by me on the following activities: development of treatment plan with patient and/or surrogate as well as nursing, discussions with consultants, evaluation of patient's response to treatment, examination of patient, obtaining history from patient or surrogate, ordering and performing treatments and interventions, ordering and review of laboratory studies, ordering and review of radiographic studies, pulse oximetry and re-evaluation of patient's condition.  Loralie Champagne 11/26/2021 9:14 AM

## 2021-11-27 DIAGNOSIS — R570 Cardiogenic shock: Secondary | ICD-10-CM | POA: Diagnosis not present

## 2021-11-27 DIAGNOSIS — N179 Acute kidney failure, unspecified: Secondary | ICD-10-CM | POA: Diagnosis not present

## 2021-11-27 DIAGNOSIS — I50810 Right heart failure, unspecified: Secondary | ICD-10-CM | POA: Diagnosis not present

## 2021-11-27 DIAGNOSIS — J9601 Acute respiratory failure with hypoxia: Secondary | ICD-10-CM | POA: Diagnosis not present

## 2021-11-27 LAB — RENAL FUNCTION PANEL
Albumin: 3 g/dL — ABNORMAL LOW (ref 3.5–5.0)
Albumin: 3.2 g/dL — ABNORMAL LOW (ref 3.5–5.0)
Anion gap: 10 (ref 5–15)
Anion gap: 7 (ref 5–15)
BUN: 7 mg/dL (ref 6–20)
BUN: 6 mg/dL (ref 6–20)
CO2: 24 mmol/L (ref 22–32)
CO2: 26 mmol/L (ref 22–32)
Calcium: 8.4 mg/dL — ABNORMAL LOW (ref 8.9–10.3)
Calcium: 8.4 mg/dL — ABNORMAL LOW (ref 8.9–10.3)
Chloride: 97 mmol/L — ABNORMAL LOW (ref 98–111)
Chloride: 96 mmol/L — ABNORMAL LOW (ref 98–111)
Creatinine, Ser: 0.82 mg/dL (ref 0.44–1.00)
Creatinine, Ser: 0.86 mg/dL (ref 0.44–1.00)
GFR, Estimated: >60 mL/min (ref >60)
GFR, Estimated: >60 mL/min (ref >60)
Glucose, Bld: 119 mg/dL — ABNORMAL HIGH (ref 70–99)
Glucose, Bld: 146 mg/dL — ABNORMAL HIGH (ref 70–99)
Phosphorus: 2.4 mg/dL — ABNORMAL LOW (ref 2.5–4.6)
Phosphorus: 2.1 mg/dL — ABNORMAL LOW (ref 2.5–4.6)
Potassium: 4 mmol/L (ref 3.5–5.1)
Potassium: 4.3 mmol/L (ref 3.5–5.1)
Sodium: 131 mmol/L — ABNORMAL LOW (ref 135–145)
Sodium: 129 mmol/L — ABNORMAL LOW (ref 135–145)

## 2021-11-27 LAB — CBC
HCT: 28.5 % — ABNORMAL LOW (ref 36.0–46.0)
Hemoglobin: 9.9 g/dL — ABNORMAL LOW (ref 12.0–15.0)
MCH: 28.3 pg (ref 26.0–34.0)
MCHC: 34.7 g/dL (ref 30.0–36.0)
MCV: 81.4 fL (ref 80.0–100.0)
Platelets: 118 K/uL — ABNORMAL LOW (ref 150–400)
RBC: 3.5 MIL/uL — ABNORMAL LOW (ref 3.87–5.11)
RDW: 18.8 % — ABNORMAL HIGH (ref 11.5–15.5)
WBC: 7.7 K/uL (ref 4.0–10.5)
nRBC: 1 % — ABNORMAL HIGH (ref 0.0–0.2)

## 2021-11-27 LAB — GLUCOSE, CAPILLARY
Glucose-Capillary: 118 mg/dL — ABNORMAL HIGH (ref 70–99)
Glucose-Capillary: 168 mg/dL — ABNORMAL HIGH (ref 70–99)
Glucose-Capillary: 152 mg/dL — ABNORMAL HIGH (ref 70–99)
Glucose-Capillary: 142 mg/dL — ABNORMAL HIGH (ref 70–99)
Glucose-Capillary: 145 mg/dL — ABNORMAL HIGH (ref 70–99)

## 2021-11-27 LAB — COOXEMETRY PANEL
Carboxyhemoglobin: 1.6 % — ABNORMAL HIGH (ref 0.5–1.5)
Methemoglobin: <0.7 % (ref 0.0–1.5)
O2 Saturation: 71.1 %
Total hemoglobin: 10.3 g/dL — ABNORMAL LOW (ref 12.0–16.0)

## 2021-11-27 LAB — MAGNESIUM: Magnesium: 2.3 mg/dL (ref 1.7–2.4)

## 2021-11-27 MED ORDER — RENA-VITE PO TABS
1.0000 | ORAL_TABLET | Freq: Every day | ORAL | Status: DC
  Administered 2021-11-27 – 2021-11-30 (×4): 1 via ORAL
  Filled 2021-11-27 (×4): qty 1

## 2021-11-27 MED ORDER — PROSOURCE PLUS PO LIQD
30.0000 mL | Freq: Two times a day (BID) | ORAL | Status: DC
  Administered 2021-11-28: 30 mL via ORAL
  Filled 2021-11-27 (×2): qty 30

## 2021-11-27 MED ORDER — GERHARDT'S BUTT CREAM
TOPICAL_CREAM | Freq: Three times a day (TID) | CUTANEOUS | Status: DC
  Administered 2021-11-27 – 2021-12-01 (×5): 1 via TOPICAL
  Filled 2021-11-27: qty 1

## 2021-11-27 MED ORDER — NEPRO/CARBSTEADY PO LIQD
237.0000 mL | Freq: Every day | ORAL | Status: DC
  Administered 2021-11-27 – 2021-11-29 (×3): 237 mL via ORAL

## 2021-11-27 NOTE — Progress Notes (Signed)
Initial Nutrition Assessment  DOCUMENTATION CODES:   Not applicable  INTERVENTION:   Liberalize diet to REGULAR with 1800 mL fluid restriction  Add Renal MVI daily  Add Nepro Shake po daily as evening snack, each supplement provides 425 kcal and 19 grams protein. Ok to substitute Ensure Enlive/Ensure Plus if pt does not like Nepro  Magic cup BID with meals, each supplement provides 290 kcal and 9 grams of protein  30 ml ProSource Plus BID, each supplement provides 100 kcals and 15 grams protein.    NUTRITION DIAGNOSIS:   Inadequate oral intake related to poor appetite, nausea, vomiting, acute illness as evidenced by per patient/family report.  GOAL:   Patient will meet greater than or equal to 90% of their needs   MONITOR:   PO intake, Supplement acceptance, Labs, Weight trends, Skin  REASON FOR ASSESSMENT:   Rounds (CRRT, poor appetite)    ASSESSMENT:   56 yo female admitted with acute on chronic CKD with hyperkalemia in setting of decompensated right heart failure. PMH includes CHF, DM, HTN, severe pulmonary HTN, chronic volume overload, chronic respiratory failure on home oxygen.  7/31 CRRT initiated 8/02 N/V with abd distention, constipation, KUB unremarkable, NG tube placed, reglan started  Pt remains on HFNC, Bipap overnight  Remains on CRRT, UF increased to 150 ml/hr. Pt on dobutamine and levophed in addition to midodrine for BP support. Currently anuric  Pt reports appetite is fair. Pt reports she believes she was eating well prior to admission but her mother who is at bedside states pt was not eating well, only eating 1 meal per day.  NPO on 8/2 due to N/V and NG placed for decompression. Diet advanced to CL then FL then Heart Healthy with 1800 mL fluid restriction on 8/03  No Nausea/vomiting today, NG removed. . Pt ate 20% at breakfast today, 25% at lunch  Pt denies any problems chewing or swallowing, reports the lump in her throat is gone now that NG  has been removed.   Current wt 89 kg  Labs: phosphorus 2.4 (L), potassium 4.0 (wdl), sodium 131 (L), Creatinine wdl Meds: ss novolog, midodrine, miralax, colace  NUTRITION - FOCUSED PHYSICAL EXAM: Unable to assess  Diet Order:  HEART HEALTHY with 1800 mL FLUID RESTRICTION   EDUCATION NEEDS:   Education needs have been addressed  Skin:  Skin Assessment: Skin Integrity Issues: Skin Integrity Issues:: Stage II Stage II: coccyx  Last BM:  8/03  Height:   Ht Readings from Last 1 Encounters:  11/14/2021 5' (1.524 m)    Weight:   Wt Readings from Last 1 Encounters:  11/27/21 89.1 kg     BMI:  Body mass index is 38.36 kg/m.  Estimated Nutritional Needs:   Kcal:  1550-1750 kcals  Protein:  75-100 g  Fluid:  1.8 L restriction per MD  Kerman Passey MS, RDN, LDN, CNSC Registered Dietitian 3 Clinical Nutrition RD Pager and On-Call Pager Number Located in [place]

## 2021-11-27 NOTE — Progress Notes (Signed)
NAME:  Shannon [name], MRN:  [id number], DOB:  [birth year], LOS: 7 ADMISSION DATE:  10/28/2021, CONSULTATION DATE:  7/28 REFERRING MD:  Dr. Regenia Skeeter, CHIEF COMPLAINT:  chf exacerbation   History of Present Illness:  Patient is a 56 year old female with pertinent PMH HTN, DMT2, severe pulmonary HTN followed by Shannon [name] presents to [facility] ED on 7/28 with SOB.  Patient has been experiencing weakness for the past several days and having trouble walking without getting SOB.  Today on 7/28 patient was more confused per family member.  Also was having trouble breathing.  Patient wears 2L [place] at home.  EMS called and transported patient to [facility] ED.  Patient sats low in the 80s.  Upon arrival to [facility] ED on 7/28, patient sats improved on 6L [place].  Hemodynamically stable.  Patient Aox4. CXR showing LLL atelectasis. CT chest showing LLL consolidation. Azithromycin/rocephin given. Troponin wnl. BNP 1,014. ABG initially 7.34, 45, 63, 25. Covid/flu negative. Na 132, K 7.5, creat 3.3, bun 51, ammonia 43. Patient began to have worsening resp distress and was placed on bipap. Given calcium, insulin/dextrose, albuterol, lasix. Patient mental status worsening on bipap. Repeat abg 7.25, 58, 71, 25. Patient arousable but remains lethargic. PCCM consulted.  Pertinent  Medical History   Past Medical History:  Diagnosis Date   CHF (congestive heart failure) (Shannon [name])    Diabetes ([place])    Hypertension    Inflammatory myopathy    treated 2012   Pulmonary hypertension (Shannon [name])    Wears glasses    reading    Significant Hospital Events: Including procedures, antibiotic start and stop dates in addition to other pertinent events   7/28 admitted [facility] resp failure on bipap  Interim History / Subjective:  Patient had a bowel movement yesterday Now stated no nausea or vomiting NG tube was clamped and then was taken out Remain on high flow nasal cannula oxygen, required BiPAP overnight  Objective   Blood pressure 105/68, pulse 86,  temperature (!) 97.3 F (36.3 C), temperature source Oral, resp. rate 20, height 5' (1.524 m), weight 89.1 kg, SpO2 95 %. CVP:  [25 mmHg-30 mmHg] 26 mmHg  FiO2 (%):  [70 %] 70 %   Intake/Output Summary (Last 24 hours) at 11/27/2021 1331 Last data filed at 11/27/2021 1300 Gross per 24 hour  Intake 2550.83 ml  Output 4579 ml  Net -2028.17 ml   Filed Weights   11/25/21 0600 11/26/21 0630 11/27/21 0500  Weight: 87.3 kg 88.4 kg 89.1 kg    Examination: Physical exam: General: Acute on chronically ill-appearing female, lying on the bed HEENT: [place]/AT, eyes anicteric.  moist mucus membranes Neuro: Alert, awake, following commands  Chest: Reduced air entry at the bases, no wheezes Heart: Regular rate and rhythm, no murmurs or gallops.  2+ pitting edema in bilateral lower extremities Abdomen: Soft, nontender, nondistended, bowel sounds present Skin: No rash  [place] improved to 71% on dobutamine 5 and Levophed at 8 CVP remain elevated to 27   Resolved Hospital Problem list     Assessment & Plan:  Acute on chronic hypoxemic and hypercarbic respiratory failure due to decompensated right heart failure and possible LLL CAP Still remain very high risk for progression to intubation Patient uses BiPAP at night and high flow nasal cannula during daytime Currently on 30 L and 70% FiO2  Encourage incentive spirometry Completed antibiotic therapy  Acute metabolic encephalopathy due to hypercapnia and hypoxia, improved Patient mental status has improved now Continue BiPAP at night to help prevent  hypercapnia  Nausea and vomiting likely due to severe constipation, improved Patient does not feel nauseous anymore Had a bowel movement yesterday NG tube was discontinued Continue as needed Zofran  Acute kidney injury on CKD stage IIIa- most c/w cardiorenal Remains anuric Continue CRRT with net 50 -100 cc/h Monitor intake and output  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 with acute on chronic right  ventricular failure, with restrictive lung disease, OSA follow by heart failure clinic Diuretic/inotrope titration per advanced heart failure team Target pulmonary vasodilators as ordered  Hypervolemic hyponatremia due to right-sided heart failure Hypophosphatemia Serum sodium is at 131 Hold on supplementing phosphate considering patient is on CRRT Closely monitor electrolytes   Best Practice (right click and "Reselect all SmartList Selections" daily)   Diet/type: Regular consistency DVT prophylaxis: prophylactic heparin  GI prophylaxis: N/A Lines: N/A Foley:  N/A Code Status:  full code Last date of multidisciplinary goals of care discussion [7/31 spoke with sister and updated at bedside; would like [place] to remain full code.]  Total critical care time: 32 minutes  Performed by: Jacky Kindle   Critical care time was exclusive of separately billable procedures and treating other patients.   Critical care was necessary to treat or prevent imminent or life-threatening deterioration.   Critical care was time spent personally by me on the following activities: development of treatment plan with patient and/or surrogate as well as nursing, discussions with consultants, evaluation of patient's response to treatment, examination of patient, obtaining history from patient or surrogate, ordering and performing treatments and interventions, ordering and review of laboratory studies, ordering and review of radiographic studies, pulse oximetry and re-evaluation of patient's condition.   Jacky Kindle, MD [facility] See Amion for pager If no response to pager, please call [phone] until 7pm After 7pm, Please call E-link [phone] bradycardia, tingling

## 2021-11-27 NOTE — Progress Notes (Addendum)
Patient ID: Shannon [name], female   DOB: 03-26-1966, 56 y.o.   MRN: [id number]     Advanced Heart Failure Rounding Note  PCP-Cardiologist: Fransico Him, MD   Subjective:    7/31: started on CRRT 8/1: NE added for BP support to facilitate CRRT volume removal  8/22: Co-ox 41% (confirmed x 2), DBA increased to 5.  constipation, N/V + abd distention. KUB unremarkable. NGT placed. Reglan started   Continues on 5 DBA + 8NE. CO-OX 71%.   CVP 27. 3.4L volume removed yesterday with CVVH. Now pulling for negative 50-100/hr. SBP 80s-90s, MAP 70s.  Continues on heated HF  30L FiO2 70%.  NG tube out. Has been eating a little, doesn't have much appetite. NO dyspnea on current O2 requirements.   Echo: EF 60-65%, D-shaped septum, moderate RV enlargement/moderate RV dysfunction, PASP 59, mod-severe TR, IVC dilated.    Objective:   Weight Range: 89.1 kg Body mass index is 38.36 kg/m.   Vital Signs:   Temp:  [95.7 F (35.4 C)-97.3 F (36.3 C)] 97.3 F (36.3 C) (08/04 0830) Pulse Rate:  [39-95] 90 (08/04 0818) Resp:  [13-22] 20 (08/04 0818) BP: (80-134)/(54-99) 93/59 (08/04 0818) SpO2:  [87 %-100 %] 95 % (08/04 0818) FiO2 (%):  [70 %] 70 % (08/04 0818) Weight:  [89.1 kg] 89.1 kg (08/04 0500) Last BM Date : 11/26/21  Weight change: Filed Weights   11/25/21 0600 11/26/21 0630 11/27/21 0500  Weight: 87.3 kg 88.4 kg 89.1 kg    Intake/Output:   Intake/Output Summary (Last 24 hours) at 11/27/2021 0854 Last data filed at 11/27/2021 0820 Gross per 24 hour  Intake 2636.32 ml  Output 3458 ml  Net -821.68 ml      Physical Exam  CVP 27 General:  Chronically ill appearing AAF. HEENT: normal Neck: supple. JVP to ear. Carotids 2+ bilat; no bruits. , R IJ HD cath Cor: PMI nondisplaced. Regular rate & rhythm. No rubs, gallops or murmurs. Lungs: clear Abdomen: obese, soft, nontender, + mildly distended.  Extremities: no cyanosis, clubbing, rash, 1+ edema Neuro: alert & orientedx3,  cranial nerves grossly intact. moves all 4 extremities w/o difficulty. Affect pleasant    Telemetry   SR 80s, intermittently has PVCs in bigeminy pattern  Labs    CBC Recent Labs    11/26/21 0400 11/27/21 0411  WBC 7.0 7.7  HGB 9.8* 9.9*  HCT 28.6* 28.5*  MCV 83.4 81.4  PLT 123* 417*   Basic Metabolic Panel Recent Labs    11/26/21 0400 11/26/21 1600 11/26/21 2057 11/27/21 0411  NA 132* 129*  --  131*  K 4.2 4.2  --  4.0  CL 99 93*  --  97*  CO2 25 24  --  24  GLUCOSE 102* 142*  --  119*  BUN 8 9  --  7  CREATININE 1.00 0.91  --  0.82  CALCIUM 8.3* 8.3*  --  8.4*  MG 2.5*  --   --  2.3  PHOS 2.0* 3.2 2.7 2.4*   Liver Function Tests Recent Labs    11/26/21 1600 11/27/21 0411  ALBUMIN 3.2* 3.0*   No results for input(s): "LIPASE", "AMYLASE" in the last 72 hours. Cardiac Enzymes No results for input(s): "CKTOTAL", "CKMB", "CKMBINDEX", "TROPONINI" in the last 72 hours.   BNP: BNP (last 3 results) Recent Labs    09/10/21 1614 11/11/21 1620 10/25/2021 1510  BNP 916.7* 1,063.1* 1,014.0*    ProBNP (last 3 results) No results for input(s): "PROBNP" in  the last 8760 hours.   D-Dimer No results for input(s): "DDIMER" in the last 72 hours. Hemoglobin A1C No results for input(s): "HGBA1C" in the last 72 hours.  Fasting Lipid Panel No results for input(s): "CHOL", "HDL", "LDLCALC", "TRIG", "CHOLHDL", "LDLDIRECT" in the last 72 hours. Thyroid Function Tests No results for input(s): "TSH", "T4TOTAL", "T3FREE", "THYROIDAB" in the last 72 hours.  Invalid input(s): "FREET3"  Other results:   Imaging    No results found.   Medications:     Scheduled Medications:  Chlorhexidine Gluconate Cloth  6 each Topical Daily   famotidine  20 mg Oral Daily   heparin  5,000 Units Subcutaneous Q8H   insulin aspart  0-6 Units Subcutaneous Q4H   macitentan  10 mg Oral Daily   midodrine  15 mg Oral TID WC   mouth rinse  15 mL Mouth Rinse 4 times per day    polyethylene glycol  17 g Oral Daily   senna-docusate  1 tablet Oral BID   sildenafil  20 mg Oral TID    Infusions:   prismasol BGK 4/2.5 500 mL/hr at 11/27/21 0030    prismasol BGK 4/2.5 300 mL/hr at 11/27/21 0449   sodium chloride     DOBUTamine 5 mcg/kg/min (11/27/21 0800)   norepinephrine (LEVOPHED) Adult infusion 8 mcg/min (11/27/21 0800)   prismasol BGK 4/2.5 1,500 mL/hr at 11/27/21 0536    PRN Medications: Place/Maintain arterial line **AND** sodium chloride, bisacodyl, docusate, heparin, ondansetron (ZOFRAN) IV, mouth rinse, polyethylene glycol, simethicone, sodium chloride, white petrolatum    Assessment/Plan   1. Pulmonary hypertension/RV failure: Echo in 11/22 showed EF 60-65%, mild LVH, severe RV enlargement with severely decreased RV systolic function, PASP 66 mmHg, severe RAE, moderate TR, moderate PR, small pericardial effusion. CTA chest showed no PE, normal lung fields and V/Q scan showed no evidence for chronic PE.  PFTs showed severe restriction and decreased DLCO.  RHC/LHC showed severe pulmonary arterial HTN with RV failure.  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  No history of liver disease.  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  ANA, SCL-70, anti-centromere antibody, and RF negative with elevated CRP. She may have connective tissue disease-related PAH though she saw rheumatology and no definite diagnosis was given.  High resolution CT chest showed no evidence for ILD.  She was admitted this time with worsening RV failure and AKI. Echo this admission with EF 60-65%, D-shaped septum, moderate RV enlargement/moderate RV dysfunction, PASP 59, mod-severe TR, IVC dilated. CVP is still 30 this morning, marked volume overload on exam.  She was started on dobutamine 2.5 for RV support, ultimately further titrated to 5 after Co-ox dropped to 40%. Oliguric despite high dose lasix  gtt at 30/hr. Started on CRRT 7/31 for retractable volume overload. This morning on DBA 5 + 8 NE. Co-ox 71%. Remains anuric. CVP 27. CRRT currently pulling for net - 50-100/hr, need to increase to 150.  - Continue DBA + NE. Will see if we decongest RV further. Need to increase CRRT pull rate as above - Continue midodrine 15 tid.  - Continue Opsumit 10 mg daily.  - Transitioned tadalafil to sildenafil with AKI, will use 20 mg tid.  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 She does not  report myalgias or muscle pain.  Myositis panel was negative.  She saw rheumatology and no definite diagnosis was given. CK normal at 62 this admission.  3. Acute on chronic hypoxemic respiratory failure: On 2L home oxygen at baseline, currently on HFNC (initially on Bipap).  - c/w abx for CAP per PCCM  4. AKI on CKD stage 3: Creatinine had been slowly rising at home with diuresis, suspect cardiorenal syndrome.  However, up to 3.3 at admission with marked hyperkalemia. No arrhythmias. K now normal. Anuric.  -  Continue trial of CVVH to see if we can decongest her then titrate off the CVVH. Would likely tolerate iHD poorly with severe RV failure. - Nephrology following. Appreciate their assistance  5. ID: CT chest with LLL infiltrate.  - Completed Ceftriaxone/azithromycin.  6. GI: developed constipation, N/V and abdominal distention. KUB 8/2 unremarkable. NGT out. Had BM yesterday. Reglan started   Length of Stay: [place], LINDSAY N, PA-C  11/27/2021, 8:54 AM  Advanced Heart Failure Team Pager [phone] (M-F; 7a - 5p)  Please contact [facility] for night-coverage after hours (5p -7a ) and weekends on [email or web address]   Patient seen with PA, agree with the above note.   CVVH ongoing with UF 100 cc/hr net.  CVP still 27. She remains on dobutamine 5 + NE 8 with  co-ox 71% and SBP 80s-90s.  I/Os net negative 1029.   General: NAD Neck: JVP 16 cm, no thyromegaly or thyroid nodule.  Lungs: Clear to auscultation bilaterally with normal respiratory effort. CV: Nondisplaced PMI.  Heart regular S1/S2, no S3/S4, no murmur.  1+ edema to thighs.  Abdomen: Soft, nontender, no hepatosplenomegaly, no distention.  Skin: Intact without lesions or rashes.  Neurologic: Alert and oriented x 3.  Psych: Normal affect. Extremities: No clubbing or cyanosis.  HEENT: Normal.   Continue dobutamine/NE at current doses.  I think we can try again to increase CVVH rate as she needs a lot of volume off.  Will increase to 150 cc/hr net negative UF today.   Eating some today, no further vomiting.  Had BM.    Work with PT.   CRITICAL CARE Performed by: Loralie Champagne  Total critical care time: 35 minutes  Critical care time was exclusive of separately billable procedures and treating other patients.  Critical care was necessary to treat or prevent imminent or life-threatening deterioration.  Critical care was time spent personally by me on the following activities: development of treatment plan with patient and/or surrogate as well as nursing, discussions with consultants, evaluation of patient's response to treatment, examination of patient, obtaining history from patient or surrogate, ordering and performing treatments and interventions, ordering and review of laboratory studies, ordering and review of radiographic studies, pulse oximetry and re-evaluation of patient's condition.  Loralie Champagne. 11/27/2021. 9:18 AM

## 2021-11-27 NOTE — Progress Notes (Signed)
Physical Therapy Treatment Patient Details Name: Shannon [name] MRN: [id number] DOB: 08/28/1965 Today's Date: 11/27/2021   History of Present Illness Pt is a 56 y.o. female admitted 11/19/2021 with SOB, progressive weakness, AMS. CXR with LLL atelectasis and consolidation. Workup for acute on chronic hypoxemic/hypercarbic respiratory failure due to decompensated R HF and possible LLL CAP. Pt with AKI; CRRT initiated 7/31. PMH includes CHF, DM, HTN, pulmonary HTN.   PT Comments    Pt progressing with mobility. Today's session focused on standing therex and pre-gait activity with RW; ambulation limited by CRRT/HHFNC. Despite fatigue, pt motivated to participate. Pt remains limited by generalized weakness, decreased activity tolerance, and impaired balance strategies/postural reactions. Will continue to follow acutely to address established goals.    Recommendations for follow up therapy are one component of a multi-disciplinary discharge planning process, led by the attending physician.  Recommendations may be updated based on patient status, additional functional criteria and insurance authorization.  Follow Up Recommendations  Home health PT     Assistance Recommended at Discharge Frequent or constant Supervision/Assistance  Patient can return home with the following A little help with walking and/or transfers;A little help with bathing/dressing/bathroom;Assistance with cooking/housework;Assist for transportation;Help with stairs or ramp for entrance   Equipment Recommendations   (TBD)    Recommendations for Other Services       Precautions / Restrictions Precautions Precautions: Fall;Other (comment) Precaution Comments: CRRT, heated HFNC (30L O2 at 70% FiO2) Restrictions Weight Bearing Restrictions: No     Mobility  Bed Mobility Overal bed mobility: Needs Assistance Bed Mobility: Sit to Supine     Supine to sit: Mod assist, +2 for safety/equipment     General bed mobility  comments: ModA for BLE management with return to supine, limited by amount of lines attached to pt, +2 assist from nursing for line management    Transfers Overall transfer level: Needs assistance Equipment used: 1 person hand held assist, Rolling walker (2 wheels) Transfers: Sit to/from Stand Sit to Stand: Min assist, +2 safety/equipment   Step pivot transfers: Min guard, +2 safety/equipment       General transfer comment: increased time and effort (suspect more related to lines attached), pt able to stand without assist, minA for HHA to stabilize upon standing; step pivot from recliner to bed with RW and min guard    Ambulation/Gait             Pre-gait activities: pt better able to stabilize and maintain upright posture with RW; able to perform bouts of static stand and marching in place with RW and min guard to minA for stability General Gait Details: unable secondary to CRRT/HHFNC, tolerated increased standing and pre-gait activity   Stairs             Wheelchair Mobility    Modified Rankin (Stroke Patients Only)       Balance Overall balance assessment: Needs assistance   Sitting balance-Leahy Scale: Fair     Standing balance support: No upper extremity supported, Bilateral upper extremity supported Standing balance-Leahy Scale: Fair Standing balance comment: static and dynamic stability improved with RW                            Cognition Arousal/Alertness: Awake/alert Behavior During Therapy: WFL for tasks assessed/performed, Flat affect Overall Cognitive Status: Within Functional Limits for tasks assessed  General Comments: WFL for simple tasks, not formally assessed. good awareness of lines with mobility        Exercises Other Exercises Other Exercises: pt tolerating >5 min standing activity with RW, including static standing, marching in place, heel raises (with knee flexion);  intermittent standing rest breaks secondary to fatigue    General Comments General comments (skin integrity, edema, etc.): pt's mom present and supportive      Pertinent Vitals/Pain Pain Assessment Pain Assessment: Faces Faces Pain Scale: Hurts a little bit Pain Location: buttocks from sitting in recliner Pain Descriptors / Indicators: Sore Pain Intervention(s): Repositioned    Home Living                          Prior Function            PT Goals (current goals can now be found in the care plan section) Progress towards PT goals: Progressing toward goals    Frequency    Min 3X/week      PT Plan Current plan remains appropriate    Co-evaluation              AM-PAC PT "6 Clicks" Mobility   Outcome Measure  Help needed turning from your back to your side while in a flat bed without using bedrails?: A Lot Help needed moving from lying on your back to sitting on the side of a flat bed without using bedrails?: A Lot Help needed moving to and from a bed to a chair (including a wheelchair)?: A Little Help needed standing up from a chair using your arms (e.g., wheelchair or bedside chair)?: A Little Help needed to walk in hospital room?: A Lot Help needed climbing 3-5 steps with a railing? : A Lot 6 Click Score: 14    End of Session Equipment Utilized During Treatment: Oxygen Activity Tolerance: Patient tolerated treatment well;Patient limited by fatigue Patient left: in chair;with call bell/phone within reach;with nursing/sitter in room;with family/visitor present Nurse Communication: Mobility status PT Visit Diagnosis: Other abnormalities of gait and mobility (R26.89);Muscle weakness (generalized) (M62.81)     Time: 7026-3785 PT Time Calculation (min) (ACUTE ONLY): 18 min  Charges:  $Therapeutic Exercise: 8-22 mins                      Mabeline Caras, PT, DPT Acute Rehabilitation Services  Personal: [facility] Office:  [place] 11/27/2021, 4:23 PM

## 2021-11-27 NOTE — Progress Notes (Signed)
Assessment/Plan:56 year old BF with severe pulmonary HTN and chronic volume overload-  presents with A on CRF and hyperkalemia in the setting of what appears to be decompensated right heart failure  1.Renal- CKD as an OP which has been worsening over the last few months in the setting of escalating diuresis.  Renal ultrasound shows a  ? small left kidney but no other issues, urine is bland.  This all appears to be a cardiorenal phenomenon.   Her potassium has come down with just holding her repletion but the main issue is volume overload.  I have discussed with Dr. [name] and aggressive attempt at diuresis with suboptimal response. Will initiate CRRT but not a good long term iHD candidate.  She is so young so reasonable to give a trial of CRRT to normalize volume and see if her kidneys can recover.  If they do not this is a difficult situation as intermittent dialysis in the setting of severe pulmonary HTN and right heart failure; these patients do not tolerate iHD.  Seen on CRRT (was on 200 for a few days) but then drop in [place] 8/2 -> net even and now 150. Currently on Dobutamine 5 + Levophed 8 + midodrine 15 TID. CVP 27. Net neg 10.8L during this hospitalization. 4K bath is fine.  2. Hyperkalemia-  due to AKI and high dose potassium repletion.  She has had hyperkalemia for several hours while here in the ER and has not had any arythmia or hemodynamic instability-  because dialysis is not tolerated in these types of patients and she was on 100 meq of potassium daily -  holding supps and treat medically -   is now WNL 3. Hypertension/volume (on low side now with aggressive UF) - clearly volume overloaded but very difficult to diurese also in patients with right sided heart failure-  per CCM and cards- plan was for lasix drip/metolazone but suboptimal response and now  giving trial of CRRT  4. Pulmonary-  possible PNA/atelectasis-  ABG showing resp acidosis-  now on bipap with improvement-  per CCM-  on  recephin and azithro  5. Anemia  - not a major issue at this time   Shannon [name]   Subjective:  fairly stable overnight-  tolerating UF 150  now on Dobutamine and Levophed + Midodrine.  CVP still reading high (27). She feels the breathing is improving.  Objective Vital signs in last 24 hours: Vitals:   11/27/21 0900 11/27/21 1000 11/27/21 1100 11/27/21 1129  BP: 100/73 93/64 100/80   Pulse: 90 90 86   Resp: 14 20 (!) 26   Temp:    (!) 97.3 F (36.3 C)  TempSrc:    Oral  SpO2:      Weight:      Height:       Weight change: 0.686 kg  Intake/Output Summary (Last 24 hours) at 11/27/2021 1148 Last data filed at 11/27/2021 1100 Gross per 24 hour  Intake 2395.3 ml  Output 4164 ml  Net -1768.7 ml       Labs: Basic Metabolic Panel: Recent Labs  Lab 11/26/21 0400 11/26/21 1600 11/26/21 2057 11/27/21 0411  NA 132* 129*  --  131*  K 4.2 4.2  --  4.0  CL 99 93*  --  97*  CO2 25 24  --  24  GLUCOSE 102* 142*  --  119*  BUN 8 9  --  7  CREATININE 1.00 0.91  --  0.82  CALCIUM 8.3*  8.3*  --  8.4*  PHOS 2.0* 3.2 2.7 2.4*   Liver Function Tests: Recent Labs  Lab 11/09/2021 1733 10/28/2021 2047 11/21/21 0307 11/23/21 1708 11/26/21 0400 11/26/21 1600 11/27/21 0411  AST _0 --   --   --   --   ALT _1 --   --   --   --   ALKPHOS 128* 135* 111  --   --   --   --   BILITOT 3.9* 3.7* 3.0*  --   --   --   --   PROT 8.2* 8.5* 6.9  --   --   --   --   ALBUMIN 3.6 3.7 3.0*   < > 3.0* 3.2* 3.0*   < > = values in this interval not displayed.   No results for input(s): "LIPASE", "AMYLASE" in the last 168 hours. Recent Labs  Lab 11/18/2021 2047 11/21/21 0346  AMMONIA 43* 41*   CBC: Recent Labs  Lab 11/17/2021 1450 11/04/2021 1530 11/24/21 0405 11/25/21 0434 11/25/21 1434 11/25/21 1639 11/26/21 0400 11/27/21 0411  WBC 7.2   < > 7.3 7.8 8.0  --  7.0 7.7  NEUTROABS 5.0  --   --   --   --   --   --   --   HGB 11.9*   < > 9.8* 10.0* 10.6* 12.9 9.8* 9.9*  HCT 35.1*    < > 28.9* 29.1* 30.7* 38.0 28.6* 28.5*  MCV 82.0   < > 82.8 82.7 82.3  --  83.4 81.4  PLT 317   < > 152 150 143*  --  123* 118*   < > = values in this interval not displayed.   Cardiac Enzymes: Recent Labs  Lab 11/21/21 1129  CKTOTAL 62   CBG: Recent Labs  Lab 11/26/21 1557 11/26/21 2105 11/26/21 2345 11/27/21 0409 11/27/21 1126  GLUCAP 139* 120* 117* 118* 152*    Iron Studies: No results for input(s): "IRON", "TIBC", "TRANSFERRIN", "FERRITIN" in the last 72 hours. Studies/Results: DG CHEST PORT 1 VIEW  Result Date: 11/26/2021 CLINICAL DATA:  Respiratory distress EXAM: PORTABLE CHEST 1 VIEW COMPARISON:  Chest radiograph 11/23/2021 FINDINGS: The right IJ vascular catheter is stable terminating in the region of the cavoatrial junction. A left-sided vascular catheter terminates in the right atrium. The enteric catheter tip is in the expected location of the third portion of the duodenum. The cardiomediastinal silhouette is stable. Lung volumes are low. Streaky opacities are noted in the left base, overall improved since the prior study. There is no new or worsening focal airspace disease. There is no pleural effusion or pneumothorax The bones are stable. IMPRESSION: Low lung volumes with streaky opacities in the left base, overall slightly improved in the interim. No new or worsening focal airspace disease. Electronically Signed   By: Valetta Mole M.D.   On: 11/26/2021 08:19   DG Abd Portable 1V  Result Date: 11/25/2021 CLINICAL DATA:  Nasogastric tube placement. EXAM: PORTABLE ABDOMEN - 1 VIEW COMPARISON:  Abdominal x-ray 11/25/2021 FINDINGS: Nasogastric tube tip is at the level of the gastric antrum. No evidence for bowel obstruction. Catheter is partially visualized in the distal SVC. IMPRESSION: Nasogastric tube tip is at the level of the gastric antrum. Electronically Signed   By: Ronney Asters M.D.   On: 11/25/2021 17:45   DG Abd Portable 1V  Result Date: 11/25/2021 CLINICAL DATA:   Constipation with nausea and vomiting. EXAM: PORTABLE ABDOMEN -  1 VIEW COMPARISON:  None Available. FINDINGS: The stomach is mildly distended, with air-filled bowel loops seen overlying the mid abdomen. A paucity of bowel gas is noted throughout the remainder of the abdomen and pelvis. There is no evidence of free air. This is limited in evaluation secondary to the patient's large body habitus. No dilated bowel loops are identified. A mild to moderate amount of stool is seen within the transverse colon. No radio-opaque calculi or other significant radiographic abnormality are seen. IMPRESSION: 1. Mild gastric distention without evidence of bowel obstruction. Further evaluation with abdomen and pelvis CT is recommended if clinical symptoms persist. Electronically Signed   By: Virgina Norfolk M.D.   On: 11/25/2021 15:31   Medications: Infusions:   prismasol BGK 4/2.5 500 mL/hr at 11/27/21 1101    prismasol BGK 4/2.5 300 mL/hr at 11/27/21 0449   sodium chloride     DOBUTamine 5 mcg/kg/min (11/27/21 1100)   norepinephrine (LEVOPHED) Adult infusion 8 mcg/min (11/27/21 1146)   prismasol BGK 4/2.5 1,500 mL/hr at 11/27/21 0830    Scheduled Medications:  Chlorhexidine Gluconate Cloth  6 each Topical Daily   famotidine  20 mg Oral Daily   heparin  5,000 Units Subcutaneous Q8H   insulin aspart  0-6 Units Subcutaneous Q4H   macitentan  10 mg Oral Daily   midodrine  15 mg Oral TID WC   mouth rinse  15 mL Mouth Rinse 4 times per day   polyethylene glycol  17 g Oral Daily   senna-docusate  1 tablet Oral BID   sildenafil  20 mg Oral TID    have reviewed scheduled and prn medications.  Physical Exam: General:  alert-  on HFNC Heart:tachy Lungs: decr movement, coarse rales present Abdomen: non tender Extremities: pitting edema to the thighs Dialysis Access: temp cath working well, no oozing     11/27/2021,11:48 AM  LOS: 7 days

## 2021-11-28 DIAGNOSIS — I50810 Right heart failure, unspecified: Secondary | ICD-10-CM | POA: Diagnosis not present

## 2021-11-28 DIAGNOSIS — J9601 Acute respiratory failure with hypoxia: Secondary | ICD-10-CM | POA: Diagnosis not present

## 2021-11-28 LAB — RENAL FUNCTION PANEL
Albumin: 2.9 g/dL — ABNORMAL LOW (ref 3.5–5.0)
Albumin: 3.2 g/dL — ABNORMAL LOW (ref 3.5–5.0)
Anion gap: 6 (ref 5–15)
Anion gap: 8 (ref 5–15)
BUN: 6 mg/dL (ref 6–20)
BUN: 6 mg/dL (ref 6–20)
CO2: 26 mmol/L (ref 22–32)
CO2: 24 mmol/L (ref 22–32)
Calcium: 7.9 mg/dL — ABNORMAL LOW (ref 8.9–10.3)
Calcium: 8 mg/dL — ABNORMAL LOW (ref 8.9–10.3)
Chloride: 98 mmol/L (ref 98–111)
Chloride: 98 mmol/L (ref 98–111)
Creatinine, Ser: 0.86 mg/dL (ref 0.44–1.00)
Creatinine, Ser: 0.71 mg/dL (ref 0.44–1.00)
GFR, Estimated: >60 mL/min (ref >60)
GFR, Estimated: >60 mL/min (ref >60)
Glucose, Bld: 142 mg/dL — ABNORMAL HIGH (ref 70–99)
Glucose, Bld: 110 mg/dL — ABNORMAL HIGH (ref 70–99)
Phosphorus: 1.4 mg/dL — ABNORMAL LOW (ref 2.5–4.6)
Phosphorus: 2.9 mg/dL (ref 2.5–4.6)
Potassium: 4.3 mmol/L (ref 3.5–5.1)
Potassium: 4.1 mmol/L (ref 3.5–5.1)
Sodium: 130 mmol/L — ABNORMAL LOW (ref 135–145)
Sodium: 130 mmol/L — ABNORMAL LOW (ref 135–145)

## 2021-11-28 LAB — COOXEMETRY PANEL
Carboxyhemoglobin: 1.5 % (ref 0.5–1.5)
Methemoglobin: <0.7 % (ref 0.0–1.5)
O2 Saturation: 59.4 %
Total hemoglobin: 9.7 g/dL — ABNORMAL LOW (ref 12.0–16.0)

## 2021-11-28 LAB — GLUCOSE, CAPILLARY
Glucose-Capillary: 181 mg/dL — ABNORMAL HIGH (ref 70–99)
Glucose-Capillary: 144 mg/dL — ABNORMAL HIGH (ref 70–99)
Glucose-Capillary: 133 mg/dL — ABNORMAL HIGH (ref 70–99)
Glucose-Capillary: 136 mg/dL — ABNORMAL HIGH (ref 70–99)
Glucose-Capillary: 113 mg/dL — ABNORMAL HIGH (ref 70–99)

## 2021-11-28 LAB — PHOSPHORUS: Phosphorus: 2.8 mg/dL (ref 2.5–4.6)

## 2021-11-28 LAB — CBC
HCT: 26.5 % — ABNORMAL LOW (ref 36.0–46.0)
Hemoglobin: 9.2 g/dL — ABNORMAL LOW (ref 12.0–15.0)
MCH: 28.5 pg (ref 26.0–34.0)
MCHC: 34.7 g/dL (ref 30.0–36.0)
MCV: 82 fL (ref 80.0–100.0)
Platelets: 115 10*3/uL — ABNORMAL LOW (ref 150–400)
RBC: 3.23 MIL/uL — ABNORMAL LOW (ref 3.87–5.11)
RDW: 18.8 % — ABNORMAL HIGH (ref 11.5–15.5)
WBC: 10 10*3/uL (ref 4.0–10.5)
nRBC: 3.3 % — ABNORMAL HIGH (ref 0.0–0.2)

## 2021-11-28 LAB — MAGNESIUM: Magnesium: 2.3 mg/dL (ref 1.7–2.4)

## 2021-11-28 MED ORDER — SODIUM CHLORIDE 0.9 % IV SOLN
500.0000 [IU]/h | INTRAVENOUS | Status: DC
  Administered 2021-11-28 – 2021-11-29 (×2): 500 [IU]/h via INTRAVENOUS_CENTRAL
  Filled 2021-11-28: qty 10000
  Filled 2021-11-28 (×2): qty 2

## 2021-11-28 MED ORDER — POTASSIUM PHOSPHATES 15 MMOLE/5ML IV SOLN: 45.0000 mmol | Freq: Once | INTRAVENOUS | Status: DC

## 2021-11-28 MED ORDER — SODIUM PHOSPHATES 45 MMOLE/15ML IV SOLN
45.0000 mmol | Freq: Once | INTRAVENOUS | Status: AC
  Administered 2021-11-28: 45 mmol via INTRAVENOUS
  Filled 2021-11-28: qty 15

## 2021-11-28 NOTE — Progress Notes (Signed)
[facility] Progress Note Patient Name: Shannon [name] DOB: 1965-05-21 MRN: [id number]   Date of Service  11/28/2021  HPI/Events of Note  previosly had fole and now not voiding with retention per bladder scan.   I had nurse go ahead and do inand out based on the policy.   She met resistance so was unable to complete.  She got about 25cc back but it was blood  eICU Interventions  Rpt bladder scan in 4-6h -can be addressed on am rounds     Intervention Category Intermediate Interventions: Oliguria - evaluation and management  Lashondra Vaquerano V. Chevie Birkhead 11/28/2021, 4:26 AM

## 2021-11-28 NOTE — Progress Notes (Signed)
Pharmacy Electrolyte Replacement  Recent Labs:  Recent Labs    11/28/21 0447  K 4.3  MG 2.3  PHOS 1.4*  CREATININE 0.86    Low Critical Values (K </= 2.5, Phos </= 1, Mg </= 1) Present: None  MD Contacted: n/a - no critical values noted  Plan: NaPhos 5mol IV x 1 Recheck Phos at 2000 per protocol   HArturo Morton PharmD, BCPS Please check AMION for all [place] Clinical Pharmacist 11/28/2021 6:03 AM
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
## 2021-11-28 NOTE — Progress Notes (Signed)
Patient ID: Shannon [name], female   DOB: 1965/06/02, 56 y.o.   MRN: [id number]     Advanced Heart Failure Rounding Note  PCP-Cardiologist: Fransico Him, MD   Subjective:    7/31: started on CRRT 8/1: NE added for BP support to facilitate CRRT volume removal  8/22: Co-ox 41% (confirmed x 2), DBA increased to 5.  constipation, N/V + abd distention. KUB unremarkable. NGT placed. Reglan started   Continues on 5 DBA + 10 NE. CO-OX 59%.   CVP 29. I/Os net negative 3057 with weight down. Now pulling for net negative UF 150 cc/hr via CVVH. SBP ranging 80s-100s.  Bipap at night, HFNC during the day.   Bladder scan with 400 cc and feels pressure, unable to void.  Nurse tried to place foley but unable, patient bleeding after attempt.   Starting to feel better.   Echo: EF 60-65%, D-shaped septum, moderate RV enlargement/moderate RV dysfunction, PASP 59, mod-severe TR, IVC dilated.    Objective:   Weight Range: 83.7 kg Body mass index is 36.03 kg/m.   Vital Signs:   Temp:  [96.9 F (36.1 C)-100.7 F (38.2 C)] 96.9 F (36.1 C) (08/05 0700) Pulse Rate:  [58-97] 81 (08/05 0700) Resp:  [14-27] 18 (08/05 0700) BP: (80-105)/(47-80) 90/47 (08/05 0700) SpO2:  [92 %-98 %] 96 % (08/05 0600) FiO2 (%):  [50 %-70 %] 60 % (08/05 0400) Weight:  [83.7 kg] 83.7 kg (08/05 0400) Last BM Date : 11/26/21  Weight change: Filed Weights   11/26/21 0630 11/27/21 0500 11/28/21 0400  Weight: 88.4 kg 89.1 kg 83.7 kg    Intake/Output:   Intake/Output Summary (Last 24 hours) at 11/28/2021 0820 Last data filed at 11/28/2021 0800 Gross per 24 hour  Intake 1974.15 ml  Output 5225 ml  Net -3250.85 ml      Physical Exam  CVP 29 General: NAD Neck: JVP 16+, no thyromegaly or thyroid nodule.  Lungs: Clear to auscultation bilaterally with normal respiratory effort. CV: Nondisplaced PMI.  Heart regular S1/S2, +right-sided S3, no murmur.  1+ edema to knees.  Abdomen: Soft, nontender, no  hepatosplenomegaly, no distention.  Skin: Intact without lesions or rashes.  Neurologic: Alert and oriented x 3.  Psych: Normal affect. Extremities: No clubbing or cyanosis.  HEENT: Normal.   Telemetry   SR 80s, intermittently has PVCs in bigeminy pattern  Labs    CBC Recent Labs    11/27/21 0411 11/28/21 0447  WBC 7.7 10.0  HGB 9.9* 9.2*  HCT 28.5* 26.5*  MCV 81.4 82.0  PLT 118* 782*   Basic Metabolic Panel Recent Labs    11/27/21 0411 11/27/21 1712 11/28/21 0447  NA 131* 129* 130*  K 4.0 4.3 4.3  CL 97* 96* 98  CO2 _0 GLUCOSE 119* 146* 142*  BUN _1 CREATININE 0.82 0.86 0.86  CALCIUM 8.4* 8.4* 7.9*  MG 2.3  --  2.3  PHOS 2.4* 2.1* 1.4*   Liver Function Tests Recent Labs    11/27/21 1712 11/28/21 0447  ALBUMIN 3.2* 2.9*   No results for input(s): "LIPASE", "AMYLASE" in the last 72 hours. Cardiac Enzymes No results for input(s): "CKTOTAL", "CKMB", "CKMBINDEX", "TROPONINI" in the last 72 hours.   BNP: BNP (last 3 results) Recent Labs    09/10/21 1614 11/11/21 1620 11/11/2021 1510  BNP 916.7* 1,063.1* 1,014.0*    ProBNP (last 3 results) No results for input(s): "PROBNP" in the last 8760 hours.   D-Dimer No results for input(s): "  DDIMER" in the last 72 hours. Hemoglobin A1C No results for input(s): "HGBA1C" in the last 72 hours.  Fasting Lipid Panel No results for input(s): "CHOL", "HDL", "LDLCALC", "TRIG", "CHOLHDL", "LDLDIRECT" in the last 72 hours. Thyroid Function Tests No results for input(s): "TSH", "T4TOTAL", "T3FREE", "THYROIDAB" in the last 72 hours.  Invalid input(s): "FREET3"  Other results:   Imaging    No results found.   Medications:     Scheduled Medications:  (feeding supplement) PROSource Plus  30 mL Oral BID BM   Chlorhexidine Gluconate Cloth  6 each Topical Daily   famotidine  20 mg Oral Daily   feeding supplement (NEPRO CARB STEADY)  237 mL Oral Q2000   Gerhardt's butt cream   Topical TID    heparin  5,000 Units Subcutaneous Q8H   insulin aspart  0-6 Units Subcutaneous Q4H   macitentan  10 mg Oral Daily   midodrine  15 mg Oral TID WC   multivitamin  1 tablet Oral QHS   mouth rinse  15 mL Mouth Rinse 4 times per day   polyethylene glycol  17 g Oral Daily   senna-docusate  1 tablet Oral BID   sildenafil  20 mg Oral TID    Infusions:   prismasol BGK 4/2.5 500 mL/hr at 11/27/21 2113    prismasol BGK 4/2.5 300 mL/hr at 11/27/21 2242   sodium chloride Stopped (11/28/21 0758)   DOBUTamine 5 mcg/kg/min (11/28/21 0800)   norepinephrine (LEVOPHED) Adult infusion 10 mcg/min (11/28/21 0800)   prismasol BGK 4/2.5 1,500 mL/hr at 11/28/21 0704   sodium phosphate 45 mmol in dextrose 5 % 250 mL infusion 44 mL/hr at 11/28/21 0800    PRN Medications: Place/Maintain arterial line **AND** sodium chloride, bisacodyl, docusate, heparin, ondansetron (ZOFRAN) IV, mouth rinse, polyethylene glycol, simethicone, sodium chloride, white petrolatum    Assessment/Plan   1. Pulmonary hypertension/RV failure: Echo in 11/22 showed EF 60-65%, mild LVH, severe RV enlargement with severely decreased RV systolic function, PASP 66 mmHg, severe RAE, moderate TR, moderate PR, small pericardial effusion. CTA chest showed no PE, normal lung fields and V/Q scan showed no evidence for chronic PE.  PFTs showed severe restriction and decreased DLCO.  RHC/LHC showed severe pulmonary arterial HTN with RV failure.  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  No history of liver disease.  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  ANA, SCL-70, anti-centromere antibody, and RF negative with elevated CRP. She may have connective tissue disease-related PAH though she saw rheumatology and no definite diagnosis was given.  High resolution CT chest showed no evidence for ILD.  She was admitted this time with worsening RV failure and AKI.  Echo this admission with EF 60-65%, D-shaped septum, moderate RV enlargement/moderate RV dysfunction, PASP 59, mod-severe TR, IVC dilated. CVP is still 29 this morning, marked volume overload on exam.  She was started on dobutamine 2.5 for RV support, ultimately further titrated to 5 after Co-ox dropped to 40%. Oliguric despite high dose lasix gtt at 30/hr. Started on CRRT 7/31 for intractable volume overload. This morning on DBA 5 + 10 NE. Co-ox 59%.  Good I/Os yesterday and weight coming down, pulling UF net negative 150 cc/hr via CVVH.  - Continue DBA + NE.  - Continue to decongest RV with markedly elevated CVP, tolerating CVVH net UF 150 cc/hr.  - Continue midodrine 15 tid.  - Continue Opsumit 10  mg daily.  - Transitioned tadalafil to sildenafil with AKI, on 20 mg tid.  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 She does not report myalgias or muscle pain.  Myositis panel was negative.  She saw rheumatology and no definite diagnosis was given. CK normal at 62 this admission.  3. Acute on chronic hypoxemic respiratory failure: On 2L home oxygen at baseline, currently on HFNC with Bipap at night.  Off antibiotics.  4. AKI on CKD stage 3: Creatinine had been slowly rising at home with diuresis, suspect cardiorenal syndrome.  However, up to 3.3 at admission with marked hyperkalemia. No arrhythmias. K now normal. Anuric.  - Continue trial of CVVH to see if we can decongest her then titrate off the CVVH. Would likely tolerate iHD poorly with severe RV failure. - Nephrology following. Appreciate their assistance  5. ID: CT chest with LLL infiltrate.  - Completed Ceftriaxone/azithromycin.  6. Difficult foley: 400 cc urine in bladder on scan and feels full, nurse unable to place foley and had bleeding.   - Will ask urology to help with foley placement.    CRITICAL CARE Performed by: Loralie Champagne  Total critical care time: 35 minutes  Critical care time was exclusive of separately billable procedures and treating other patients.  Critical care was necessary to treat or prevent imminent or life-threatening deterioration.  Critical care was time spent personally by me on the following activities: development of treatment plan with patient and/or surrogate as well as nursing, discussions with consultants, evaluation of patient's response to treatment, examination of patient, obtaining history from patient or surrogate, ordering and performing treatments and interventions, ordering and review of laboratory studies, ordering and review of radiographic studies, pulse oximetry and re-evaluation of patient's condition.   Length of Stay: 8  Loralie Champagne, MD  11/28/2021, 8:20 AM  Advanced Heart Failure Team Pager [phone] (M-F; 7a - 5p)  Please contact [facility] for night-coverage after hours (5p -7a ) and weekends on [email or web address]

## 2021-11-28 NOTE — Progress Notes (Signed)
[facility] ADULT ICU REPLACEMENT PROTOCOL   The patient does apply for the [facility] Adult ICU Electrolyte Replacment Protocol based on the criteria listed below:   1.Exclusion criteria: TCTS patients, ECMO patients, and Dialysis patients 2. Is GFR >/= 30 ml/min? Yes.    Patient's GFR today is >60 3. Is SCr </= 2? Yes.   Patient's SCr is 0.86 mg/dL 4. Did SCr increase >/= 0.5 in 24 hours? No. 5.Pt's weight >40kg  Yes.   6. Abnormal electrolyte(s): Phos 1.4  7. Electrolytes replaced per protocol 8.  Call MD STAT for K+ </= 2.5, Phos </= 1, or Mag </= 1 Physician:    Ronda Fairly A 11/28/2021 5:56 AM

## 2021-11-28 NOTE — Progress Notes (Signed)
Assessment/Plan:56 year old BF with severe pulmonary HTN and chronic volume overload-  presents with A on CRF and hyperkalemia in the setting of what appears to be decompensated right heart failure  1.Renal- CKD as an OP which has been worsening over the last few months in the setting of escalating diuresis.  Renal ultrasound shows a  ? small left kidney but no other issues, urine is bland.  This all appears to be a cardiorenal phenomenon.   Her potassium has come down with just holding her repletion but the main issue is volume overload.  I have discussed with Dr. [name] and aggressive attempt at diuresis with suboptimal response. Will initiate CRRT but not a good long term iHD candidate.  She is so young so reasonable to give a trial of CRRT to normalize volume and see if her kidneys can recover.  If they do not this is a difficult situation as intermittent dialysis in the setting of severe pulmonary HTN and right heart failure; these patients do not tolerate iHD.  Seen on CRRT (was on 200 for a few days) but then drop in Coox 8/2 -> net even and now 132m/hr. Currently on Dobutamine 5 + Levophed 10 + midodrine 15 TID. CVP 27-29. Net neg 13.3 L during this hospitalization. 4K bath is fine.  Agree with continuing at 1551mhr. Agree with urology that most likely ascites; not likely she would have significant UOP being on CRRT.  2. Hyperkalemia-  due to AKI and high dose potassium repletion.  She has had hyperkalemia for several hours while here in the ER and has not had any arythmia or hemodynamic instability-  because dialysis is not tolerated in these types of patients and she was on 100 meq of potassium daily -  holding supps and treat medically -   is now WNL 3. Hypertension/volume (on low side now with aggressive UF) - clearly volume overloaded but very difficult to diurese also in patients with right sided heart failure-  per CCM and cards- plan was for lasix drip/metolazone but suboptimal response  and now  giving trial of CRRT  4. Pulmonary-  possible PNA/atelectasis-  ABG showing resp acidosis-  now on bipap with improvement-  per CCM-  on recephin and azithro  5. Anemia  - not a major issue at this time   Shannon [name]   Subjective:  fairly stable overnight-  tolerating UF 150  now on Dobutamine and Levophed + Midodrine.  CVP still reading high (27-29). She feels the breathing is improving and is sitting comfortably in a chair.  Objective Vital signs in last 24 hours: Vitals:   11/28/21 0838 11/28/21 0900 11/28/21 1000 11/28/21 1100  BP: 103/64 (!) 65/63 (!) 84/55 98/67  Pulse: 80 80 86 86  Resp: _0 Temp:    (!) 96.9 F (36.1 C)  TempSrc:    Axillary  SpO2:      Weight:      Height:       Weight change: -5.398 kg  Intake/Output Summary (Last 24 hours) at 11/28/2021 1152 Last data filed at 11/28/2021 1100 Gross per 24 hour  Intake 1947.18 ml  Output 4436 ml  Net -2488.82 ml       Labs: Basic Metabolic Panel: Recent Labs  Lab 11/27/21 0411 11/27/21 1712 11/28/21 0447  NA 131* 129* 130*  K 4.0 4.3 4.3  CL 97* 96* 98  CO2 _1 GLUCOSE 119* 146* 142*  BUN 7 6  6  CREATININE 0.82 0.86 0.86  CALCIUM 8.4* 8.4* 7.9*  PHOS 2.4* 2.1* 1.4*   Liver Function Tests: Recent Labs  Lab 11/27/21 0411 11/27/21 1712 11/28/21 0447  ALBUMIN 3.0* 3.2* 2.9*   No results for input(s): "LIPASE", "AMYLASE" in the last 168 hours. No results for input(s): "AMMONIA" in the last 168 hours.  CBC: Recent Labs  Lab 11/25/21 0434 11/25/21 1434 11/25/21 1639 11/26/21 0400 11/27/21 0411 11/28/21 0447  WBC 7.8 8.0  --  7.0 7.7 10.0  HGB 10.0* 10.6*   < > 9.8* 9.9* 9.2*  HCT 29.1* 30.7*   < > 28.6* 28.5* 26.5*  MCV 82.7 82.3  --  83.4 81.4 82.0  PLT 150 143*  --  123* 118* 115*   < > = values in this interval not displayed.   Cardiac Enzymes: No results for input(s): "CKTOTAL", "CKMB", "CKMBINDEX", "TROPONINI" in the last 168 hours.  CBG: Recent Labs   Lab 11/27/21 1554 11/27/21 1947 11/27/21 2346 11/28/21 0342 11/28/21 0655  GLUCAP 142* 145* 181* 144* 133*    Iron Studies: No results for input(s): "IRON", "TIBC", "TRANSFERRIN", "FERRITIN" in the last 72 hours. Studies/Results: No results found. Medications: Infusions:   prismasol BGK 4/2.5 500 mL/hr at 11/28/21 0851    prismasol BGK 4/2.5 300 mL/hr at 11/27/21 2242   sodium chloride Stopped (11/28/21 0758)   DOBUTamine 5 mcg/kg/min (11/28/21 1100)   heparin 10,000 units/ 20 mL infusion syringe     norepinephrine (LEVOPHED) Adult infusion 10 mcg/min (11/28/21 1100)   prismasol BGK 4/2.5 1,500 mL/hr at 11/28/21 0704   sodium phosphate 45 mmol in dextrose 5 % 250 mL infusion 44 mL/hr at 11/28/21 1100    Scheduled Medications:  (feeding supplement) PROSource Plus  30 mL Oral BID BM   Chlorhexidine Gluconate Cloth  6 each Topical Daily   famotidine  20 mg Oral Daily   feeding supplement (NEPRO CARB STEADY)  237 mL Oral Q2000   Gerhardt's butt cream   Topical TID   heparin  5,000 Units Subcutaneous Q8H   insulin aspart  0-6 Units Subcutaneous Q4H   macitentan  10 mg Oral Daily   midodrine  15 mg Oral TID WC   multivitamin  1 tablet Oral QHS   mouth rinse  15 mL Mouth Rinse 4 times per day   polyethylene glycol  17 g Oral Daily   senna-docusate  1 tablet Oral BID   sildenafil  20 mg Oral TID    have reviewed scheduled and prn medications.  Physical Exam: General:  alert-  on HFNC Heart:tachy Lungs: decr movement, clear Abdomen: non tender Extremities: pitting edema to the thighs but much less than earlier in the week Dialysis Access: temp cath working well, no oozing     11/28/2021,11:52 AM  LOS: 8 days

## 2021-11-29 ENCOUNTER — Inpatient Hospital Stay (HOSPITAL_COMMUNITY): Payer: 59

## 2021-11-29 DIAGNOSIS — J9601 Acute respiratory failure with hypoxia: Secondary | ICD-10-CM | POA: Diagnosis not present

## 2021-11-29 DIAGNOSIS — I50810 Right heart failure, unspecified: Secondary | ICD-10-CM | POA: Diagnosis not present

## 2021-11-29 DIAGNOSIS — N179 Acute kidney failure, unspecified: Secondary | ICD-10-CM | POA: Diagnosis not present

## 2021-11-29 LAB — RENAL FUNCTION PANEL
Albumin: 3 g/dL — ABNORMAL LOW (ref 3.5–5.0)
Albumin: 3.2 g/dL — ABNORMAL LOW (ref 3.5–5.0)
Anion gap: 8 (ref 5–15)
Anion gap: 5 (ref 5–15)
BUN: 5 mg/dL — ABNORMAL LOW (ref 6–20)
BUN: 6 mg/dL (ref 6–20)
CO2: 25 mmol/L (ref 22–32)
CO2: 28 mmol/L (ref 22–32)
Calcium: 8.1 mg/dL — ABNORMAL LOW (ref 8.9–10.3)
Calcium: 8.6 mg/dL — ABNORMAL LOW (ref 8.9–10.3)
Chloride: 97 mmol/L — ABNORMAL LOW (ref 98–111)
Chloride: 99 mmol/L (ref 98–111)
Creatinine, Ser: 0.7 mg/dL (ref 0.44–1.00)
Creatinine, Ser: 0.72 mg/dL (ref 0.44–1.00)
GFR, Estimated: >60 mL/min (ref >60)
GFR, Estimated: >60 mL/min (ref >60)
Glucose, Bld: 166 mg/dL — ABNORMAL HIGH (ref 70–99)
Glucose, Bld: 128 mg/dL — ABNORMAL HIGH (ref 70–99)
Phosphorus: 2.2 mg/dL — ABNORMAL LOW (ref 2.5–4.6)
Phosphorus: 1.8 mg/dL — ABNORMAL LOW (ref 2.5–4.6)
Potassium: 4.2 mmol/L (ref 3.5–5.1)
Potassium: 4.4 mmol/L (ref 3.5–5.1)
Sodium: 130 mmol/L — ABNORMAL LOW (ref 135–145)
Sodium: 132 mmol/L — ABNORMAL LOW (ref 135–145)

## 2021-11-29 LAB — GLUCOSE, CAPILLARY
Glucose-Capillary: 116 mg/dL — ABNORMAL HIGH (ref 70–99)
Glucose-Capillary: 134 mg/dL — ABNORMAL HIGH (ref 70–99)
Glucose-Capillary: 138 mg/dL — ABNORMAL HIGH (ref 70–99)
Glucose-Capillary: 143 mg/dL — ABNORMAL HIGH (ref 70–99)
Glucose-Capillary: 150 mg/dL — ABNORMAL HIGH (ref 70–99)
Glucose-Capillary: 152 mg/dL — ABNORMAL HIGH (ref 70–99)
Glucose-Capillary: 163 mg/dL — ABNORMAL HIGH (ref 70–99)

## 2021-11-29 LAB — CBC
HCT: 26.2 % — ABNORMAL LOW (ref 36.0–46.0)
Hemoglobin: 8.8 g/dL — ABNORMAL LOW (ref 12.0–15.0)
MCH: 28.2 pg (ref 26.0–34.0)
MCHC: 33.6 g/dL (ref 30.0–36.0)
MCV: 84 fL (ref 80.0–100.0)
Platelets: 98 10*3/uL — ABNORMAL LOW (ref 150–400)
RBC: 3.12 MIL/uL — ABNORMAL LOW (ref 3.87–5.11)
RDW: 18.8 % — ABNORMAL HIGH (ref 11.5–15.5)
WBC: 19.9 10*3/uL — ABNORMAL HIGH (ref 4.0–10.5)
nRBC: 2 % — ABNORMAL HIGH (ref 0.0–0.2)

## 2021-11-29 LAB — MAGNESIUM: Magnesium: 2.2 mg/dL (ref 1.7–2.4)

## 2021-11-29 LAB — COOXEMETRY PANEL
Carboxyhemoglobin: 0.9 % (ref 0.5–1.5)
Methemoglobin: 1.2 % (ref 0.0–1.5)
O2 Saturation: 60.7 %
Total hemoglobin: 9.4 g/dL — ABNORMAL LOW (ref 12.0–16.0)

## 2021-11-29 LAB — APTT: aPTT: 141 seconds — ABNORMAL HIGH (ref 24–36)

## 2021-11-29 MED ORDER — NOREPINEPHRINE 16 MG/250ML-% IV SOLN
0.0000 ug/min | INTRAVENOUS | Status: DC
  Administered 2021-11-29: 20 ug/min via INTRAVENOUS
  Administered 2021-11-29: 26 ug/min via INTRAVENOUS
  Administered 2021-11-30: 30 ug/min via INTRAVENOUS
  Administered 2021-11-30: 40 ug/min via INTRAVENOUS
  Administered 2021-11-30: 36 ug/min via INTRAVENOUS
  Administered 2021-12-01: 60 ug/min via INTRAVENOUS
  Administered 2021-12-01: 40 ug/min via INTRAVENOUS
  Filled 2021-11-29 (×3): qty 250
  Filled 2021-11-29: qty 500
  Filled 2021-11-29 (×2): qty 250

## 2021-11-29 MED ORDER — SODIUM PHOSPHATES 45 MMOLE/15ML IV SOLN
45.0000 mmol | Freq: Once | INTRAVENOUS | Status: AC
  Administered 2021-11-29: 45 mmol via INTRAVENOUS
  Filled 2021-11-29: qty 15

## 2021-11-29 NOTE — Progress Notes (Signed)
Assessment/Plan:56 year old BF with severe pulmonary HTN and chronic volume overload-  presents with A on CRF and hyperkalemia in the setting of what appears to be decompensated right heart failure  1.Renal- CKD as an OP which has been worsening over the last few months in the setting of escalating diuresis.  Renal ultrasound shows a  ? small left kidney but no other issues, urine is bland.  This all appears to be a cardiorenal phenomenon.   Her potassium has come down with just holding her repletion but the main issue is volume overload.  I have discussed with Dr. [name] and aggressive attempt at diuresis with suboptimal response. Will initiate CRRT but not a good long term iHD candidate.  She is so young so reasonable to give a trial of CRRT to normalize volume and see if her kidneys can recover.  If they do not this is a difficult situation as intermittent dialysis in the setting of severe pulmonary HTN and right heart failure; these patients do not tolerate iHD.  Seen on CRRT (was on 200 for a few days) but then drop in Coox 8/2 -> net even and now 147m/hr. Currently on Dobutamine 5 + Levophed 24 + midodrine 15 TID. CVP 26. Net neg 17.4 L during this hospitalization. 4K bath is fine.  Agree with continuing at 1576mhr.   2. Hyperkalemia-  due to AKI and high dose potassium repletion.  She has had hyperkalemia for several hours while here in the ER and has not had any arythmia or hemodynamic instability-  because dialysis is not tolerated in these types of patients and she was on 100 meq of potassium daily -  holding supps and treat medically -   is now WNL 3. Hypertension/volume (on low side now with aggressive UF) - clearly volume overloaded but very difficult to diurese also in patients with right sided heart failure-  per CCM and cards- plan was for lasix drip/metolazone but suboptimal response and now  giving trial of CRRT  4. Pulmonary-  possible PNA/atelectasis-  ABG showing resp acidosis-  now  on bipap with improvement 5. Anemia  - not a major issue at this time   Jorgia Manthei W   Subjective:  fairly stable overnight-  tolerating UF 150  now on Dobutamine and high dose Levophed (escalating doses) + Midodrine.  CVP still reading high (26). She feels the breathing is improving and is sitting at a 45deg in bed  Objective Vital signs in last 24 hours: Vitals:   11/29/21 0900 11/29/21 1000 11/29/21 1100 11/29/21 1145  BP: (!) 79/59 (!) 80/54 (!) 70/47   Pulse: 83 84 90 89  Resp: _0 Temp:      TempSrc:      SpO2:      Weight:      Height:       Weight change: 2.011 kg  Intake/Output Summary (Last 24 hours) at 11/29/2021 1244 Last data filed at 11/29/2021 1200 Gross per 24 hour  Intake 2730.64 ml  Output 6650 ml  Net -3919.36 ml       Labs: Basic Metabolic Panel: Recent Labs  Lab 11/28/21 0447 11/28/21 2039 11/29/21 0351  NA 130* 130* 130*  K 4.3 4.1 4.2  CL 98 98 97*  CO2 _1 GLUCOSE 142* 110* 166*  BUN 6 6 5*  CREATININE 0.86 0.71 0.70  CALCIUM 7.9* 8.0* 8.1*  PHOS 1.4* 2.9  2.8 2.2*   Liver Function Tests:  Recent Labs  Lab 11/28/21 0447 11/28/21 2039 11/29/21 0351  ALBUMIN 2.9* 3.2* 3.0*   No results for input(s): "LIPASE", "AMYLASE" in the last 168 hours. No results for input(s): "AMMONIA" in the last 168 hours.  CBC: Recent Labs  Lab 11/25/21 1434 11/25/21 1639 11/26/21 0400 11/27/21 0411 11/28/21 0447 11/29/21 0351  WBC 8.0  --  7.0 7.7 10.0 19.9*  HGB 10.6*   < > 9.8* 9.9* 9.2* 8.8*  HCT 30.7*   < > 28.6* 28.5* 26.5* 26.2*  MCV 82.3  --  83.4 81.4 82.0 84.0  PLT 143*  --  123* 118* 115* 98*   < > = values in this interval not displayed.   Cardiac Enzymes: No results for input(s): "CKTOTAL", "CKMB", "CKMBINDEX", "TROPONINI" in the last 168 hours.  CBG: Recent Labs  Lab 11/28/21 2037 11/29/21 0031 11/29/21 0354 11/29/21 0652 11/29/21 1213  GLUCAP 113* 134* 152* 150* 163*    Iron Studies: No results for  input(s): "IRON", "TIBC", "TRANSFERRIN", "FERRITIN" in the last 72 hours. Studies/Results: DG CHEST PORT 1 VIEW  Result Date: 11/29/2021 CLINICAL DATA:  Respiratory distress.  Pneumonia. EXAM: PORTABLE CHEST 1 VIEW COMPARISON:  Radiographs 11/26/2021 and 11/23/2021.  CT 10/30/2021. FINDINGS: 1028 hours. Enteric tube has been removed. Left subclavian central venous catheter projects to the level of the mid right atrium, unchanged. Interval exchange of the right IJ central venous catheter, projecting to the level of the superior cavoatrial junction. The heart size and mediastinal contours are stable. There are lower lung volumes with increasing bibasilar pulmonary opacities and poor definition of the pulmonary vasculature. There are possible small bilateral pleural effusions. No evidence of pneumothorax. No acute osseous findings are evident. Telemetry leads overlie the chest. IMPRESSION: 1. Increasing bibasilar pulmonary opacities with probable pulmonary edema and small pleural effusions. 2. Support system positioned as above.  No pneumothorax. Electronically Signed   By: Richardean Sale M.D.   On: 11/29/2021 12:04   Medications: Infusions:   prismasol BGK 4/2.5 500 mL/hr at 11/29/21 0624    prismasol BGK 4/2.5 300 mL/hr at 11/29/21 0448   sodium chloride Stopped (11/28/21 0758)   DOBUTamine 5 mcg/kg/min (11/29/21 1200)   heparin 10,000 units/ 20 mL infusion syringe 500 Units/hr (11/29/21 1100)   norepinephrine (LEVOPHED) Adult infusion 24 mcg/min (11/29/21 1200)   prismasol BGK 4/2.5 1,500 mL/hr at 11/29/21 1113    Scheduled Medications:  (feeding supplement) PROSource Plus  30 mL Oral BID BM   Chlorhexidine Gluconate Cloth  6 each Topical Daily   famotidine  20 mg Oral Daily   feeding supplement (NEPRO CARB STEADY)  237 mL Oral Q2000   Gerhardt's butt cream   Topical TID   heparin  5,000 Units Subcutaneous Q8H   insulin aspart  0-6 Units Subcutaneous Q4H   macitentan  10 mg Oral Daily    midodrine  15 mg Oral TID WC   multivitamin  1 tablet Oral QHS   mouth rinse  15 mL Mouth Rinse 4 times per day   polyethylene glycol  17 g Oral Daily   senna-docusate  1 tablet Oral BID   sildenafil  20 mg Oral TID    have reviewed scheduled and prn medications.  Physical Exam: General:  alert Heart:tachy Lungs: decr movement, clear Abdomen: non tender Extremities: pitting edema to the thighs but much less than earlier in the week, UNNA boots Dialysis Access: temp cath working well, no oozing     11/29/2021,12:44 PM  LOS: 9 days

## 2021-11-29 NOTE — Progress Notes (Signed)
Patient ID: Shannon [name], female   DOB: [birth year], 56 y.o.   MRN: [id number]     Advanced Heart Failure Rounding Note  PCP-Cardiologist: Fransico Him, MD   Subjective:    7/31: started on CRRT 8/1: NE added for BP support to facilitate CRRT volume removal  8/22: Co-ox 41% (confirmed x 2), DBA increased to 5.  constipation, N/V + abd distention. KUB unremarkable. NGT placed. Reglan started   Continues on 5 DBA + 14 NE. CO-OX 61%.   CVP 26 (lower). I/Os net negative 3920, weight not accurate. Now pulling for net negative UF 150 cc/hr consistently via CVVH. SBP ranging 70s-90s but MAP staying 65 and above.   Bipap at night, HFNC during the day.   May have aspirated while drinking water yesterday though denies worsening in breathing.  WBCs higher 19.9, afebrile.   Echo: EF 60-65%, D-shaped septum, moderate RV enlargement/moderate RV dysfunction, PASP 59, mod-severe TR, IVC dilated.    Objective:   Weight Range: 85.7 kg Body mass index is 36.9 kg/m.   Vital Signs:   Temp:  [96.1 F (35.6 C)-97.6 F (36.4 C)] 97.5 F (36.4 C) (08/06 0809) Pulse Rate:  [43-92] 86 (08/06 0810) Resp:  [13-31] 22 (08/06 0810) BP: (65-151)/(48-126) 74/58 (08/06 0800) SpO2:  [92 %-100 %] 94 % (08/06 0810) FiO2 (%):  [50 %-80 %] 80 % (08/06 0810) Weight:  [85.7 kg] 85.7 kg (08/06 0500) Last BM Date : 11/26/21  Weight change: Filed Weights   11/27/21 0500 11/28/21 0400 11/29/21 0500  Weight: 89.1 kg 83.7 kg 85.7 kg    Intake/Output:   Intake/Output Summary (Last 24 hours) at 11/29/2021 0832 Last data filed at 11/29/2021 0800 Gross per 24 hour  Intake 3029 ml  Output 6803 ml  Net -3774 ml      Physical Exam  CVP 26 General: NAD Neck: JVP 16 cm, no thyromegaly or thyroid nodule.  Lungs: Clear to auscultation bilaterally with normal respiratory effort. CV: Nondisplaced PMI.  Heart regular S1/S2, no S3/S4, no murmur.  1+ edema to thighs Abdomen: Soft, nontender, no hepatosplenomegaly,  no distention.  Skin: Intact without lesions or rashes.  Neurologic: Alert and oriented x 3.  Psych: Normal affect. Extremities: No clubbing or cyanosis.  HEENT: Normal.    Telemetry   SR 80s, personally reviewed.   Labs    CBC Recent Labs    11/28/21 0447 11/29/21 0351  WBC 10.0 19.9*  HGB 9.2* 8.8*  HCT 26.5* 26.2*  MCV 82.0 84.0  PLT 115* 98*   Basic Metabolic Panel Recent Labs    11/28/21 0447 11/28/21 2039 11/29/21 0351  NA 130* 130* 130*  K 4.3 4.1 4.2  CL 98 98 97*  CO2 _0 GLUCOSE 142* 110* 166*  BUN 6 6 5*  CREATININE 0.86 0.71 0.70  CALCIUM 7.9* 8.0* 8.1*  MG 2.3  --  2.2  PHOS 1.4* 2.9  2.8 2.2*   Liver Function Tests Recent Labs    11/28/21 2039 11/29/21 0351  ALBUMIN 3.2* 3.0*   No results for input(s): "LIPASE", "AMYLASE" in the last 72 hours. Cardiac Enzymes No results for input(s): "CKTOTAL", "CKMB", "CKMBINDEX", "TROPONINI" in the last 72 hours.   BNP: BNP (last 3 results) Recent Labs    09/10/21 1614 11/11/21 1620 11/07/2021 1510  BNP 916.7* 1,063.1* 1,014.0*    ProBNP (last 3 results) No results for input(s): "PROBNP" in the last 8760 hours.   D-Dimer No results for input(s): "DDIMER" in the  last 72 hours. Hemoglobin A1C No results for input(s): "HGBA1C" in the last 72 hours.  Fasting Lipid Panel No results for input(s): "CHOL", "HDL", "LDLCALC", "TRIG", "CHOLHDL", "LDLDIRECT" in the last 72 hours. Thyroid Function Tests No results for input(s): "TSH", "T4TOTAL", "T3FREE", "THYROIDAB" in the last 72 hours.  Invalid input(s): "FREET3"  Other results:   Imaging    No results found.   Medications:     Scheduled Medications:  (feeding supplement) PROSource Plus  30 mL Oral BID BM   Chlorhexidine Gluconate Cloth  6 each Topical Daily   famotidine  20 mg Oral Daily   feeding supplement (NEPRO CARB STEADY)  237 mL Oral Q2000   Gerhardt's butt cream   Topical TID   heparin  5,000 Units Subcutaneous Q8H    insulin aspart  0-6 Units Subcutaneous Q4H   macitentan  10 mg Oral Daily   midodrine  15 mg Oral TID WC   multivitamin  1 tablet Oral QHS   mouth rinse  15 mL Mouth Rinse 4 times per day   polyethylene glycol  17 g Oral Daily   senna-docusate  1 tablet Oral BID   sildenafil  20 mg Oral TID    Infusions:   prismasol BGK 4/2.5 500 mL/hr at 11/29/21 0624    prismasol BGK 4/2.5 300 mL/hr at 11/29/21 0448   sodium chloride Stopped (11/28/21 0758)   DOBUTamine 5 mcg/kg/min (11/29/21 0800)   heparin 10,000 units/ 20 mL infusion syringe 500 Units/hr (11/29/21 0800)   norepinephrine (LEVOPHED) Adult infusion 14 mcg/min (11/29/21 0800)   prismasol BGK 4/2.5 1,500 mL/hr at 11/29/21 0800    PRN Medications: Place/Maintain arterial line **AND** sodium chloride, bisacodyl, docusate, heparin, ondansetron (ZOFRAN) IV, mouth rinse, polyethylene glycol, simethicone, sodium chloride, white petrolatum    Assessment/Plan   1. Pulmonary hypertension/RV failure: Echo in 11/22 showed EF 60-65%, mild LVH, severe RV enlargement with severely decreased RV systolic function, PASP 66 mmHg, severe RAE, moderate TR, moderate PR, small pericardial effusion. CTA chest showed no PE, normal lung fields and V/Q scan showed no evidence for chronic PE.  PFTs showed severe restriction and decreased DLCO.  RHC/LHC showed severe pulmonary arterial HTN with RV failure.  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  No history of liver disease.  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  ANA, SCL-70, anti-centromere antibody, and RF negative with elevated CRP. She may have connective tissue disease-related PAH though she saw rheumatology and no definite diagnosis was given.  High resolution CT chest showed no evidence for ILD.  She was admitted this time with worsening RV failure and AKI. Echo this admission with EF 60-65%, D-shaped  septum, moderate RV enlargement/moderate RV dysfunction, PASP 59, mod-severe TR, IVC dilated. CVP is still 29 this morning, marked volume overload on exam.  She was started on dobutamine 2.5 for RV support, ultimately further titrated to 5 after Co-ox dropped to 40%. Oliguric despite high dose lasix gtt at 30/hr. Started on CRRT 7/31 for intractable volume overload. This morning on DBA 5 + 14 NE. Co-ox 61%.  Good I/Os yesterday, pulling UF net negative 150 cc/hr consistently via CVVH. CVP lower today at 26.  - Continue DBA + NE.  - Continue to decongest RV with markedly elevated CVP, tolerating CVVH net UF 150 cc/hr.  - Continue midodrine 15 tid.  - Continue Opsumit 10 mg daily.  - Transitioned tadalafil  to sildenafil with AKI, on 20 mg tid.  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 She does not report myalgias or muscle pain.  Myositis panel was negative.  She saw rheumatology and no definite diagnosis was given. CK normal at 62 this admission.  3. Acute on chronic hypoxemic respiratory failure: On 2L home oxygen at baseline, currently on HFNC with Bipap at night.  Off antibiotics.  4. AKI on CKD stage 3: Creatinine had been slowly rising at home with diuresis, suspect cardiorenal syndrome.  However, up to 3.3 at admission with marked hyperkalemia. No arrhythmias. K now normal. Anuric.  - Continue trial of CVVH to see if we can decongest her then titrate off the CVVH. Would likely tolerate iHD poorly with severe RV failure. - Nephrology following. Appreciate their assistance  5. ID: CT chest with LLL infiltrate => completed Ceftriaxone/azithromycin. WBCs up to 19.9 but afebrile, nurse concerned for possible aspiration yesterday.  - CXR today.  6. Difficult foley: Foley now in place via urology, no UOP but still flushing because of bleeding from  traumatic placement attempts.  - When bleeding has stopped, should be able to remove.    CRITICAL CARE Performed by: Loralie Champagne  Total critical care time: 35 minutes  Critical care time was exclusive of separately billable procedures and treating other patients.  Critical care was necessary to treat or prevent imminent or life-threatening deterioration.  Critical care was time spent personally by me on the following activities: development of treatment plan with patient and/or surrogate as well as nursing, discussions with consultants, evaluation of patient's response to treatment, examination of patient, obtaining history from patient or surrogate, ordering and performing treatments and interventions, ordering and review of laboratory studies, ordering and review of radiographic studies, pulse oximetry and re-evaluation of patient's condition.   Length of Stay: 9  Loralie Champagne, MD  11/29/2021, 8:32 AM  Advanced Heart Failure Team Pager [phone] (M-F; 7a - 5p)  Please contact [facility] for night-coverage after hours (5p -7a ) and weekends on [email or web address]

## 2021-11-30 DIAGNOSIS — I509 Heart failure, unspecified: Secondary | ICD-10-CM | POA: Diagnosis not present

## 2021-11-30 DIAGNOSIS — I50810 Right heart failure, unspecified: Secondary | ICD-10-CM | POA: Diagnosis not present

## 2021-11-30 DIAGNOSIS — Z7189 Other specified counseling: Secondary | ICD-10-CM | POA: Diagnosis not present

## 2021-11-30 DIAGNOSIS — J9601 Acute respiratory failure with hypoxia: Secondary | ICD-10-CM | POA: Diagnosis not present

## 2021-11-30 LAB — GLUCOSE, CAPILLARY
Glucose-Capillary: 104 mg/dL — ABNORMAL HIGH (ref 70–99)
Glucose-Capillary: 107 mg/dL — ABNORMAL HIGH (ref 70–99)
Glucose-Capillary: 125 mg/dL — ABNORMAL HIGH (ref 70–99)
Glucose-Capillary: 138 mg/dL — ABNORMAL HIGH (ref 70–99)
Glucose-Capillary: 142 mg/dL — ABNORMAL HIGH (ref 70–99)
Glucose-Capillary: 152 mg/dL — ABNORMAL HIGH (ref 70–99)
Glucose-Capillary: 186 mg/dL — ABNORMAL HIGH (ref 70–99)

## 2021-11-30 LAB — TECHNOLOGIST SMEAR REVIEW: Plt Morphology: NORMAL

## 2021-11-30 LAB — POCT I-STAT 7, (LYTES, BLD GAS, ICA,H+H)
Acid-base deficit: 4 mmol/L — ABNORMAL HIGH (ref 0.0–2.0)
Acid-base deficit: 4 mmol/L — ABNORMAL HIGH (ref 0.0–2.0)
Bicarbonate: 21.5 mmol/L (ref 20.0–28.0)
Bicarbonate: 22 mmol/L (ref 20.0–28.0)
Calcium, Ion: 1.16 mmol/L (ref 1.15–1.40)
Calcium, Ion: 1.17 mmol/L (ref 1.15–1.40)
HCT: 33 % — ABNORMAL LOW (ref 36.0–46.0)
HCT: 34 % — ABNORMAL LOW (ref 36.0–46.0)
Hemoglobin: 11.2 g/dL — ABNORMAL LOW (ref 12.0–15.0)
Hemoglobin: 11.6 g/dL — ABNORMAL LOW (ref 12.0–15.0)
O2 Saturation: 82 %
O2 Saturation: 88 %
Patient temperature: 97.5
Patient temperature: 97.5
Potassium: 4.3 mmol/L (ref 3.5–5.1)
Potassium: 4.3 mmol/L (ref 3.5–5.1)
Sodium: 132 mmol/L — ABNORMAL LOW (ref 135–145)
Sodium: 133 mmol/L — ABNORMAL LOW (ref 135–145)
TCO2: 23 mmol/L (ref 22–32)
TCO2: 23 mmol/L (ref 22–32)
pCO2 arterial: 40.8 mmHg (ref 32–48)
pCO2 arterial: 43.5 mmHg (ref 32–48)
pH, Arterial: 7.327 — ABNORMAL LOW (ref 7.35–7.45)
pH, Arterial: 7.309 — ABNORMAL LOW (ref 7.35–7.45)
pO2, Arterial: 48 mmHg — ABNORMAL LOW (ref 83–108)
pO2, Arterial: 59 mmHg — ABNORMAL LOW (ref 83–108)

## 2021-11-30 LAB — CBC WITH DIFFERENTIAL/PLATELET
Abs Immature Granulocytes: 0.2 10*3/uL — ABNORMAL HIGH (ref 0.00–0.07)
Basophils Absolute: 0 10*3/uL (ref 0.0–0.1)
Basophils Relative: 0 %
Eosinophils Absolute: 0.3 10*3/uL (ref 0.0–0.5)
Eosinophils Relative: 3 %
HCT: 25.7 % — ABNORMAL LOW (ref 36.0–46.0)
Hemoglobin: 8.8 g/dL — ABNORMAL LOW (ref 12.0–15.0)
Immature Granulocytes: 2 %
Lymphocytes Relative: 8 %
Lymphs Abs: 0.9 10*3/uL (ref 0.7–4.0)
MCH: 28.1 pg (ref 26.0–34.0)
MCHC: 34.2 g/dL (ref 30.0–36.0)
MCV: 82.1 fL (ref 80.0–100.0)
Monocytes Absolute: 2.1 10*3/uL — ABNORMAL HIGH (ref 0.1–1.0)
Monocytes Relative: 18 %
Neutro Abs: 8.4 10*3/uL — ABNORMAL HIGH (ref 1.7–7.7)
Neutrophils Relative %: 69 %
Platelets: 71 10*3/uL — ABNORMAL LOW (ref 150–400)
RBC: 3.13 MIL/uL — ABNORMAL LOW (ref 3.87–5.11)
RDW: 19.5 % — ABNORMAL HIGH (ref 11.5–15.5)
WBC: 11.9 10*3/uL — ABNORMAL HIGH (ref 4.0–10.5)
nRBC: 7.5 % — ABNORMAL HIGH (ref 0.0–0.2)

## 2021-11-30 LAB — RENAL FUNCTION PANEL
Albumin: 3.1 g/dL — ABNORMAL LOW (ref 3.5–5.0)
Albumin: 3.2 g/dL — ABNORMAL LOW (ref 3.5–5.0)
Anion gap: 7 (ref 5–15)
Anion gap: 10 (ref 5–15)
BUN: 7 mg/dL (ref 6–20)
BUN: 7 mg/dL (ref 6–20)
CO2: 26 mmol/L (ref 22–32)
CO2: 22 mmol/L (ref 22–32)
Calcium: 8.6 mg/dL — ABNORMAL LOW (ref 8.9–10.3)
Calcium: 8.5 mg/dL — ABNORMAL LOW (ref 8.9–10.3)
Chloride: 99 mmol/L (ref 98–111)
Chloride: 99 mmol/L (ref 98–111)
Creatinine, Ser: 0.82 mg/dL (ref 0.44–1.00)
Creatinine, Ser: 0.78 mg/dL (ref 0.44–1.00)
GFR, Estimated: >60 mL/min (ref >60)
GFR, Estimated: >60 mL/min (ref >60)
Glucose, Bld: 139 mg/dL — ABNORMAL HIGH (ref 70–99)
Glucose, Bld: 121 mg/dL — ABNORMAL HIGH (ref 70–99)
Phosphorus: 3.4 mg/dL (ref 2.5–4.6)
Phosphorus: 2.3 mg/dL — ABNORMAL LOW (ref 2.5–4.6)
Potassium: 4 mmol/L (ref 3.5–5.1)
Potassium: 4.3 mmol/L (ref 3.5–5.1)
Sodium: 132 mmol/L — ABNORMAL LOW (ref 135–145)
Sodium: 131 mmol/L — ABNORMAL LOW (ref 135–145)

## 2021-11-30 LAB — HEPATIC FUNCTION PANEL
ALT: 22 U/L (ref 0–44)
AST: 27 U/L (ref 15–41)
Albumin: 3.1 g/dL — ABNORMAL LOW (ref 3.5–5.0)
Alkaline Phosphatase: 118 U/L (ref 38–126)
Bilirubin, Direct: 1.5 mg/dL — ABNORMAL HIGH (ref 0.0–0.2)
Indirect Bilirubin: 1.3 mg/dL — ABNORMAL HIGH (ref 0.3–0.9)
Total Bilirubin: 2.8 mg/dL — ABNORMAL HIGH (ref 0.3–1.2)
Total Protein: 7.6 g/dL (ref 6.5–8.1)

## 2021-11-30 LAB — CBC
HCT: 25.7 % — ABNORMAL LOW (ref 36.0–46.0)
Hemoglobin: 8.9 g/dL — ABNORMAL LOW (ref 12.0–15.0)
MCH: 28.3 pg (ref 26.0–34.0)
MCHC: 34.6 g/dL (ref 30.0–36.0)
MCV: 81.6 fL (ref 80.0–100.0)
Platelets: 68 10*3/uL — ABNORMAL LOW (ref 150–400)
RBC: 3.15 MIL/uL — ABNORMAL LOW (ref 3.87–5.11)
RDW: 19.4 % — ABNORMAL HIGH (ref 11.5–15.5)
WBC: 12.1 10*3/uL — ABNORMAL HIGH (ref 4.0–10.5)
nRBC: 7.5 % — ABNORMAL HIGH (ref 0.0–0.2)

## 2021-11-30 LAB — APTT
aPTT: 120 seconds — ABNORMAL HIGH (ref 24–36)
aPTT: 124 seconds — ABNORMAL HIGH (ref 24–36)
aPTT: 110 seconds — ABNORMAL HIGH (ref 24–36)

## 2021-11-30 LAB — COOXEMETRY PANEL
Carboxyhemoglobin: 1.3 % (ref 0.5–1.5)
Methemoglobin: <0.7 % (ref 0.0–1.5)
O2 Saturation: 61.5 %
Total hemoglobin: 9.4 g/dL — ABNORMAL LOW (ref 12.0–16.0)

## 2021-11-30 LAB — DIC (DISSEMINATED INTRAVASCULAR COAGULATION)PANEL
D-Dimer, Quant: 2.72 ug/mL-FEU — ABNORMAL HIGH (ref 0.00–0.50)
Fibrinogen: 325 mg/dL (ref 210–475)
INR: 1.2 (ref 0.8–1.2)
Platelets: 70 10*3/uL — ABNORMAL LOW (ref 150–400)
Prothrombin Time: 15.1 seconds (ref 11.4–15.2)
Smear Review: NONE SEEN
aPTT: 120 seconds — ABNORMAL HIGH (ref 24–36)

## 2021-11-30 LAB — MAGNESIUM: Magnesium: 2.4 mg/dL (ref 1.7–2.4)

## 2021-11-30 LAB — PROCALCITONIN: Procalcitonin: 1.22 ng/mL

## 2021-11-30 LAB — MRSA NEXT GEN BY PCR, NASAL: MRSA by PCR Next Gen: NOT DETECTED

## 2021-11-30 MED ORDER — SODIUM CHLORIDE 0.9 % IV SOLN
2.0000 g | Freq: Two times a day (BID) | INTRAVENOUS | Status: DC
  Administered 2021-11-30 – 2021-12-01 (×3): 2 g via INTRAVENOUS
  Filled 2021-11-30 (×3): qty 12.5

## 2021-11-30 MED ORDER — SODIUM CHLORIDE 0.9 % IV SOLN: 250.0000 mL | INTRAVENOUS | Status: DC | PRN

## 2021-11-30 MED ORDER — SODIUM CHLORIDE 0.9 % IV SOLN
0.0400 mg/kg/h | INTRAVENOUS | Status: DC
  Administered 2021-11-30: 0.05 mg/kg/h via INTRAVENOUS
  Filled 2021-11-30: qty 250

## 2021-11-30 MED ORDER — SODIUM CHLORIDE 0.9 % IV SOLN
0.0300 mg/kg/h | INTRAVENOUS | Status: DC
  Administered 2021-11-30: 0.03 mg/kg/h via INTRAVENOUS
  Filled 2021-11-30: qty 250

## 2021-11-30 MED ORDER — VANCOMYCIN HCL IN DEXTROSE 1-5 GM/200ML-% IV SOLN
1000.0000 mg | INTRAVENOUS | Status: DC
Start: 2021-12-01 — End: 2021-12-02

## 2021-11-30 MED ORDER — VANCOMYCIN HCL 1750 MG/350ML IV SOLN
1750.0000 mg | Freq: Once | INTRAVENOUS | Status: AC
  Administered 2021-11-30: 1750 mg via INTRAVENOUS
  Filled 2021-11-30: qty 350

## 2021-11-30 MED ORDER — SODIUM CHLORIDE 0.9 % IV SOLN: INTRAVENOUS | Status: DC | PRN

## 2021-11-30 MED ORDER — SODIUM CHLORIDE 0.9% FLUSH: 3.0000 mL | INTRAVENOUS | Status: DC | PRN

## 2021-11-30 MED ORDER — SODIUM CHLORIDE 0.9% FLUSH
3.0000 mL | Freq: Two times a day (BID) | INTRAVENOUS | Status: DC
  Administered 2021-11-30: 3 mL via INTRAVENOUS

## 2021-11-30 NOTE — Progress Notes (Signed)
NAME:  Shannon [name], MRN:  [id number], DOB:  1965-09-16, LOS: 59 ADMISSION DATE:  10/29/2021, CONSULTATION DATE:  7/28 REFERRING MD:  Dr. Regenia Skeeter, CHIEF COMPLAINT:  chf exacerbation   History of Present Illness:  Patient is a 56 year old female with pertinent PMH HTN, DMT2, severe pulmonary HTN followed by [name] presents to [facility] ED on 7/28 with SOB.  Patient has been experiencing weakness for the past several days and having trouble walking without getting SOB.  Today on 7/28 patient was more confused per family member.  Also was having trouble breathing.  Patient wears 2L [place] at home.  EMS called and transported patient to [facility] ED.  Patient sats low in the 80s.  Upon arrival to [facility] ED on 7/28, patient sats improved on 6L [place].  Hemodynamically stable.  Patient Aox4. CXR showing LLL atelectasis. CT chest showing LLL consolidation. Azithromycin/rocephin given. Troponin wnl. BNP 1,014. ABG initially 7.34, 45, 63, 25. Covid/flu negative. Na 132, K 7.5, creat 3.3, bun 51, ammonia 43. Patient began to have worsening resp distress and was placed on bipap. Given calcium, insulin/dextrose, albuterol, lasix. Patient mental status worsening on bipap. Repeat abg 7.25, 58, 71, 25. Patient arousable but remains lethargic. PCCM consulted.  Pertinent  Medical History   Past Medical History:  Diagnosis Date   CHF (congestive heart failure) ([place])    Diabetes ([place])    Hypertension    Inflammatory myopathy    treated 2012   Pulmonary hypertension ([place])    Wears glasses    reading    Significant Hospital Events: Including procedures, antibiotic start and stop dates in addition to other pertinent events   7/28 admitted [facility] resp failure on bipap  Interim History / Subjective:  Remains weak. Pressor requirements are up. Denies pain or breathlessness. CVP remains up.  Objective   Blood pressure (!) 88/62, pulse 85, temperature (!) 97.5 F (36.4 C), temperature source Oral, resp. rate (!) 21, height  5' (1.524 m), weight 82.8 kg, SpO2 95 %. CVP:  [21 mmHg-57 mmHg] 29 mmHg  FiO2 (%):  [85 %] 85 %   Intake/Output Summary (Last 24 hours) at 11/30/2021 0819 Last data filed at 11/30/2021 0800 Gross per 24 hour  Intake 2977.97 ml  Output 5486 ml  Net -2508.03 ml    Filed Weights   11/28/21 0400 11/29/21 0500 11/30/21 0500  Weight: 83.7 kg 85.7 kg 82.8 kg    Examination: Chronically ill under bair hugger Ext with minimal edema +murmur, ext warm under warming blanket HD catheter CDI Moves all 4 ext to command Globally weak AOx3  Coox 61% on dobut 5, levo 30, midodrine 15 tid Plts down ?why  Resolved Hospital Problem list     Assessment & Plan:  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, cardiogenic shock LLL CAP s/p abx improved Hx inflammatory myopathy- no e/o flare, CK WNL Hx of Groups 1/3 Pulm HTN- longstanding, baseline O2 dependence Worsening thrombocytopenia unclear cause- on bival Hx HTN< DM2  - Check LFTs (sequestration?), DIC panel, smear - Levophed to MAP 65, continue to try to push fluid removal - Targeted vasodilators, midodrine, and dobutamine per CHF team - BIPAP qHS, [place] during day - f/u CXR - Agree with palliative consult - Will follow  Best Practice (right click and "Reselect all SmartList Selections" daily)   Diet/type: Regular consistency DVT prophylaxis: bival GI prophylaxis: N/A Lines: N/A Foley:  N/A Code Status:  full code Last date of multidisciplinary goals of care discussion [7/31 spoke with  sister and updated at bedside; would like [place] to remain full code.]   Patient critically ill due to shock Interventions to address this today levophed titration Risk of deterioration without these interventions is high  I personally spent 32 minutes providing critical care not including any separately billable procedures  Erskine Emery MD [facility]  Prefer epic messenger for cross cover needs If after hours, please call  E-link

## 2021-11-30 NOTE — Progress Notes (Signed)
Occupational Therapy Treatment Patient Details Name: Shannon [name] MRN: [id number] DOB: 19-Dec-1965 Today's Date: 11/30/2021   History of present illness 56 y.o. female admitted 10/25/2021 with SOB, progressive weakness, AMS. CXR with LLL atelectasis and consolidation. Workup for acute on chronic hypoxemic/hypercarbic respiratory failure due to decompensated R HF and possible LLL CAP. Pt with AKI; CRRT initiated 7/31. PMH includes CHF, DM, HTN, pulmonary HTN.   OT comments  Pt progressing towards established OT goals. Pt performing mobility in room to recliner with Min A. Pt performing oral care while seated with supervision. Pt completing peri care with Min A and RW. VSS. Rn present. Mobility distance limited by CRRT and HHFNC. Continue to recommend dc to home with HHOT. Will continue to follow acutely as admitted.   Recommendations for follow up therapy are one component of a multi-disciplinary discharge planning process, led by the attending physician.  Recommendations may be updated based on patient status, additional functional criteria and insurance authorization.    Follow Up Recommendations  Home health OT    Assistance Recommended at Discharge Intermittent Supervision/Assistance  Patient can return home with the following  A little help with walking and/or transfers;A little help with bathing/dressing/bathroom;Assist for transportation;Assistance with cooking/housework   Equipment Recommendations  BSC/3in1    Recommendations for Other Services      Precautions / Restrictions Precautions Precautions: Fall;Other (comment) Precaution Comments: CRRT, heated HFNC (25L O2 at 80% FiO2) Restrictions Weight Bearing Restrictions: No       Mobility Bed Mobility Overal bed mobility: Needs Assistance Bed Mobility: Sit to Supine     Supine to sit: Mod assist, HOB elevated     General bed mobility comments: Pt bringing BLEs towards EOB with tactile cues for initation. Mod A to  elevate trunk. Pt then scooting hips towards EOB    Transfers Overall transfer level: Needs assistance Equipment used: Rolling walker (2 wheels) Transfers: Sit to/from Stand Sit to Stand: Min assist           General transfer comment: Min A for power up. CUes for hand placement     Balance Overall balance assessment: Needs assistance Sitting-balance support: No upper extremity supported, Feet supported Sitting balance-Leahy Scale: Fair     Standing balance support: No upper extremity supported, Bilateral upper extremity supported Standing balance-Leahy Scale: Fair                             ADL either performed or assessed with clinical judgement   ADL Overall ADL's : Needs assistance/impaired     Grooming: Sitting;Oral care;Supervision/safety;Set up               Lower Body Dressing: Maximal assistance;Bed level Lower Body Dressing Details (indicate cue type and reason): Attempting figure four in bed, but unable to achieve position Toilet Transfer: Minimal assistance;Ambulation;Rolling walker (2 wheels) (simulated to recliner)   Toileting- Clothing Manipulation and Hygiene: Minimal assistance;Sit to/from stand Toileting - Clothing Manipulation Details (indicate cue type and reason): MIn A for managing gown  and balance     Functional mobility during ADLs: Minimal assistance;Rolling walker (2 wheels) General ADL Comments: Pt performing mobility to recliner (limited by CRRT and HHFNC). THen performing grooming and toilet hygiene.    Extremity/Trunk Assessment Upper Extremity Assessment Upper Extremity Assessment: Generalized weakness   Lower Extremity Assessment Lower Extremity Assessment: Defer to PT evaluation        Vision       Perception Perception  Perception: Within Functional Limits   Praxis Praxis Praxis: Intact    Cognition Arousal/Alertness: Awake/alert Behavior During Therapy: WFL for tasks assessed/performed, Flat  affect Overall Cognitive Status: Within Functional Limits for tasks assessed                                 General Comments: Agreeable to therapy and following commands        Exercises      Shoulder Instructions       General Comments Vitals stable on HHFNC. No dizziness. RN present throughout    Pertinent Vitals/ Pain       Pain Assessment Pain Assessment: Faces Faces Pain Scale: Hurts a little bit Pain Location: buttocks from sitting in recliner Pain Descriptors / Indicators: Sore Pain Intervention(s): Monitored during session, Limited activity within patient's tolerance, Repositioned  Home Living                                          Prior Functioning/Environment              Frequency  Min 2X/week        Progress Toward Goals  OT Goals(current goals can now be found in the care plan section)  Progress towards OT goals: Progressing toward goals  Acute Rehab OT Goals OT Goal Formulation: With patient Time For Goal Achievement: 12/10/21 Potential to Achieve Goals: Good ADL Goals Pt Will Perform Grooming: with supervision;standing Pt Will Perform Upper Body Dressing: sitting;with set-up Pt Will Perform Lower Body Dressing: with supervision;sit to/from stand Pt Will Transfer to Toilet: with supervision;ambulating;regular height toilet Pt/caregiver will Perform Home Exercise Program: Increased strength;Both right and left upper extremity;With Supervision  Plan Discharge plan remains appropriate    Co-evaluation                 AM-PAC OT "6 Clicks" Daily Activity     Outcome Measure   Help from another person eating meals?: A Little Help from another person taking care of personal grooming?: A Little Help from another person toileting, which includes using toliet, bedpan, or urinal?: A Little Help from another person bathing (including washing, rinsing, drying)?: A Lot Help from another person to put on and  taking off regular upper body clothing?: A Little Help from another person to put on and taking off regular lower body clothing?: A Lot 6 Click Score: 16    End of Session Equipment Utilized During Treatment: Oxygen;Rolling walker (2 wheels)  OT Visit Diagnosis: Unsteadiness on feet (R26.81);Muscle weakness (generalized) (M62.81)   Activity Tolerance Patient tolerated treatment well   Patient Left in chair;with call bell/phone within reach;with family/visitor present   Nurse Communication Mobility status        Time: 9449-6759 OT Time Calculation (min): 27 min  Charges: OT General Charges $OT Visit: 1 Visit OT Treatments $Self Care/Home Management : 23-37 mins  Colleen Kotlarz MSOT, OTR/L Acute Rehab Office: [place] 11/30/2021, 11:08 AM

## 2021-11-30 NOTE — Procedures (Signed)
Arterial Catheter Insertion Procedure Note  Shannon [name]  [id number]  March 21, 1966  Date:11/30/21  Time:11:37 AM    Provider Performing: Candee Furbish    Procedure: Insertion of Arterial Line [phone]) with US guidance ([id number])   Indication(s) Blood pressure monitoring and/or need for frequent ABGs  Consent Risks of the procedure as well as the alternatives and risks of each were explained to the patient and/or caregiver.  Consent for the procedure was obtained and is signed in the bedside chart  Anesthesia None   Time Out Verified patient identification, verified procedure, site/side was marked, verified correct patient position, special equipment/implants available, medications/allergies/relevant history reviewed, required imaging and test results available.   Sterile Technique Maximal sterile technique including full sterile barrier drape, hand hygiene, sterile gown, sterile gloves, mask, hair covering, sterile ultrasound probe cover (if used).   Procedure Description Area of catheter insertion was cleaned with chlorhexidine and draped in sterile fashion. With real-time ultrasound guidance an arterial catheter was placed into the right radial artery.  Appropriate arterial tracings confirmed on monitor.     Complications/Tolerance None; patient tolerated the procedure well.   EBL Minimal   Specimen(s) None

## 2021-11-30 NOTE — Progress Notes (Signed)
Patient ID: Shannon [name], female   DOB: 02/07/1966, 56 y.o.   MRN: [id number] S: Seen and examined while on CVVHD.  Orders and labs reviewed and adjustments made accordingly.  No events overnight. O:BP (!) 81/57   Pulse 92   Temp (!) 97.5 F (36.4 C) (Oral)   Resp (!) 24   Ht 5' (1.524 m)   Wt 82.8 kg   SpO2 93%   BMI 35.65 kg/m   Intake/Output Summary (Last 24 hours) at 11/30/2021 1057 Last data filed at 11/30/2021 1000 Gross per 24 hour  Intake 3159.66 ml  Output 6243 ml  Net -3083.34 ml   Intake/Output: I/O last 3 completed shifts: In: 4640.6 [P.O.:2834; I.V.:1541.6; IV Piggyback:265] Out: 4481 [Urine:35]  Intake/Output this shift:  Total I/O In: 340.8 [P.O.:236; I.V.:104.8] Out: 1072 [Urine:10] Weight change: -2.9 kg Gen:  critically ill-appearing CVS:RRR Resp: CTA Abd: +BS, soft, NT/ND Ext:1+ edema bilateral lower extremities  Recent Labs  Lab 11/27/21 0411 11/27/21 1712 11/28/21 0447 11/28/21 2039 11/29/21 0351 11/29/21 1533 11/30/21 0412 11/30/21 0838  NA 131* 129* 130* 130* 130* 132* 132*  --   K 4.0 4.3 4.3 4.1 4.2 4.4 4.0  --   CL 97* 96* 98 98 97* 99 99  --   CO2 _0 --   GLUCOSE 119* 146* 142* 110* 166* 128* 139*  --   BUN _1 5* 6 7  --   CREATININE 0.82 0.86 0.86 0.71 0.70 0.72 0.82  --   ALBUMIN 3.0* 3.2* 2.9* 3.2* 3.0* 3.2* 3.1* 3.1*  CALCIUM 8.4* 8.4* 7.9* 8.0* 8.1* 8.6* 8.6*  --   PHOS 2.4* 2.1* 1.4* 2.9  2.8 2.2* 1.8* 3.4  --   AST  --   --   --   --   --   --   --  27  ALT  --   --   --   --   --   --   --  22   Liver Function Tests: Recent Labs  Lab 11/29/21 1533 11/30/21 0412 11/30/21 0838  AST  --   --  27  ALT  --   --  22  ALKPHOS  --   --  118  BILITOT  --   --  2.8*  PROT  --   --  7.6  ALBUMIN 3.2* 3.1* 3.1*   No results for input(s): "LIPASE", "AMYLASE" in the last 168 hours. No results for input(s): "AMMONIA" in the last 168 hours. CBC: Recent Labs  Lab 11/26/21 0400 11/27/21 0411  11/28/21 0447 11/29/21 0351 11/30/21 0412 11/30/21 0838  WBC 7.0 7.7 10.0 19.9* 11.9*  12.1*  --   NEUTROABS  --   --   --   --  8.4*  --   HGB 9.8* 9.9* 9.2* 8.8* 8.8*  8.9*  --   HCT 28.6* 28.5* 26.5* 26.2* 25.7*  25.7*  --   MCV 83.4 81.4 82.0 84.0 82.1  81.6  --   PLT 123* 118* 115* 98* 71*  68* 70*   Cardiac Enzymes: No results for input(s): "CKTOTAL", "CKMB", "CKMBINDEX", "TROPONINI" in the last 168 hours. CBG: Recent Labs  Lab 11/29/21 1550 11/29/21 2002 11/29/21 2351 11/30/21 0414 11/30/21 0755  GLUCAP 116* 138* 143* 138* 125*    Iron Studies: No results for input(s): "IRON", "TIBC", "TRANSFERRIN", "FERRITIN" in the last 72 hours. Studies/Results: DG CHEST PORT 1 VIEW  Result Date: 11/29/2021 CLINICAL DATA:  Respiratory distress.  Pneumonia. EXAM: PORTABLE CHEST 1 VIEW COMPARISON:  Radiographs 11/26/2021 and 11/23/2021.  CT 10/24/2021. FINDINGS: 1028 hours. Enteric tube has been removed. Left subclavian central venous catheter projects to the level of the mid right atrium, unchanged. Interval exchange of the right IJ central venous catheter, projecting to the level of the superior cavoatrial junction. The heart size and mediastinal contours are stable. There are lower lung volumes with increasing bibasilar pulmonary opacities and poor definition of the pulmonary vasculature. There are possible small bilateral pleural effusions. No evidence of pneumothorax. No acute osseous findings are evident. Telemetry leads overlie the chest. IMPRESSION: 1. Increasing bibasilar pulmonary opacities with probable pulmonary edema and small pleural effusions. 2. Support system positioned as above.  No pneumothorax. Electronically Signed   By: Richardean Sale M.D.   On: 11/29/2021 12:04    (feeding supplement) PROSource Plus  30 mL Oral BID BM   Chlorhexidine Gluconate Cloth  6 each Topical Daily   famotidine  20 mg Oral Daily   feeding supplement (NEPRO CARB STEADY)  237 mL Oral Q2000    Gerhardt's butt cream   Topical TID   insulin aspart  0-6 Units Subcutaneous Q4H   macitentan  10 mg Oral Daily   midodrine  15 mg Oral TID WC   multivitamin  1 tablet Oral QHS   mouth rinse  15 mL Mouth Rinse 4 times per day   polyethylene glycol  17 g Oral Daily   senna-docusate  1 tablet Oral BID   sodium chloride flush  3 mL Intravenous Q12H    BMET    Component Value Date/Time   NA 132 (L) 11/30/2021 0412   NA 145 (H) 03/05/2021 1529   K 4.0 11/30/2021 0412   CL 99 11/30/2021 0412   CO2 26 11/30/2021 0412   GLUCOSE 139 (H) 11/30/2021 0412   BUN 7 11/30/2021 0412   BUN 19 03/05/2021 1529   CREATININE 0.82 11/30/2021 0412   CREATININE 0.96 06/17/2021 1055   CALCIUM 8.6 (L) 11/30/2021 0412   GFRNONAA >60 11/30/2021 0412   GFRAA 116 06/03/2020 1606   CBC    Component Value Date/Time   WBC 12.1 (H) 11/30/2021 0412   WBC 11.9 (H) 11/30/2021 0412   RBC 3.15 (L) 11/30/2021 0412   RBC 3.13 (L) 11/30/2021 0412   HGB 8.9 (L) 11/30/2021 0412   HGB 8.8 (L) 11/30/2021 0412   HGB 14.2 03/05/2021 1529   HCT 25.7 (L) 11/30/2021 0412   HCT 25.7 (L) 11/30/2021 0412   HCT 42.5 03/05/2021 1529   PLT 70 (L) 11/30/2021 0838   PLT 408 03/05/2021 1529   MCV 81.6 11/30/2021 0412   MCV 82.1 11/30/2021 0412   MCV 90 03/05/2021 1529   MCH 28.3 11/30/2021 0412   MCH 28.1 11/30/2021 0412   MCHC 34.6 11/30/2021 0412   MCHC 34.2 11/30/2021 0412   RDW 19.4 (H) 11/30/2021 0412   RDW 19.5 (H) 11/30/2021 0412   RDW 14.8 03/05/2021 1529   LYMPHSABS 0.9 11/30/2021 0412   LYMPHSABS 5.6 (H) 07/13/2017 1531   MONOABS 2.1 (H) 11/30/2021 0412   EOSABS 0.3 11/30/2021 0412   EOSABS 0.1 07/13/2017 1531   BASOSABS 0.0 11/30/2021 0412   BASOSABS 0.0 07/13/2017 1531     Assessment/Plan:  AKI/CKD stage IIIb - due to cardiorenal syndrome in setting of severe pulmonary HTN and right heart failure.  Started on CRRT 11/23/21 due to hypotension and worsening volume overload.  She remains hypotensive and  on pressors.  Able to UF with CRRT but she is not a candidate for iHD given her severe pulmonary HTN and RV failure.  CRRT prescription:  4K/2.5Ca for all fluids, pre-filter rate 500 mL/hr, post-filter rate 300 mL/hr, dialysate 1,500 mL/hr, no anticoagulation, UF goal 50-150 mL/hr Temp HD catheter placed 11/23/21 and may need to be replaced soon if we will continue with CRRT. 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  CVP remains elevated at 29 despite UF of  2 liters overnight and 20 liters since starting CRRT.  On high dose levophed and dobutamine.  Continue with midodrine 15 mg tid and opsumit 10 mg daily.  Transitioned to sildenafil 20 mg tid per heart failure team. Acute on chronic hypoxemic respiratory failure - continue with supplemental oxygen and BiPap qhs. Anemia of critical illness - transfuse for Hgb <7 Hyperkalemia - resolved with CRRT Hypophosphatemia - recently repleted.  Disposition - poor overall prognosis and recommend palliative care to discuss EOL/goals of care with family.   Donetta Potts, MD [facility]

## 2021-11-30 NOTE — Progress Notes (Addendum)
Physical Therapy Treatment Patient Details Name: Shannon [name] MRN: [id number] DOB: 12-20-1965 Today's Date: 11/30/2021   History of Present Illness 56 y.o. female admitted 11/01/2021 with SOB, progressive weakness, AMS. CXR with LLL atelectasis and consolidation. Workup for acute on chronic hypoxemic/hypercarbic respiratory failure due to decompensated R HF and possible LLL CAP. Pt with AKI; CRRT initiated 7/31. PMH includes CHF, DM, HTN, pulmonary HTN.    PT Comments    Pt pleasant and willing to mobilize. Pt limited by CRRT and HHFNC with pt able to walk within limited distance of lines and limited by fatigue. Pt educated for seated and standing HEP as well as repeated sit to stands to maximize function and activity.   80% FiO2 on 25L HHFNC, inconsistent pleth throughout with SPO2 88-98% HR 89-93 91/50 (a line)    Recommendations for follow up therapy are one component of a multi-disciplinary discharge planning process, led by the attending physician.  Recommendations may be updated based on patient status, additional functional criteria and insurance authorization.  Follow Up Recommendations  Home health PT     Assistance Recommended at Discharge Frequent or constant Supervision/Assistance  Patient can return home with the following A little help with walking and/or transfers;A little help with bathing/dressing/bathroom;Assistance with cooking/housework;Assist for transportation;Help with stairs or ramp for entrance   Equipment Recommendations  Wheelchair (measurements PT);Rolling walker (2 wheels)    Recommendations for Other Services       Precautions / Restrictions Precautions Precautions: Fall;Other (comment) Precaution Comments: CRRT, HHFNC (25L O2 at 80% FiO2) Restrictions Weight Bearing Restrictions: No     Mobility  Bed Mobility               General bed mobility comments: in chair on arrival and end of session    Transfers Overall transfer level: Needs  assistance   Transfers: Sit to/from Stand Sit to Stand: Min assist           General transfer comment: cues for hand placement, assist to power up x 10 trials with reliance on UE and seated rest after 5 reps16    Ambulation/Gait   Gait Distance (Feet): 16 Feet Assistive device: Rolling walker (2 wheels) Gait Pattern/deviations: Step-through pattern, Decreased stride length   Gait velocity interpretation: <1.8 ft/sec, indicate of risk for recurrent falls   General Gait Details: pt walking 2' forward and back at chair with RW, limited by CRRT and HHFNC   Stairs             Wheelchair Mobility    Modified Rankin (Stroke Patients Only)       Balance Overall balance assessment: Needs assistance   Sitting balance-Leahy Scale: Fair Sitting balance - Comments: sitting without UE support   Standing balance support: Bilateral upper extremity supported, Reliant on assistive device for balance Standing balance-Leahy Scale: Poor Standing balance comment: standing with reliance on UE support                            Cognition Arousal/Alertness: Awake/alert Behavior During Therapy: Flat affect Overall Cognitive Status: Within Functional Limits for tasks assessed                                          Exercises General Exercises - Lower Extremity Long Arc Quad: AROM, Both, Seated, 20 reps Hip Flexion/Marching: AROM, Both, Standing, 15  reps    General Comments General comments (skin integrity, edema, etc.): Vitals stable on HHFNC. No dizziness. RN present throughout      Pertinent Vitals/Pain Pain Assessment Pain Assessment: No/denies pain    Home Living                          Prior Function            PT Goals (current goals can now be found in the care plan section) Progress towards PT goals: Progressing toward goals    Frequency    Min 3X/week      PT Plan Current plan remains appropriate     Co-evaluation              AM-PAC PT "6 Clicks" Mobility   Outcome Measure  Help needed turning from your back to your side while in a flat bed without using bedrails?: A Lot Help needed moving from lying on your back to sitting on the side of a flat bed without using bedrails?: A Lot Help needed moving to and from a bed to a chair (including a wheelchair)?: A Little Help needed standing up from a chair using your arms (e.g., wheelchair or bedside chair)?: A Little Help needed to walk in hospital room?: A Lot Help needed climbing 3-5 steps with a railing? : Total 6 Click Score: 13    End of Session Equipment Utilized During Treatment: Oxygen Activity Tolerance: Patient tolerated treatment well Patient left: in chair;with call bell/phone within reach;with family/visitor present Nurse Communication: Mobility status PT Visit Diagnosis: Other abnormalities of gait and mobility (R26.89);Muscle weakness (generalized) (M62.81)     Time: 4473-9584 PT Time Calculation (min) (ACUTE ONLY): 28 min  Charges:  $Therapeutic Exercise: 8-22 mins $Therapeutic Activity: 8-22 mins                     Bayard Males, PT Acute Rehabilitation Services Office: [phone]    [name] Mallori Araque 11/30/2021, 11:53 AM

## 2021-11-30 NOTE — Progress Notes (Signed)
ANTICOAGULATION CONSULT NOTE - Initial Consult  Pharmacy Consult for bivalirudin Indication:  R/O HIT  No Known Allergies  Patient Measurements: Height: 5' (152.4 cm) Weight: 82.8 kg (182 lb 8.7 oz) IBW/kg (Calculated) : 45.5   Vital Signs: Temp: 97.5 F (36.4 C) (08/07 2000) Temp Source: Axillary (08/07 2000) BP: 91/50 (08/07 1149) Pulse Rate: 82 (08/07 2036)  Labs: Recent Labs    11/29/21 0351 11/29/21 1533 11/30/21 0412 11/30/21 0838 11/30/21 1333 11/30/21 1710 11/30/21 1723 11/30/21 1843 11/30/21 1934  HGB 8.8*  --  8.8*  8.9*  --   --   --  11.2* 11.6*  --   HCT 26.2*  --  25.7*  25.7*  --   --   --  33.0* 34.0*  --   PLT 98*  --  71*  68* 70*  --   --   --   --   --   APTT 141*  --  120* 120* 124*  --   --   --  110*  LABPROT  --   --   --  15.1  --   --   --   --   --   INR  --   --   --  1.2  --   --   --   --   --   CREATININE 0.70 0.72 0.82  --   --  0.78  --   --   --      Estimated Creatinine Clearance: 77.5 mL/min (by C-G formula based on SCr of 0.78 mg/dL).   Medical History: Past Medical History:  Diagnosis Date   CHF (congestive heart failure) ([place])    Diabetes ([place])    Hypertension    Inflammatory myopathy    treated 2012   Pulmonary hypertension ([place])    Wears glasses    reading    Medications:  Infusions:    prismasol BGK 4/2.5 500 mL/hr at 11/30/21 0241    prismasol BGK 4/2.5 300 mL/hr at 11/30/21 1528   sodium chloride 10 mL/hr at 11/29/21 1847   sodium chloride     sodium chloride Stopped (11/30/21 1457)   bivalirudin (ANGIOMAX) 250 mg in sodium chloride 0.9 % 500 mL (0.5 mg/mL) infusion     ceFEPime (MAXIPIME) IV Stopped (11/30/21 1735)   DOBUTamine 5 mcg/kg/min (11/30/21 2047)   norepinephrine (LEVOPHED) Adult infusion 36 mcg/min (11/30/21 2000)   prismasol BGK 4/2.5 1,500 mL/hr at 11/30/21 1902   [START ON 12-09-21] vancomycin      Assessment: 56 yo Shannon [name] on SQ heparin for DVT prophylaxis and heparin with CRRT.   Now with significant decrease in platelet count (baseline 249 > 68).  MD ordering HIT assays, and pharmacy asked to start bivalirudin.  Baseline aPTT elevated at 120.  No overt bleeding or complications noted.  APTT came back at 110 this PM. We will hold x 30 minutes then decrease rate. No bleeding issue per Rn.  Goal of Therapy:  aPTT 50-85 seconds Monitor platelets by anticoagulation protocol: Yes   Plan:  Hold bival x 30 minutes Decrease bivalirudin to 0.03 mg/kg/hr. Check aPTT in 4 hrs. F/u HIT antibody and SRA.  Onnie Boer, PharmD, BCIDP, AAHIVP, CPP Infectious Disease Pharmacist 11/30/2021 9:01 PM

## 2021-11-30 NOTE — Progress Notes (Signed)
Pharmacy Antibiotic Note  Shannon [name] is a 56 y.o. female admitted on 11/08/2021 with suspected sepsis.  Platelet count declining, WBC up. Afebrile, but on CRRT.  Pharmacy has been consulted for vancomycin and cefepime dosing.  Plan: Vancomycin 1750 mg x 1, then 1000 mg q 24 hrs while on CRRT Cefepime 2g IV q 12 hrs while on CRRT.  Height: 5' (152.4 cm) Weight: 82.8 kg (182 lb 8.7 oz) IBW/kg (Calculated) : 45.5  Temp (24hrs), Avg:97.4 F (36.3 C), Min:96.5 F (35.8 C), Max:97.6 F (36.4 C)  Recent Labs  Lab 11/25/21 1354 11/25/21 1434 11/25/21 1614 11/26/21 0400 11/26/21 1600 11/27/21 0411 11/27/21 1712 11/28/21 0447 11/28/21 2039 11/29/21 0351 11/29/21 1533 11/30/21 0412  WBC  --    < >  --  7.0  --  7.7  --  10.0  --  19.9*  --  11.9*  12.1*  CREATININE  --    < >  --  1.00   < > 0.82   < > 0.86 0.71 0.70 0.72 0.82  LATICACIDVEN 1.8  --  1.5  --   --   --   --   --   --   --   --   --    < > = values in this interval not displayed.    Estimated Creatinine Clearance: 75.7 mL/min (by C-G formula based on SCr of 0.82 mg/dL).    No Known Allergies  Antimicrobials this admission: Azithromycin 7/28 > 8/2 Ceftriaxone 7/28 > 8/3 Vancomycin 8/7 >  Cefepime 8/7 >   Dose adjustments this admission:  Microbiology results: 7/28 BCx x 2: neg 7/28 UCx: neg  7/29 MRSA PCR neg  Thank you for allowing pharmacy to be a part of this patient's care.   [place] Crane, Roylene Reason, BCCP Clinical Pharmacist  11/30/2021 2:27 PM   [facility] pharmacy phone numbers are listed on [email or web address]

## 2021-11-30 NOTE — Progress Notes (Signed)
RT x 2 attempted to place/maintain A-line but could not achieve. RN and MD aware.

## 2021-11-30 NOTE — Consult Note (Signed)
Consultation Note Date: 11/30/2021   Patient Name: Shannon [name]  DOB: [birth year]  MRN: [id number]  Age / Sex: 56 y.o., female  PCP: Minette Brine, [place] Referring Physician: Larey Dresser, MD  Reason for Consultation:  End stage RV/renal failure   HPI/Patient Profile: 56 y.o. female  with past medical history of pulmonary hypertension, admitted on 11/10/2021 with shortness of breath, confusion. Required bipap in ED for respiratory distress. Workup revealed decompensated R H failure with acute hypoxic and hypercarbic respiratory failure. She was started on dobutamine in ED. She had significant hyperkalemia.  CT of the chest showed left lower lobe consolidation.  Her admission has been complicated by cardiorenal syndrome.  She was started on CRRT 7/31 due to hypotension and worsening volume overload.  She is not a candidate for intermittent hemodialysis.  Per cardiology her prognosis is poor she is requiring higher pressor doses to allow for CRRT and she is not having renal recovery she is not a candidate for long-term HD.  She is currently dependent on heated high flow nasal cannula. Dr. [name] mentioned hospice to her.  Primary Decision Maker PATIENT  Discussion: Chart reviewed including labs, progress notes from this and current admissions as well as care everywhere, imaging. Evaluated patient she was awake and alert.  She is able to participate in goals of care conversation. There were no family members at bedside.  Introduced palliative medicine.  I gently discussed with her the reason for palliative consult. We discussed her support system- her daughter, her best friend Shannon [name], and her mother.  Shannon [name] shares that she feels unclear about her overall prognosis- she notes that this morning Dr. [name] shared his concern about her ability to improve- but she hears mixed messaging. She agrees to palliative GOC  discussion with her family.  I called and spoke with her daughterTyler [name]. Shannon also endorsed hearing mixed messaging from various staff about her mother's illness state.  I discussed with Shannon [name] the concern that her mother is not showing improvements.  Shannon agreed to coordinate with other family members to be present for meeting tomorrow. Shannon also requested straightforward communication with her and her mother.       SUMMARY OF RECOMMENDATIONS -Continue current care -Plan for meeting tomorrow with patient and family at 1pm -Recommend straightforward communication with patient and family regarding her prognosis   Code Status/Advance Care Planning: Full code   Prognosis:   Unable to determine  Discharge Planning: To Be Determined  Primary Diagnoses: Present on Admission:  Acute respiratory failure with hypoxia [facility])     Physical Exam Vitals reviewed.  Pulmonary:     Effort: Pulmonary effort is normal.  Neurological:     Mental Status: She is alert and oriented to person, place, and time.     Vital Signs: BP (!) 91/50   Pulse 87   Temp (!) 97.5 F (36.4 C) (Oral)   Resp (!) 23   Ht 5' (1.524 m)   Wt 82.8 kg   SpO2 100%   BMI  35.65 kg/m  Pain Scale: 0-10   Pain Score: 0-No pain   SpO2: SpO2: 100 % O2 Device:SpO2: 100 % O2 Flow Rate: .O2 Flow Rate (L/min): 20 L/min  IO: Intake/output summary:  Intake/Output Summary (Last 24 hours) at 11/30/2021 1549 Last data filed at 11/30/2021 1500 Gross per 24 hour  Intake 3560.23 ml  Output 6351 ml  Net -2790.77 ml    LBM: Last BM Date : 11/26/21 Baseline Weight: Weight: 89.4 kg Most recent weight: Weight: 82.8 kg       Thank you for this consult. Palliative medicine will continue to follow and assist as needed.  Time Total: 80 minutes Greater than 50%  of this time was spent counseling and coordinating care related to the above assessment and plan.  Signed by: Mariana Kaufman, AGNP-C Palliative Medicine     Please contact Palliative Medicine Team phone at [phone] for questions and concerns.  For individual provider: See Shea Evans

## 2021-11-30 NOTE — Progress Notes (Addendum)
ANTICOAGULATION CONSULT NOTE - Initial Consult  Pharmacy Consult for bivalirudin Indication:  R/O HIT  No Known Allergies  Patient Measurements: Height: 5' (152.4 cm) Weight: 82.8 kg (182 lb 8.7 oz) IBW/kg (Calculated) : 45.5   Vital Signs: Temp: 97.5 F (36.4 C) (08/07 1106) Temp Source: Oral (08/07 1106) BP: 91/50 (08/07 1149) Pulse Rate: 85 (08/07 1314)  Labs: Recent Labs    11/28/21 0447 11/28/21 2039 11/29/21 0351 11/29/21 1533 11/30/21 0412 11/30/21 0838  HGB 9.2*  --  8.8*  --  8.8*  8.9*  --   HCT 26.5*  --  26.2*  --  25.7*  25.7*  --   PLT 115*  --  98*  --  71*  68* 70*  APTT  --   --  141*  --  120* 120*  LABPROT  --   --   --   --   --  15.1  INR  --   --   --   --   --  1.2  CREATININE 0.86   < > 0.70 0.72 0.82  --    < > = values in this interval not displayed.    Estimated Creatinine Clearance: 75.7 mL/min (by C-G formula based on SCr of 0.82 mg/dL).   Medical History: Past Medical History:  Diagnosis Date   CHF (congestive heart failure) ([place])    Diabetes ([place])    Hypertension    Inflammatory myopathy    treated 2012   Pulmonary hypertension ([place])    Wears glasses    reading    Medications:  Infusions:    prismasol BGK 4/2.5 500 mL/hr at 11/30/21 0241    prismasol BGK 4/2.5 300 mL/hr at 11/30/21 1403   sodium chloride 10 mL/hr at 11/29/21 1847   sodium chloride     sodium chloride     bivalirudin (ANGIOMAX) 250 mg in sodium chloride 0.9 % 500 mL (0.5 mg/mL) infusion 0.05 mg/kg/hr (11/30/21 1400)   ceFEPime (MAXIPIME) IV     DOBUTamine 5 mcg/kg/min (11/30/21 1400)   norepinephrine (LEVOPHED) Adult infusion 34 mcg/min (11/30/21 1400)   prismasol BGK 4/2.5 1,500 mL/hr at 11/30/21 1200   [START ON Dec 27, 2021] vancomycin     vancomycin      Assessment: 56 yo female on SQ heparin for DVT prophylaxis and heparin with CRRT.  Now with significant decrease in platelet count (baseline 249 > 68).  MD ordering HIT assays, and pharmacy asked  to start bivalirudin.  Baseline aPTT elevated at 120.  No overt bleeding or complications noted.  APTT 4  hrs after bivalirudin started = 124.  Likely residual effect of heparin that was in CRRT circuit.   Goal of Therapy:  aPTT 50-85 seconds Monitor platelets by anticoagulation protocol: Yes   Plan:  Decrease bivalirudin to 0.04 mg/kg/hr. Check aPTT in 4 hrs. F/u HIT antibody and SRA.  [place] Crane, Roylene Reason, BCCP Clinical Pharmacist  11/30/2021 2:25 PM   [facility] pharmacy phone numbers are listed on [email or web address]

## 2021-11-30 NOTE — Progress Notes (Signed)
Patient ID: Shannon [name], female   DOB: [birth year], 56 y.o.   MRN: [id number] P    Advanced Heart Failure Rounding Note  PCP-Cardiologist: Fransico Him, MD   Subjective:    7/31: started on CRRT 8/1: NE added for BP support to facilitate CRRT volume removal  8/22: Co-ox 41% (confirmed x 2), DBA increased to 5.  constipation, N/V + abd distention. KUB unremarkable. NGT placed. Reglan started   Continues on 5 DBA.  NE now up to 30 to maintain MAP for CVVH. CO-OX 61.5%.   CVP 29 still. I/Os net negative 2376, weight down. Now pulling for net negative UF 150 cc/hr consistently via CVVH. SBP ranging 70s-90s.    Bipap at night, HFNC during the day.   Plts down to 68K, getting [place] heparin and heparin with CVVH.   Echo: EF 60-65%, D-shaped septum, moderate RV enlargement/moderate RV dysfunction, PASP 59, mod-severe TR, IVC dilated.    Objective:   Weight Range: 82.8 kg Body mass index is 35.65 kg/m.   Vital Signs:   Temp:  [96.5 F (35.8 C)-97.6 F (36.4 C)] 97.5 F (36.4 C) (08/07 0751) Pulse Rate:  [40-92] 89 (08/07 0700) Resp:  [14-30] 17 (08/07 0700) BP: (70-90)/(47-77) 86/65 (08/07 0700) SpO2:  [92 %-100 %] 96 % (08/07 0600) FiO2 (%):  [80 %-85 %] 85 % (08/06 2022) Weight:  [82.8 kg] 82.8 kg (08/07 0500) Last BM Date : 11/26/21  Weight change: Filed Weights   11/28/21 0400 11/29/21 0500 11/30/21 0500  Weight: 83.7 kg 85.7 kg 82.8 kg    Intake/Output:   Intake/Output Summary (Last 24 hours) at 11/30/2021 0803 Last data filed at 11/30/2021 0700 Gross per 24 hour  Intake 2944.34 ml  Output 5486 ml  Net -2541.66 ml      Physical Exam  CVP 29 General: NAD Neck: JVP 16+, no thyromegaly or thyroid nodule.  Lungs: Clear to auscultation bilaterally with normal respiratory effort. CV: Nondisplaced PMI.  Heart regular S1/S2, no S3/S4, no murmur.  1+ edema to knees.  Abdomen: Soft, nontender, no hepatosplenomegaly, no distention.  Skin: Intact without lesions or  rashes.  Neurologic: Alert and oriented x 3.  Psych: Normal affect. Extremities: No clubbing or cyanosis.  HEENT: Normal.    Telemetry   SR 80s, personally reviewed.   Labs    CBC Recent Labs    11/29/21 0351 11/30/21 0412  WBC 19.9* 12.1*  HGB 8.8* 8.9*  HCT 26.2* 25.7*  MCV 84.0 81.6  PLT 98* 68*   Basic Metabolic Panel Recent Labs    11/29/21 0351 11/29/21 1533 11/30/21 0412  NA 130* 132* 132*  K 4.2 4.4 4.0  CL 97* 99 99  CO2 _0 GLUCOSE 166* 128* 139*  BUN 5* 6 7  CREATININE 0.70 0.72 0.82  CALCIUM 8.1* 8.6* 8.6*  MG 2.2  --  2.4  PHOS 2.2* 1.8* 3.4   Liver Function Tests Recent Labs    11/29/21 1533 11/30/21 0412  ALBUMIN 3.2* 3.1*   No results for input(s): "LIPASE", "AMYLASE" in the last 72 hours. Cardiac Enzymes No results for input(s): "CKTOTAL", "CKMB", "CKMBINDEX", "TROPONINI" in the last 72 hours.   BNP: BNP (last 3 results) Recent Labs    09/10/21 1614 11/11/21 1620 11/21/2021 1510  BNP 916.7* 1,063.1* 1,014.0*    ProBNP (last 3 results) No results for input(s): "PROBNP" in the last 8760 hours.   D-Dimer No results for input(s): "DDIMER" in the last 72 hours. Hemoglobin A1C No  results for input(s): "HGBA1C" in the last 72 hours.  Fasting Lipid Panel No results for input(s): "CHOL", "HDL", "LDLCALC", "TRIG", "CHOLHDL", "LDLDIRECT" in the last 72 hours. Thyroid Function Tests No results for input(s): "TSH", "T4TOTAL", "T3FREE", "THYROIDAB" in the last 72 hours.  Invalid input(s): "FREET3"  Other results:   Imaging    DG CHEST PORT 1 VIEW  Result Date: 11/29/2021 CLINICAL DATA:  Respiratory distress.  Pneumonia. EXAM: PORTABLE CHEST 1 VIEW COMPARISON:  Radiographs 11/26/2021 and 11/23/2021.  CT 11/07/2021. FINDINGS: 1028 hours. Enteric tube has been removed. Left subclavian central venous catheter projects to the level of the mid right atrium, unchanged. Interval exchange of the right IJ central venous catheter,  projecting to the level of the superior cavoatrial junction. The heart size and mediastinal contours are stable. There are lower lung volumes with increasing bibasilar pulmonary opacities and poor definition of the pulmonary vasculature. There are possible small bilateral pleural effusions. No evidence of pneumothorax. No acute osseous findings are evident. Telemetry leads overlie the chest. IMPRESSION: 1. Increasing bibasilar pulmonary opacities with probable pulmonary edema and small pleural effusions. 2. Support system positioned as above.  No pneumothorax. Electronically Signed   By: Richardean Sale M.D.   On: 11/29/2021 12:04     Medications:     Scheduled Medications:  (feeding supplement) PROSource Plus  30 mL Oral BID BM   Chlorhexidine Gluconate Cloth  6 each Topical Daily   famotidine  20 mg Oral Daily   feeding supplement (NEPRO CARB STEADY)  237 mL Oral Q2000   Gerhardt's butt cream   Topical TID   insulin aspart  0-6 Units Subcutaneous Q4H   macitentan  10 mg Oral Daily   midodrine  15 mg Oral TID WC   multivitamin  1 tablet Oral QHS   mouth rinse  15 mL Mouth Rinse 4 times per day   polyethylene glycol  17 g Oral Daily   senna-docusate  1 tablet Oral BID   sodium chloride flush  3 mL Intravenous Q12H    Infusions:   prismasol BGK 4/2.5 500 mL/hr at 11/30/21 0241    prismasol BGK 4/2.5 300 mL/hr at 11/29/21 2147   sodium chloride 10 mL/hr at 11/29/21 1847   sodium chloride     sodium chloride     DOBUTamine 5 mcg/kg/min (11/30/21 0700)   norepinephrine (LEVOPHED) Adult infusion 28 mcg/min (11/30/21 0700)   prismasol BGK 4/2.5 1,500 mL/hr at 11/30/21 0512    PRN Medications: Place/Maintain arterial line **AND** sodium chloride, sodium chloride, Place/Maintain arterial line **AND** sodium chloride, bisacodyl, docusate, ondansetron (ZOFRAN) IV, mouth rinse, polyethylene glycol, simethicone, sodium chloride, sodium chloride flush, white petrolatum    Assessment/Plan    1. Pulmonary hypertension/RV failure: Echo in 11/22 showed EF 60-65%, mild LVH, severe RV enlargement with severely decreased RV systolic function, PASP 66 mmHg, severe RAE, moderate TR, moderate PR, small pericardial effusion. CTA chest showed no PE, normal lung fields and V/Q scan showed no evidence for chronic PE.  PFTs showed severe restriction and decreased DLCO.  RHC/LHC showed severe pulmonary arterial HTN with RV failure.  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  No history of liver disease.  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  ANA, SCL-70, anti-centromere antibody, and RF negative with elevated CRP. She may have connective tissue disease-related PAH though she saw rheumatology and no definite diagnosis was given.  High  resolution CT chest showed no evidence for ILD.  She was admitted this time with worsening RV failure and AKI. Echo this admission with EF 60-65%, D-shaped septum, moderate RV enlargement/moderate RV dysfunction, PASP 59, mod-severe TR, IVC dilated. CVP is still 29 this morning, marked volume overload on exam.  She was started on dobutamine 2.5 for RV support, ultimately further titrated to 5 after Co-ox dropped to 40%. Oliguric despite high dose lasix gtt at 30/hr. Started on CRRT 7/31 for intractable volume overload. This morning on DBA 5 + 30 NE. Co-ox 61.5%.  Good I/Os yesterday, pulling UF net negative 150 cc/hr consistently via CVVH. CVP still 29 though weight down. - On high dose of NE to allow CVVH, need more accurate BP => place arterial line.  - Continue DBA + NE.  - Continue to decongest RV with markedly elevated CVP, tolerating CVVH net UF 150 cc/hr but requiring high dose NE.  - Continue midodrine 15 tid.  - Continue Opsumit 10 mg daily.  - Transitioned tadalafil to sildenafil with AKI, on 20 mg tid.  - Prognosis increasingly poor, we still have a lot of fluid to  remove and she is now requiring high pressor dose to allow CVVH.  I am also concerned that she will not have renal recovery.  Not good long-term HD candidate.  Will involve palliative care, I mentioned consideration of hospice to her.  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 She does not report myalgias or muscle pain.  Myositis panel was negative.  She saw rheumatology and no definite diagnosis was given. CK normal at 62 this admission.  3. Acute on chronic hypoxemic respiratory failure: On 2L home oxygen at baseline, currently on HFNC with Bipap at night.  Off antibiotics.  4. AKI on CKD stage 3: Creatinine had been slowly rising at home with diuresis, suspect cardiorenal syndrome.  However, up to 3.3 at admission with marked hyperkalemia. No arrhythmias. K now normal. Anuric.  - Continue trial of CVVH to see if we can decongest her then titrate off the CVVH. Would tolerate iHD poorly with severe RV failure. - Nephrology following. Appreciate their assistance  5. ID: CT chest with LLL infiltrate => completed Ceftriaxone/azithromycin. WBCs lower today and afebrile.   - CXR today.  6. Thrombocytopenia: Worsening, stop heparin products and start bivalirudin, check HIT.   CRITICAL CARE Performed by: Loralie Champagne  Total critical care time: 40 minutes  Critical care time was exclusive of separately billable procedures and treating other patients.  Critical care was necessary to treat or prevent imminent or life-threatening deterioration.  Critical care was time spent personally by me on the following activities: development of treatment plan with patient and/or surrogate as well as nursing, discussions with consultants, evaluation of patient's response to treatment, examination of patient, obtaining history from patient or surrogate, ordering  and performing treatments and interventions, ordering and review of laboratory studies, ordering and review of radiographic studies, pulse oximetry and re-evaluation of patient's condition.   Length of Stay: [place], MD  11/30/2021, 8:03 AM  Advanced Heart Failure Team Pager [phone] (M-F; 7a - 5p)  Please contact [facility] for night-coverage after hours (5p -7a ) and weekends on [email or web address]

## 2021-12-01 ENCOUNTER — Inpatient Hospital Stay (HOSPITAL_COMMUNITY): Payer: 59

## 2021-12-01 DIAGNOSIS — I50810 Right heart failure, unspecified: Secondary | ICD-10-CM | POA: Diagnosis not present

## 2021-12-01 DIAGNOSIS — Z515 Encounter for palliative care: Secondary | ICD-10-CM

## 2021-12-01 DIAGNOSIS — J9601 Acute respiratory failure with hypoxia: Secondary | ICD-10-CM

## 2021-12-01 LAB — POCT I-STAT 7, (LYTES, BLD GAS, ICA,H+H)
Acid-base deficit: 3 mmol/L — ABNORMAL HIGH (ref 0.0–2.0)
Bicarbonate: 26.3 mmol/L (ref 20.0–28.0)
Calcium, Ion: 1.19 mmol/L (ref 1.15–1.40)
HCT: 33 % — ABNORMAL LOW (ref 36.0–46.0)
Hemoglobin: 11.2 g/dL — ABNORMAL LOW (ref 12.0–15.0)
O2 Saturation: 79 %
Patient temperature: 97.7
Potassium: 3.5 mmol/L (ref 3.5–5.1)
Sodium: 139 mmol/L (ref 135–145)
TCO2: 28 mmol/L (ref 22–32)
pCO2 arterial: 68.4 mmHg (ref 32–48)
pH, Arterial: 7.19 — CL (ref 7.35–7.45)
pO2, Arterial: 53 mmHg — ABNORMAL LOW (ref 83–108)

## 2021-12-01 LAB — COOXEMETRY PANEL
Carboxyhemoglobin: 1.4 % (ref 0.5–1.5)
Carboxyhemoglobin: 1.2 % (ref 0.5–1.5)
Methemoglobin: <0.7 % (ref 0.0–1.5)
Methemoglobin: <0.7 % (ref 0.0–1.5)
O2 Saturation: 46.7 %
O2 Saturation: 44.2 %
Total hemoglobin: 10.2 g/dL — ABNORMAL LOW (ref 12.0–16.0)
Total hemoglobin: 6.9 g/dL — CL (ref 12.0–16.0)

## 2021-12-01 LAB — APTT
aPTT: 81 seconds — ABNORMAL HIGH (ref 24–36)
aPTT: 93 seconds — ABNORMAL HIGH (ref 24–36)

## 2021-12-01 LAB — RENAL FUNCTION PANEL
Albumin: 3.1 g/dL — ABNORMAL LOW (ref 3.5–5.0)
Anion gap: 8 (ref 5–15)
BUN: 9 mg/dL (ref 6–20)
CO2: 23 mmol/L (ref 22–32)
Calcium: 8.7 mg/dL — ABNORMAL LOW (ref 8.9–10.3)
Chloride: 99 mmol/L (ref 98–111)
Creatinine, Ser: 0.83 mg/dL (ref 0.44–1.00)
GFR, Estimated: >60 mL/min (ref >60)
Glucose, Bld: 105 mg/dL — ABNORMAL HIGH (ref 70–99)
Phosphorus: 2.3 mg/dL — ABNORMAL LOW (ref 2.5–4.6)
Potassium: 3.9 mmol/L (ref 3.5–5.1)
Sodium: 130 mmol/L — ABNORMAL LOW (ref 135–145)

## 2021-12-01 LAB — CBC
HCT: 24.8 % — ABNORMAL LOW (ref 36.0–46.0)
Hemoglobin: 8.4 g/dL — ABNORMAL LOW (ref 12.0–15.0)
MCH: 28.3 pg (ref 26.0–34.0)
MCHC: 33.9 g/dL (ref 30.0–36.0)
MCV: 83.5 fL (ref 80.0–100.0)
Platelets: 76 10*3/uL — ABNORMAL LOW (ref 150–400)
RBC: 2.97 MIL/uL — ABNORMAL LOW (ref 3.87–5.11)
RDW: 19.2 % — ABNORMAL HIGH (ref 11.5–15.5)
WBC: 10.1 10*3/uL (ref 4.0–10.5)
nRBC: 12.1 % — ABNORMAL HIGH (ref 0.0–0.2)

## 2021-12-01 LAB — PROCALCITONIN: Procalcitonin: 1.99 ng/mL

## 2021-12-01 LAB — GLUCOSE, CAPILLARY
Glucose-Capillary: 63 mg/dL — ABNORMAL LOW (ref 70–99)
Glucose-Capillary: 85 mg/dL (ref 70–99)
Glucose-Capillary: 72 mg/dL (ref 70–99)

## 2021-12-01 LAB — MAGNESIUM: Magnesium: 2.3 mg/dL (ref 1.7–2.4)

## 2021-12-01 LAB — HEPARIN INDUCED PLATELET AB (HIT ANTIBODY): Heparin Induced Plt Ab: 0.448 OD — ABNORMAL HIGH (ref 0.000–0.400)

## 2021-12-01 MED ORDER — RENA-VITE PO TABS: 1.0000 | ORAL_TABLET | Freq: Every day | ORAL | Status: DC

## 2021-12-01 MED ORDER — POLYETHYLENE GLYCOL 3350 17 G PO PACK: 17.0000 g | PACK | Freq: Every day | ORAL | Status: DC | PRN

## 2021-12-01 MED ORDER — MIDODRINE HCL 5 MG PO TABS
5.0000 mg | ORAL_TABLET | Freq: Three times a day (TID) | ORAL | Status: DC
  Filled 2021-12-01: qty 1

## 2021-12-01 MED ORDER — ALTEPLASE 2 MG IJ SOLR: 2.0000 mg | Freq: Once | INTRAMUSCULAR | Status: AC

## 2021-12-01 MED ORDER — FENTANYL BOLUS VIA INFUSION
50.0000 ug | INTRAVENOUS | Status: DC | PRN
  Administered 2021-12-01: 100 ug via INTRAVENOUS

## 2021-12-01 MED ORDER — FENTANYL CITRATE PF 50 MCG/ML IJ SOSY: 50.0000 ug | PREFILLED_SYRINGE | Freq: Once | INTRAMUSCULAR | Status: DC

## 2021-12-01 MED ORDER — DOCUSATE SODIUM 50 MG/5ML PO LIQD: 100.0000 mg | Freq: Two times a day (BID) | ORAL | Status: DC

## 2021-12-01 MED ORDER — POLYETHYLENE GLYCOL 3350 17 G PO PACK: 17.0000 g | PACK | Freq: Every day | ORAL | Status: DC

## 2021-12-01 MED ORDER — MIDAZOLAM HCL 2 MG/2ML IJ SOLN
INTRAMUSCULAR | Status: AC
  Filled 2021-12-01: qty 2

## 2021-12-01 MED ORDER — SODIUM CHLORIDE 0.9 % IV SOLN
0.0300 mg/kg/h | INTRAVENOUS | Status: DC
  Filled 2021-12-01: qty 250

## 2021-12-01 MED ORDER — SODIUM BICARBONATE 8.4 % IV SOLN
INTRAVENOUS | Status: AC
  Administered 2021-12-01: 100 meq
  Filled 2021-12-01: qty 100

## 2021-12-01 MED ORDER — MIDAZOLAM HCL 2 MG/2ML IJ SOLN: 2.0000 mg | INTRAMUSCULAR | Status: DC | PRN

## 2021-12-01 MED ORDER — NEPRO/CARBSTEADY PO LIQD: 237.0000 mL | Freq: Every day | ORAL | Status: DC

## 2021-12-01 MED ORDER — ETOMIDATE 2 MG/ML IV SOLN
INTRAVENOUS | Status: AC
  Administered 2021-12-01: 20 mg
  Filled 2021-12-01: qty 20

## 2021-12-01 MED ORDER — EPINEPHRINE 1 MG/10ML IJ SOSY
PREFILLED_SYRINGE | INTRAMUSCULAR | Status: AC
  Filled 2021-12-01: qty 20

## 2021-12-01 MED ORDER — FAMOTIDINE 40 MG/5ML PO SUSR
20.0000 mg | Freq: Every day | ORAL | Status: DC
  Filled 2021-12-01: qty 2.5

## 2021-12-01 MED ORDER — ORAL CARE MOUTH RINSE: 15.0000 mL | OROMUCOSAL | Status: DC

## 2021-12-01 MED ORDER — SIMETHICONE 80 MG PO CHEW: 80.0000 mg | CHEWABLE_TABLET | Freq: Four times a day (QID) | ORAL | Status: DC | PRN

## 2021-12-01 MED ORDER — DOBUTAMINE IN D5W 4-5 MG/ML-% IV SOLN: 7.5000 ug/kg/min | INTRAVENOUS | Status: DC

## 2021-12-01 MED ORDER — EPINEPHRINE HCL 5 MG/250ML IV SOLN IN NS
INTRAVENOUS | Status: AC
  Filled 2021-12-01: qty 250

## 2021-12-01 MED ORDER — VASOPRESSIN 20 UNITS/100 ML INFUSION FOR SHOCK
0.0400 [IU]/min | INTRAVENOUS | Status: DC
  Administered 2021-12-01: 0.04 [IU]/min via INTRAVENOUS
  Filled 2021-12-01: qty 100

## 2021-12-01 MED ORDER — K PHOS MONO-SOD PHOS DI & MONO 155-852-130 MG PO TABS
500.0000 mg | ORAL_TABLET | Freq: Once | ORAL | Status: DC
  Filled 2021-12-01: qty 2

## 2021-12-01 MED ORDER — FENTANYL 2500MCG IN NS 250ML (10MCG/ML) PREMIX INFUSION
50.0000 ug/h | INTRAVENOUS | Status: DC
  Administered 2021-12-01: 50 ug/h via INTRAVENOUS
  Filled 2021-12-01: qty 250

## 2021-12-01 MED ORDER — ROCURONIUM BROMIDE 10 MG/ML (PF) SYRINGE
PREFILLED_SYRINGE | INTRAVENOUS | Status: AC
  Administered 2021-12-01: 60 mg
  Filled 2021-12-01: qty 10

## 2021-12-01 MED ORDER — VITAL HIGH PROTEIN PO LIQD
1000.0000 mL | ORAL | Status: DC
Start: 2021-12-01 — End: 2021-12-01

## 2021-12-01 MED ORDER — PROSOURCE TF20 ENFIT COMPATIBL EN LIQD: 60.0000 mL | Freq: Every day | ENTERAL | Status: DC

## 2021-12-01 MED ORDER — K PHOS MONO-SOD PHOS DI & MONO 155-852-130 MG PO TABS
500.0000 mg | ORAL_TABLET | Freq: Once | ORAL | Status: AC
  Administered 2021-12-01: 500 mg
  Filled 2021-12-01: qty 2

## 2021-12-01 MED ORDER — MIDODRINE HCL 5 MG PO TABS: 5.0000 mg | ORAL_TABLET | Freq: Three times a day (TID) | ORAL | Status: DC

## 2021-12-01 MED ORDER — EPINEPHRINE HCL 5 MG/250ML IV SOLN IN NS
0.5000 ug/min | INTRAVENOUS | Status: DC
  Administered 2021-12-01: 5 ug/min via INTRAVENOUS

## 2021-12-01 MED ORDER — SENNOSIDES 8.8 MG/5ML PO SYRP: 5.0000 mL | ORAL_SOLUTION | Freq: Two times a day (BID) | ORAL | Status: DC

## 2021-12-01 MED ORDER — ORAL CARE MOUTH RINSE: 15.0000 mL | OROMUCOSAL | Status: DC | PRN

## 2021-12-01 MED ORDER — ALTEPLASE 2 MG IJ SOLR
INTRAMUSCULAR | Status: AC
  Administered 2021-12-01: 2 mg
  Filled 2021-12-01: qty 2

## 2021-12-01 MED ORDER — CHLORHEXIDINE GLUCONATE 0.12 % MT SOLN
15.0000 mL | Freq: Once | OROMUCOSAL | Status: DC
  Filled 2021-12-01: qty 15

## 2021-12-01 MED ORDER — PANTOPRAZOLE 2 MG/ML SUSPENSION
40.0000 mg | Freq: Every day | ORAL | Status: DC
  Administered 2021-12-01: 40 mg
  Filled 2021-12-01: qty 20

## 2021-12-01 MED ORDER — FENTANYL CITRATE PF 50 MCG/ML IJ SOSY
PREFILLED_SYRINGE | INTRAMUSCULAR | Status: AC
  Filled 2021-12-01: qty 2

## 2021-12-03 LAB — SEROTONIN RELEASE ASSAY (SRA)
SRA .2 IU/mL UFH Ser-aCnc: <1 % (ref 0–20)
SRA 100IU/mL UFH Ser-aCnc: <1 % (ref 0–20)

## 2021-12-07 ENCOUNTER — Encounter (HOSPITAL_BASED_OUTPATIENT_CLINIC_OR_DEPARTMENT_OTHER): Payer: 59 | Admitting: Cardiology

## 2021-12-25 NOTE — Progress Notes (Signed)
ANTICOAGULATION CONSULT NOTE   Pharmacy Consult for bivalirudin Indication:  R/O HIT  No Known Allergies  Patient Measurements: Height: 5' (152.4 cm) Weight: 69.1 kg (152 lb 5.4 oz) IBW/kg (Calculated) : 45.5   Vital Signs: Temp: 97.7 F (36.5 C) (08/08 1111) Temp Source: Oral (08/08 1111) Pulse Rate: 86 (08/08 1130)  Labs: Recent Labs    11/30/21 0412 11/30/21 0838 11/30/21 1333 11/30/21 1710 11/30/21 1723 11/30/21 1843 11/30/21 1934 12/14/2021 0217 12-14-21 0417 December 14, 2021 0840 12/14/21 1139  HGB 8.8*  8.9*  --   --   --    < > 11.6*  --   --  8.4*  --  11.2*  HCT 25.7*  25.7*  --   --   --    < > 34.0*  --   --  24.8*  --  33.0*  PLT 71*  68* 70*  --   --   --   --   --   --  76*  --   --   APTT 120* 120*   < >  --   --   --  110* 81*  --  93*  --   LABPROT  --  15.1  --   --   --   --   --   --   --   --   --   INR  --  1.2  --   --   --   --   --   --   --   --   --   CREATININE 0.82  --   --  0.78  --   --   --   --  0.83  --   --    < > = values in this interval not displayed.     Estimated Creatinine Clearance: 67.9 mL/min (by C-G formula based on SCr of 0.83 mg/dL).   Medical History: Past Medical History:  Diagnosis Date   CHF (congestive heart failure) ([place])    Diabetes ([place])    Hypertension    Inflammatory myopathy    treated 2012   Pulmonary hypertension ([place])    Wears glasses    reading    Medications:  Infusions:    prismasol BGK 4/2.5 500 mL/hr at 12/14/2021 0114    prismasol BGK 4/2.5 300 mL/hr at 14-Dec-2021 0433   sodium chloride 10 mL/hr at 11/29/21 1847   sodium chloride     sodium chloride Stopped (11/30/21 1457)   bivalirudin (ANGIOMAX) 250 mg in sodium chloride 0.9 % 500 mL (0.5 mg/mL) infusion 0.03 mg/kg/hr (December 14, 2021 1200)   ceFEPime (MAXIPIME) IV Stopped (2021-12-14 1035)   DOBUTamine 7.5 mcg/kg/min (14-Dec-2021 1200)   epinephrine 5 mcg/min (12/14/2021 1200)   fentaNYL infusion INTRAVENOUS 50 mcg/hr (12-14-2021 1204)   norepinephrine  (LEVOPHED) Adult infusion 60 mcg/min (Dec 14, 2021 1200)   prismasol BGK 4/2.5 1,500 mL/hr at 12/14/2021 7673   vancomycin     vasopressin 0.04 Units/min (2021-12-14 1200)    Assessment: 56 yo female on SQ heparin for DVT prophylaxis and heparin with CRRT.  Now with significant decrease in platelet count (baseline 249 > 68).  MD ordering HIT assays, and pharmacy asked to start bivalirudin.  APTT this AM over goal at 93 seconds.  No overt bleeding or complications noted, platelet count low but stabilizing.  Patient's weight reportedly down ~ 30 lbs, re-weighed twice this AM.    Goal of Therapy:  aPTT 50-85 seconds Monitor platelets by anticoagulation protocol: Yes  Plan:  Continue bivalirudin 0.03 mg/kg/hr, but reduce dosing weight from 82.8 kg to 69.1 kg. Recheck aPTT in 4 hrs. Daily aPTT and CBC.  [place] Crane, Roylene Reason, BCCP Clinical Pharmacist  14-Dec-2021 12:20 PM   [facility] pharmacy phone numbers are listed on [email or web address]

## 2021-12-25 NOTE — Progress Notes (Signed)
ANTICOAGULATION CONSULT NOTE - Follow Up Consult  Pharmacy Consult for bivalirudin Indication:  r/o HIT  Labs: Recent Labs    11/29/21 0351 11/29/21 1533 11/30/21 0412 11/30/21 0838 11/30/21 1333 11/30/21 1710 11/30/21 1723 11/30/21 1843 11/30/21 1934 12/20/21 0217  HGB 8.8*  --  8.8*  8.9*  --   --   --  11.2* 11.6*  --   --   HCT 26.2*  --  25.7*  25.7*  --   --   --  33.0* 34.0*  --   --   PLT 98*  --  71*  68* 70*  --   --   --   --   --   --   APTT 141*  --  120* 120* 124*  --   --   --  110* 81*  LABPROT  --   --   --  15.1  --   --   --   --   --   --   INR  --   --   --  1.2  --   --   --   --   --   --   CREATININE 0.70 0.72 0.82  --   --  0.78  --   --   --   --     Assessment/Plan:  56yo female therapeutic on bivalirudin after rate changes. Will continue infusion at current rate of 0.03 mg/kg/hr and confirm stable with additional PTT.   Wynona Neat, PharmD, BCPS  20-Dec-2021,4:13 AM

## 2021-12-25 NOTE — Progress Notes (Signed)
Physical Therapy Treatment Patient Details Name: Shannon [name] MRN: [id number] DOB: Sep 19, 1965 Today's Date: Dec 30, 2021   History of Present Illness 56 y.o. female admitted 11/12/2021 with SOB, progressive weakness, AMS. CXR with LLL atelectasis and consolidation. Workup for acute on chronic hypoxemic/hypercarbic respiratory failure due to decompensated R HF and possible LLL CAP. Pt with AKI; CRRT initiated 7/31. NO started 8/8. PMH includes CHF, DM, HTN, pulmonary HTN.    PT Comments    Pt pleasant and on break from CRRT due to clot with pt on HHFNC and NO this session with pt with limited activity tolerance due to fatigue. Pt transferred from chair to bed with limited ability to perform any HEP. Encouraged OOB as able to tolerate and will continue to follow.   Shannon [name] 35L at 100% FiO2, NO at 20 SPO2 93% HR85   Recommendations for follow up therapy are one component of a multi-disciplinary discharge planning process, led by the attending physician.  Recommendations may be updated based on patient status, additional functional criteria and insurance authorization.  Follow Up Recommendations  Home health PT     Assistance Recommended at Discharge Frequent or constant Supervision/Assistance  Patient can return home with the following A little help with walking and/or transfers;A little help with bathing/dressing/bathroom;Assistance with cooking/housework;Assist for transportation;Help with stairs or ramp for entrance   Equipment Recommendations  Wheelchair (measurements PT);Rolling walker (2 wheels)    Recommendations for Other Services       Precautions / Restrictions Precautions Precautions: Fall;Other (comment) Precaution Comments: CRRT, HHFNC(35LO2 at 100 FiO2), NO     Mobility  Bed Mobility Overal bed mobility: Needs Assistance Bed Mobility: Sit to Supine     Supine to sit: Mod assist     General bed mobility comments: assist to lift legs to surface, pt able to position  in midline    Transfers Overall transfer level: Needs assistance   Transfers: Sit to/from Stand, Bed to chair/wheelchair/BSC Sit to Stand: Min assist   Step pivot transfers: Min guard       General transfer comment: cues for hand placement, assist to power up pt denied repeated trials or attempts. Stepping pivot from chair to bed with RW grossly 4'    Ambulation/Gait                   Stairs             Wheelchair Mobility    Modified Rankin (Stroke Patients Only)       Balance Overall balance assessment: Needs assistance   Sitting balance-Leahy Scale: Fair Sitting balance - Comments: sitting without UE support   Standing balance support: Bilateral upper extremity supported, Reliant on assistive device for balance Standing balance-Leahy Scale: Poor Standing balance comment: standing with reliance on UE support of RW                            Cognition Arousal/Alertness: Awake/alert Behavior During Therapy: Flat affect Overall Cognitive Status: Within Functional Limits for tasks assessed                                          Exercises General Exercises - Lower Extremity Heel Slides: AROM, Both, 5 reps, Supine    General Comments        Pertinent Vitals/Pain Pain Assessment Pain Score: 3  Pain Location: sacrum  Pain Descriptors / Indicators: Sore Pain Intervention(s): Limited activity within patient's tolerance, Monitored during session, Repositioned    Home Living                          Prior Function            PT Goals (current goals can now be found in the care plan section) Progress towards PT goals: Progressing toward goals    Frequency    Min 3X/week      PT Plan Current plan remains appropriate    Co-evaluation              AM-PAC PT "6 Clicks" Mobility   Outcome Measure  Help needed turning from your back to your side while in a flat bed without using bedrails?:  A Little Help needed moving from lying on your back to sitting on the side of a flat bed without using bedrails?: A Lot Help needed moving to and from a bed to a chair (including a wheelchair)?: A Little Help needed standing up from a chair using your arms (e.g., wheelchair or bedside chair)?: A Lot Help needed to walk in hospital room?: A Lot Help needed climbing 3-5 steps with a railing? : Total 6 Click Score: 13    End of Session Equipment Utilized During Treatment: Oxygen Activity Tolerance: Patient tolerated treatment well Patient left: in bed;with call bell/phone within reach;with nursing/sitter in room;with family/visitor present Nurse Communication: Mobility status PT Visit Diagnosis: Other abnormalities of gait and mobility (R26.89);Muscle weakness (generalized) (M62.81)     Time: 7414-2395 PT Time Calculation (min) (ACUTE ONLY): 17 min  Charges:  $Therapeutic Activity: 8-22 mins                     Shannon [name], PT Acute Rehabilitation Services Office: [phone]    Shannon [name] Shannon [name] 12/25/21, 9:55 AM

## 2021-12-25 NOTE — Progress Notes (Signed)
Daily Progress Note   Patient Name: Shannon [name]       Date: 12/11/2021 DOB: 22-Jun-1965  Age: 56 y.o. MRN#: [id number] Attending Physician: Shannon Dresser, MD Primary Care Physician: Shannon Brine, FNP Admit Date: 11/03/2021  Reason for Consultation/Follow-up: Establishing goals of care  Patient Profile/HPI:  56 y.o. female  with past medical history of pulmonary hypertension, admitted on 11/16/2021 with shortness of breath, confusion. Required bipap in ED for respiratory distress. Workup revealed decompensated R H failure with acute hypoxic and hypercarbic respiratory failure. She was started on dobutamine in ED. She had significant hyperkalemia.  CT of the chest showed left lower lobe consolidation.  Her admission has been complicated by cardiorenal syndrome.  She was started on CRRT 7/31 due to hypotension and worsening volume overload.  She is not a candidate for intermittent hemodialysis.  Per cardiology her prognosis is poor she is requiring higher pressor doses to allow for CRRT and she is not having renal recovery she is not a candidate for long-term HD.  She is currently dependent on heated high flow nasal cannula. Dr. Aundra [name] mentioned hospice to her.  Subjective: Chart reviewed including progress notes labs and imaging.   Dr. Aundra [name] and Dr. Tamala [name] had direct conversation with her and her friend Shannon [name] this morning regarding her status.  Dobutamine was increased.  Epinephrine, and vasopressin were added.  Patient with respiratory distress after CRRT off a brief time due to access clotting off.  There is also question of unmasking of pulmonary venoocclusive disease with initiation of epi and vasopressin.  She was intubated. Her friend Shannon [name] shares with me that [name] looked at her and  said "I am dying". I met with Shannon [name] and Shannon [name].  Answered their questions about patient's current status. We discussed CODE STATUS.  Shannon [name] and Shannon [name] agreed that if despite everything were doing Shannon [name]'s heart stopped that she should not receive CPR. We discussed her further plan of care. Plan was made to continue current measures for the next 24 hours with the goal of enabling [name] to come off of the ventilator and be able to speak to her family.  If Shannon [name] does not show improvement or begins to further decline then plan will be made for transition to comfort measures.   Review of Systems  Unable to perform ROS: Intubated  Physical Exam Vitals and nursing note reviewed.  Constitutional:      Appearance: She is ill-appearing.  Pulmonary:     Comments: Intubated Neurological:     Comments: sedated             Vital Signs: BP (!) 91/50   Pulse 88   Temp 97.7 F (36.5 C) (Oral)   Resp (!) 22   Ht 5' (1.524 m)   Wt 69.1 kg   SpO2 99%   BMI 29.75 kg/m  SpO2: SpO2: 99 % O2 Device: O2 Device: Ventilator O2 Flow Rate: O2 Flow Rate (L/min): 35 L/min  Intake/output summary:  Intake/Output Summary (Last 24 hours) at 12/20/2021 1114 Last data filed at 12-20-2021 0800 Gross per 24 hour  Intake 2817.15 ml  Output 5141 ml  Net -2323.85 ml   LBM: Last BM Date : 11/26/21 Baseline Weight: Weight: 89.4 kg Most recent weight: Weight: 69.1 kg       Palliative Assessment/Data: PPS: 10%      Patient Active Problem List   Diagnosis Date Noted   Pressure injury of skin 11/23/2021   Acute respiratory failure with hypoxia ([place]) 10/31/2021   AKI (acute kidney injury) ([place])    Hyperkalemia    Community acquired pneumonia of left lower lobe of lung    Pulmonary hypertension ([place]) 07/10/2021   Mixed hyperlipidemia 06/08/2020   DOE (dyspnea on exertion) 03/25/2016   Abnormal chest CT 03/25/2016   Type 2 diabetes mellitus with complication, without long-term current use of  insulin ([place]) 03/25/2016   Essential hypertension 12/18/2015   Inflammatory myopathy 01/03/2013    Palliative Care Assessment & Plan    Assessment/Recommendations/Plan  DNR Continue supportive care for 24 hrs  If patient does not improve or shows decline plan for transition to comfort measures Chaplain consult for prayer   Code Status: DNR  Prognosis:  Hours - Days  Discharge Planning: Anticipated Hospital Death  Care plan was discussed with patient and care team  Thank you for allowing the Palliative Medicine Team to assist in the care of this patient.   Greater than 50%  of this time was spent counseling and coordinating care related to the above assessment and plan.  Shannon [name], AGNP-C Palliative Medicine   Please contact Palliative Medicine Team phone at [phone] for questions and concerns.

## 2021-12-25 NOTE — Progress Notes (Signed)
NO started at 0853 at 20ppm per PCCM.

## 2021-12-25 NOTE — Progress Notes (Signed)
Palliative-  Noted patient BP decreasing despite max interventions.  Discussed with family. She is actively dying.  Decision made to extubate. Pressors stopped. CRRT disconnected. Patient died quickly and peacefully. Family at bedside.   Mariana Kaufman, AGNP-C Palliative Medicine  Total additional time: 60 minutes

## 2021-12-25 NOTE — Progress Notes (Signed)
NAME:  Shannon [name], MRN:  [id number], DOB:  [birth year], LOS: 73 ADMISSION DATE:  11/16/2021, CONSULTATION DATE:  7/28 REFERRING MD:  Dr. Regenia Skeeter, CHIEF COMPLAINT:  chf exacerbation   History of Present Illness:  Patient is a 56 year old female with pertinent PMH HTN, DMT2, severe pulmonary HTN followed by [name] presents to [facility] ED on 7/28 with SOB.  Patient has been experiencing weakness for the past several days and having trouble walking without getting SOB.  Today on 7/28 patient was more confused per family member.  Also was having trouble breathing.  Patient wears 2L [place] at home.  EMS called and transported patient to [facility] ED.  Patient sats low in the 80s.  Upon arrival to [facility] ED on 7/28, patient sats improved on 6L [place].  Hemodynamically stable.  Patient Aox4. CXR showing LLL atelectasis. CT chest showing LLL consolidation. Azithromycin/rocephin given. Troponin wnl. BNP 1,014. ABG initially 7.34, 45, 63, 25. Covid/flu negative. Na 132, K 7.5, creat 3.3, bun 51, ammonia 43. Patient began to have worsening resp distress and was placed on bipap. Given calcium, insulin/dextrose, albuterol, lasix. Patient mental status worsening on bipap. Repeat abg 7.25, 58, 71, 25. Patient arousable but remains lethargic. PCCM consulted.  Pertinent  Medical History   Past Medical History:  Diagnosis Date   CHF (congestive heart failure) ([place])    Diabetes ([place])    Hypertension    Inflammatory myopathy    treated 2012   Pulmonary hypertension ([place])    Wears glasses    reading    Significant Hospital Events: Including procedures, antibiotic start and stop dates in addition to other pertinent events   7/28 admitted [facility] resp failure on bipap  Interim History / Subjective:  No events. Remains on CRRT. Weak, dyspneic with minimal exertion. Had  choking episode this am drinking water.  Objective   Blood pressure (!) 91/50, pulse 88, temperature (!) 97.5 F (36.4 C), temperature source Axillary,  resp. rate (!) 22, height 5' (1.524 m), weight 69.1 kg, SpO2 100 %. 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 Recurrent aspiration, abx added yesterday, will add vasopressin and iNO as last ditch effort otherwise need to consider EOL discussions; palliative following - BIPAP qHS, [place] during day - f/u CXR - Agree with palliative consult - Will follow  Best Practice (right click and "Reselect all SmartList Selections" daily)  Diet/type: Regular consistency DVT prophylaxis: bival GI prophylaxis: N/A Lines: N/A Foley:  N/A Code Status:  full code Last date of multidisciplinary goals of care discussion [ongoing daily]   Patient critically ill due to shock Interventions to address this today levophed titration Risk of deterioration without these interventions is high  I personally spent 38 minutes providing critical care not including any separately billable  procedures  Erskine Emery MD [facility]  Prefer epic messenger for cross cover needs If after hours, please call E-link

## 2021-12-25 NOTE — Progress Notes (Signed)
Worsened oxygenation after iNO initiation question unmasking of concurrent PVOD.  Intubated after discussion with family.  Now on multiple high dose pressors.  Family agrees to DNR. If no improvement in a few hours would recommend transition to comfort.  Erskine Emery MD PCCM

## 2021-12-25 NOTE — Progress Notes (Signed)
Patient became increasingly very dyspneic after being off of CRRT (due to access being TPAed) and could not catch her breath...RT placed patient on Bipap with no improvement and a decrease in mentation. RN asked patient if she wanted a breathing tube and she said "Yes." Patient intubated at bedside by CCM with pressors on board, 2 amps of bicarb, etomidate, and paralytic.   No improvement in O2 sats and ventilation per ABG on max vent settings. Remains on 60 mg levo, 5 mcg epi, 0.04 vaso, and 7.5 dob and blood pressures still continue to drop.    Patient DNR per family.

## 2021-12-25 NOTE — Progress Notes (Addendum)
PCCM INTERVAL PROGRESS NOTE   Called to bedside to evaluate patient for respiratory distress. Had been off CRRT for only a brief time and became markedly dyspneic with O2 sats dropping into the 70s. BiPAP was started without much improvement in sats. She became unresponsive. Decision was made to proceed with emergent intubation.   Acute hypoxemic respiratory failure secondary to end stage RV failure, volume overload, cardiogenic shock  - Emergent intubation - RASS goal -1 to -2.  - Fentanyl infusion and PRN versed.  - CXR for tube placement.  - VAP bundle - ABG  Addendum 1143:  ABG reviewed. Respiratory acidosis.  Po2 55 acceptable but not ideal Increase RR. Repeat ABG 1 hour   Georgann Housekeeper, AGACNP-BC [facility]  See Amion for personal pager PCCM on call pager [phone] until 7pm. Please call Elink 7p-7a. [phone]  2021-12-22 10:46 AM

## 2021-12-25 NOTE — Progress Notes (Signed)
Pt's WOB increased and SATs sustaining 70%-77% on 100% 35L and 20ppm NO. NRB added, no difference made with SATs. PCCM notified, orders given to wean off of NO and place on bipap. NO weaned off and placed on bipap. Pt's mental status worsened within minutes, SATs increased to 92%. PCCM notified of mental status change. Order's given to intubate.

## 2021-12-25 NOTE — Progress Notes (Signed)
Patient ID: Shannon [name], female   DOB: 26-May-1965, 56 y.o.   MRN: [id number] S: Currently off of CRRT due to poorly functioning catheter.   Activase dwelling and then will resume O:BP (!) 91/50   Pulse 88   Temp (!) 97.5 F (36.4 C) (Axillary)   Resp (!) 22   Ht 5' (1.524 m)   Wt 69.1 kg   SpO2 100% Comment: Briefly shown 100%, RN addressing pulseox.  BMI 29.75 kg/m   Intake/Output Summary (Last 24 hours) at 12/02/21 1006 Last data filed at Dec 02, 2021 0700 Gross per 24 hour  Intake 2863.55 ml  Output 5222 ml  Net -2358.45 ml   Intake/Output: I/O last 3 completed shifts: In: 4834.2 [P.O.:2439; I.V.:1487.1; Other:100; IV Piggyback:808.1] Out: 8471 [Urine:90]  Intake/Output this shift:  No intake/output data recorded. Weight change: -13.7 kg Gen: chronically ill-appearing and in mild respiratory distress CVS:RRR Resp: occ rhonchi bilaterally Abd:+BS, soft, NT/ND Ext: 1+ pitting edema BLE, wraps in place.  Recent Labs  Lab 11/28/21 0447 11/28/21 2039 11/29/21 0351 11/29/21 1533 11/30/21 0412 11/30/21 0838 11/30/21 1710 11/30/21 1723 11/30/21 1843 12/02/2021 0417  NA 130* 130* 130* 132* 132*  --  131* 132* 133* 130*  K 4.3 4.1 4.2 4.4 4.0  --  4.3 4.3 4.3 3.9  CL 98 98 97* 99 99  --  99  --   --  99  CO2 _0 --  22  --   --  23  GLUCOSE 142* 110* 166* 128* 139*  --  121*  --   --  105*  BUN 6 6 5* 6 7  --  7  --   --  9  CREATININE 0.86 0.71 0.70 0.72 0.82  --  0.78  --   --  0.83  ALBUMIN 2.9* 3.2* 3.0* 3.2* 3.1* 3.1* 3.2*  --   --  3.1*  CALCIUM 7.9* 8.0* 8.1* 8.6* 8.6*  --  8.5*  --   --  8.7*  PHOS 1.4* 2.9  2.8 2.2* 1.8* 3.4  --  2.3*  --   --  2.3*  AST  --   --   --   --   --  27  --   --   --   --   ALT  --   --   --   --   --  22  --   --   --   --    Liver Function Tests: Recent Labs  Lab 11/30/21 0838 11/30/21 1710 2021/12/02 0417  AST 27  --   --   ALT 22  --   --   ALKPHOS 118  --   --   BILITOT 2.8*  --   --   PROT 7.6  --   --    ALBUMIN 3.1* 3.2* 3.1*   No results for input(s): "LIPASE", "AMYLASE" in the last 168 hours. No results for input(s): "AMMONIA" in the last 168 hours. CBC: Recent Labs  Lab 11/27/21 0411 11/28/21 0447 11/29/21 0351 11/30/21 0412 11/30/21 0838 11/30/21 1723 11/30/21 1843 2021-12-02 0417  WBC 7.7 10.0 19.9* 11.9*  12.1*  --   --   --  10.1  NEUTROABS  --   --   --  8.4*  --   --   --   --   HGB 9.9* 9.2* 8.8* 8.8*  8.9*  --  11.2* 11.6* 8.4*  HCT 28.5* 26.5* 26.2* 25.7*  25.7*  --  33.0* 34.0* 24.8*  MCV 81.4 82.0 84.0 82.1  81.6  --   --   --  83.5  PLT 118* 115* 98* 71*  68* 70*  --   --  76*   Cardiac Enzymes: No results for input(s): "CKTOTAL", "CKMB", "CKMBINDEX", "TROPONINI" in the last 168 hours. CBG: Recent Labs  Lab 11/30/21 1617 11/30/21 1937 11/30/21 2333 12-07-2021 0420 12/07/21 0751  GLUCAP 104* 142* 152* 63* 85    Iron Studies: No results for input(s): "IRON", "TIBC", "TRANSFERRIN", "FERRITIN" in the last 72 hours. Studies/Results: DG CHEST PORT 1 VIEW  Result Date: 2021/12/07 CLINICAL DATA:  Shortness of breath EXAM: PORTABLE CHEST 1 VIEW COMPARISON:  Chest x-ray dated November 29, 2021 FINDINGS: Unchanged position of right IJ line and subclavian line. Cardiac and mediastinal contours are unchanged. Low lung volumes. Mild bibasilar opacities, decreased when compared to prior exam. No large pleural effusion or evidence of pneumothorax. IMPRESSION: Decreased bilateral airspace opacities, likely due to improved pulmonary edema or atelectasis. Electronically Signed   By: Yetta Glassman M.D.   On: 12/07/2021 08:16   DG CHEST PORT 1 VIEW  Result Date: 11/29/2021 CLINICAL DATA:  Respiratory distress.  Pneumonia. EXAM: PORTABLE CHEST 1 VIEW COMPARISON:  Radiographs 11/26/2021 and 11/23/2021.  CT 11/22/2021. FINDINGS: 1028 hours. Enteric tube has been removed. Left subclavian central venous catheter projects to the level of the mid right atrium, unchanged. Interval  exchange of the right IJ central venous catheter, projecting to the level of the superior cavoatrial junction. The heart size and mediastinal contours are stable. There are lower lung volumes with increasing bibasilar pulmonary opacities and poor definition of the pulmonary vasculature. There are possible small bilateral pleural effusions. No evidence of pneumothorax. No acute osseous findings are evident. Telemetry leads overlie the chest. IMPRESSION: 1. Increasing bibasilar pulmonary opacities with probable pulmonary edema and small pleural effusions. 2. Support system positioned as above.  No pneumothorax. Electronically Signed   By: Richardean Sale M.D.   On: 11/29/2021 12:04    (feeding supplement) PROSource Plus  30 mL Oral BID BM   Chlorhexidine Gluconate Cloth  6 each Topical Daily   famotidine  20 mg Oral Daily   feeding supplement (NEPRO CARB STEADY)  237 mL Oral Q2000   Gerhardt's butt cream   Topical TID   macitentan  10 mg Oral Daily   midodrine  5 mg Oral TID WC   multivitamin  1 tablet Oral QHS   mouth rinse  15 mL Mouth Rinse 4 times per day   phosphorus  500 mg Oral Once   polyethylene glycol  17 g Oral Daily   senna-docusate  1 tablet Oral BID   sodium chloride flush  3 mL Intravenous Q12H    BMET    Component Value Date/Time   NA 130 (L) 07-Dec-2021 0417   NA 145 (H) 03/05/2021 1529   K 3.9 2021/12/07 0417   CL 99 07-Dec-2021 0417   CO2 23 12/07/21 0417   GLUCOSE 105 (H) 2021-12-07 0417   BUN 9 2021-12-07 0417   BUN 19 03/05/2021 1529   CREATININE 0.83 12/07/2021 0417   CREATININE 0.96 06/17/2021 1055   CALCIUM 8.7 (L) 12-07-2021 0417   GFRNONAA >60 07-Dec-2021 0417   GFRAA 116 06/03/2020 1606   CBC    Component Value Date/Time   WBC 10.1 2021/12/07 0417   RBC 2.97 (L) 2021-12-07 0417   HGB 8.4 (L) Dec 07, 2021 0417   HGB 14.2 03/05/2021 1529   HCT 24.8 (  L) 2021-12-28 0417   HCT 42.5 03/05/2021 1529   PLT 76 (L) 12-28-2021 0417   PLT 408 03/05/2021 1529    MCV 83.5 12-28-2021 0417   MCV 90 03/05/2021 1529   MCH 28.3 December 28, 2021 0417   MCHC 33.9 12/28/2021 0417   RDW 19.2 (H) 2021/12/28 0417   RDW 14.8 03/05/2021 1529   LYMPHSABS 0.9 11/30/2021 0412   LYMPHSABS 5.6 (H) 07/13/2017 1531   MONOABS 2.1 (H) 11/30/2021 0412   EOSABS 0.3 11/30/2021 0412   EOSABS 0.1 07/13/2017 1531   BASOSABS 0.0 11/30/2021 0412   BASOSABS 0.0 07/13/2017 1531    Assessment/Plan:   AKI/CKD stage IIIb - due to cardiorenal syndrome in setting of severe pulmonary HTN and right heart failure.  Started on CRRT 11/23/21 due to hypotension and worsening volume overload.  She remains hypotensive and on pressors.  Able to UF with CRRT but she is not a candidate for iHD given her severe pulmonary HTN and RV failure.  CRRT prescription:  4K/2.5Ca for all fluids, pre-filter rate 500 mL/hr, post-filter rate 300 mL/hr, dialysate 1,500 mL/hr, no anticoagulation, UF goal 50-150 mL/hr Temp HD catheter placed 11/23/21 and may need to be replaced soon if we will continue with CRRT. 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  CVP remains elevated at 29 despite UF of  2 liters overnight and 20 liters since starting CRRT.  On high dose levophed and dobutamine.  Continue with midodrine 15 mg tid and opsumit 10 mg daily.  Transitioned to sildenafil 20 mg tid per heart failure team. Acute on chronic hypoxemic respiratory failure - continue with supplemental oxygen and BiPap qhs. Anemia of critical illness - transfuse for Hgb <7 Hyperkalemia - resolved with CRRT Hypophosphatemia - recently repleted.  Disposition - poor overall prognosis and palliative care to discuss EOL/goals of care with family today.   Donetta Potts, MD [facility]

## 2021-12-25 NOTE — Progress Notes (Signed)
Pt wanted to be taken off bipap, Pt placed back on HHFNC and no distress at the moment.

## 2021-12-25 NOTE — Procedures (Signed)
Intubation Procedure Note  Shannon [name]  [id number]  [birth year]  Date:12-09-21  Time:10:45 AM   Provider Performing:Ariatna Jester W Heber Honor    Procedure: Intubation (58251)  Indication(s) Respiratory Failure  Consent Risks of the procedure as well as the alternatives and risks of each were explained to the patient and/or caregiver.  Consent for the procedure was obtained and is signed in the bedside chart   Anesthesia Etomidate, Rocuronium   Time Out Verified patient identification, verified procedure, site/side was marked, verified correct patient position, special equipment/implants available, medications/allergies/relevant history reviewed, required imaging and test results available.   Sterile Technique Usual hand hygeine, masks, and gloves were used   Procedure Description Patient positioned in bed supine.  Sedation given as noted above.  Patient was intubated with endotracheal tube using Glidescope.  View was Grade 1 full glottis .  Number of attempts was 1.  Colorimetric CO2 detector was consistent with tracheal placement. Vomited during intubation.    Complications/Tolerance None; patient tolerated the procedure well. Chest X-ray is ordered to verify placement.   EBL NOne   Specimen(s) None   Georgann Housekeeper, AGACNP-BC [facility]  See Amion for personal pager PCCM on call pager [phone] until 7pm. Please call Elink 7p-7a. [phone]  12-09-21 10:46 AM

## 2021-12-25 NOTE — Progress Notes (Signed)
Nutrition Brief Note  Chart reviewed. Pt now transitioning to comfort care, one way extubation.  No further nutrition interventions planned at this time.  Please re-consult as needed.   Kerman Passey MS, RDN, LDN, CNSC Registered Dietitian 3 Clinical Nutrition RD Pager and On-Call Pager Number Located in [place]

## 2021-12-25 NOTE — Progress Notes (Signed)
Patient terminally extubated and all vasoactive meds turned off per Palliative Care NP.   Patient bolused with 100 mcg of fentanyl to maintain comfort and fentanyl gtt remains on at 50 mcg

## 2021-12-25 NOTE — Procedures (Signed)
Extubation Procedure Note  Patient Details:   Name: Shannon [name] DOB: 1966/04/25 MRN: [id number]   Airway Documentation:  Airway 7.5 mm (Active)  Secured at (cm) 24 cm Dec 13, 2021 1045  Measured From Lips 2021/12/13 1045  Secured Location Right 12/13/2021 1045  Secured By [employer] 2021/12/13 1045  Cuff Pressure (cm H2O) Clear OR 27-39 CmH2O 12/13/21 1045  Site Condition Dry 12-13-21 1045   Vent end date: (not recorded) Vent end time: (not recorded)   Evaluation  O2 sats: stable throughout Complications: No apparent complications Patient did tolerate procedure well. Bilateral Breath Sounds: Rhonchi, Rales   Yes One-extubation   Felecia Jan 12-13-2021, 1:31 PM

## 2021-12-25 NOTE — Progress Notes (Signed)
Pt placed on bipap while sleeping and is tolerating it well at this time.

## 2021-12-25 NOTE — Progress Notes (Addendum)
This chaplain responded to PMT NP-Kasie consult for Pt. prayer. Upon arriving on the unit, the chaplain learned of the Pt. death from the Pt. RN-Katelyn.    The chaplain introduced herself to the Pt. family: Pt. mother-Mary and sister-Cathy. The chaplain understands other family members are planning to visit. The chaplain offered prayer and support of the family. The family declined prayer because of a family ministry presence. The family asked for assistance informing the Pt. incarcerated brother-Richard Venell Hecker of the Pt. death. The chaplain attempted a "chaplain to chaplain" phone call to [facility]. The chaplain left a message for a returned call with Lt. Rouse. The chaplain understands from [place]. Rouse the standard protocol is the jail chaplain will inform the inmate.    The chaplain updated the Pt. mother-Mary on her progress with [facility] and the chaplain's plan to update [place] before 5pm today.  **[id number] This chaplain phoned Pt. mother-Mary Wheless. The chaplain understands the Pt. incarcerated brother-Richard phoned the family.  Chaplain Sallyanne Kuster [phone]

## 2021-12-25 NOTE — Progress Notes (Addendum)
Patient ID: Shannon [name], female   DOB: [birth year], 56 y.o.   MRN: [id number] P    Advanced Heart Failure Rounding Note  PCP-Cardiologist: Fransico Him, MD   Subjective:    7/31: started on CRRT 8/1: NE added for BP support to facilitate CRRT volume removal  8/22: Co-ox 41% (confirmed x 2), DBA increased to 5.  constipation, N/V + abd distention. KUB unremarkable. NGT placed. Reglan started  8/7 Palliative Care consulted.   Continues on 5 DBA.  NE now up to 40 to maintain MAP for CVVH. CO-OX down to 47%.    CVP 26 still. Continues to pull UF 150 cc/hr consistently via CVVH. SBP ranging 70s-90s.  Standing weight down significantly ?   Bipap at night, HFNC 25 liter/min.    Plts up to 76. On bivalirudin, HIT sent.    Coughing when drinking water.   Echo: EF 60-65%, D-shaped septum, moderate RV enlargement/moderate RV dysfunction, PASP 59, mod-severe TR, IVC dilated.    Objective:   Weight Range: 69.1 kg Body mass index is 29.75 kg/m.   Vital Signs:   Temp:  [97.5 F (36.4 C)] 97.5 F (36.4 C) (08/07 2000) Pulse Rate:  [42-96] 86 (08/08 0600) Resp:  [16-27] 27 (08/08 0600) BP: (81-91)/(50-62) 91/50 (08/07 1149) SpO2:  [93 %-100 %] 97 % (08/08 0348) Arterial Line BP: (81-119)/(44-64) 103/61 (08/08 0600) FiO2 (%):  [60 %-80 %] 80 % (08/08 0348) Weight:  [69.1 kg] 69.1 kg (08/08 0500) Last BM Date : 11/26/21  Weight change: Filed Weights   11/29/21 0500 11/30/21 0500 12/11/21 0500  Weight: 85.7 kg 82.8 kg 69.1 kg    Intake/Output:   Intake/Output Summary (Last 24 hours) at 12/11/2021 0727 Last data filed at 12/11/2021 0600 Gross per 24 hour  Intake 3155.26 ml  Output 6147 ml  Net -2991.74 ml      Physical Exam  CVP 26 General:   Appears weak. Coughing. Tearful.  HEENT: normal Neck: supple. JVP to jaw   Carotids 2+ bilat; no bruits. No lymphadenopathy or thryomegaly appreciated. Cor: PMI nondisplaced. Regular rate & rhythm. No rubs, gallops or  murmurs. Lungs: clear on 25 liter HFNC Abdomen: soft, nontender, nondistended. No hepatosplenomegaly. No bruits or masses. Good bowel sounds. Extremities: no cyanosis, clubbing, rash, edema Neuro: alert & orientedx3, cranial nerves grossly intact. moves all 4 extremities w/o difficulty.   Telemetry   SR 80s  Labs    CBC Recent Labs    11/30/21 0412 11/30/21 0838 11/30/21 1723 11/30/21 1843 12/11/2021 0417  WBC 11.9*  12.1*  --   --   --  10.1  NEUTROABS 8.4*  --   --   --   --   HGB 8.8*  8.9*  --    < > 11.6* 8.4*  HCT 25.7*  25.7*  --    < > 34.0* 24.8*  MCV 82.1  81.6  --   --   --  83.5  PLT 71*  68* 70*  --   --  76*   < > = values in this interval not displayed.   Basic Metabolic Panel Recent Labs    11/30/21 0412 11/30/21 1710 11/30/21 1723 11/30/21 1843 2021/12/11 0417  NA 132* 131*   < > 133* 130*  K 4.0 4.3   < > 4.3 3.9  CL 99 99  --   --  99  CO2 26 22  --   --  23  GLUCOSE 139* 121*  --   --  105*  BUN 7 7  --   --  9  CREATININE 0.82 0.78  --   --  0.83  CALCIUM 8.6* 8.5*  --   --  8.7*  MG 2.4  --   --   --  2.3  PHOS 3.4 2.3*  --   --  2.3*   < > = values in this interval not displayed.   Liver Function Tests Recent Labs    11/30/21 0838 11/30/21 1710 12/29/2021 0417  AST 27  --   --   ALT 22  --   --   ALKPHOS 118  --   --   BILITOT 2.8*  --   --   PROT 7.6  --   --   ALBUMIN 3.1* 3.2* 3.1*   No results for input(s): "LIPASE", "AMYLASE" in the last 72 hours. Cardiac Enzymes No results for input(s): "CKTOTAL", "CKMB", "CKMBINDEX", "TROPONINI" in the last 72 hours.   BNP: BNP (last 3 results) Recent Labs    09/10/21 1614 11/11/21 1620 11/19/2021 1510  BNP 916.7* 1,063.1* 1,014.0*    ProBNP (last 3 results) No results for input(s): "PROBNP" in the last 8760 hours.   D-Dimer Recent Labs    11/30/21 0838  DDIMER 2.72*   Hemoglobin A1C No results for input(s): "HGBA1C" in the last 72 hours.  Fasting Lipid Panel No  results for input(s): "CHOL", "HDL", "LDLCALC", "TRIG", "CHOLHDL", "LDLDIRECT" in the last 72 hours. Thyroid Function Tests No results for input(s): "TSH", "T4TOTAL", "T3FREE", "THYROIDAB" in the last 72 hours.  Invalid input(s): "FREET3"  Other results:   Imaging    No results found.   Medications:     Scheduled Medications:  (feeding supplement) PROSource Plus  30 mL Oral BID BM   Chlorhexidine Gluconate Cloth  6 each Topical Daily   famotidine  20 mg Oral Daily   feeding supplement (NEPRO CARB STEADY)  237 mL Oral Q2000   Gerhardt's butt cream   Topical TID   insulin aspart  0-6 Units Subcutaneous Q4H   macitentan  10 mg Oral Daily   midodrine  15 mg Oral TID WC   multivitamin  1 tablet Oral QHS   mouth rinse  15 mL Mouth Rinse 4 times per day   polyethylene glycol  17 g Oral Daily   senna-docusate  1 tablet Oral BID   sodium chloride flush  3 mL Intravenous Q12H    Infusions:   prismasol BGK 4/2.5 500 mL/hr at 12-29-2021 0114    prismasol BGK 4/2.5 300 mL/hr at 12/29/21 0433   sodium chloride 10 mL/hr at 11/29/21 1847   sodium chloride     sodium chloride Stopped (11/30/21 1457)   bivalirudin (ANGIOMAX) 250 mg in sodium chloride 0.9 % 500 mL (0.5 mg/mL) infusion 0.03 mg/kg/hr (12/29/2021 0600)   ceFEPime (MAXIPIME) IV Stopped (11/30/21 2142)   DOBUTamine 5 mcg/kg/min (29-Dec-2021 0600)   norepinephrine (LEVOPHED) Adult infusion 40 mcg/min (2021-12-29 0609)   prismasol BGK 4/2.5 1,500 mL/hr at Dec 29, 2021 0432   vancomycin      PRN Medications: Place/Maintain arterial line **AND** sodium chloride, sodium chloride, Place/Maintain arterial line **AND** sodium chloride, bisacodyl, docusate, ondansetron (ZOFRAN) IV, mouth rinse, polyethylene glycol, simethicone, sodium chloride, sodium chloride flush, white petrolatum    Assessment/Plan   1. Pulmonary hypertension/RV failure: Echo in 11/22 showed EF 60-65%, mild LVH, severe RV enlargement with severely decreased RV systolic  function, PASP 66 mmHg, severe RAE, moderate TR, moderate PR, small pericardial effusion. CTA chest  showed no PE, normal lung fields and V/Q scan showed no evidence for chronic PE.  PFTs showed severe restriction and decreased DLCO.  RHC/LHC showed severe pulmonary arterial HTN with RV failure.  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  No history of liver disease.  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  ANA, SCL-70, anti-centromere antibody, and RF negative with elevated CRP. She may have connective tissue disease-related PAH though she saw rheumatology and no definite diagnosis was given.  High resolution CT chest showed no evidence for ILD.  She was admitted this time with worsening RV failure and AKI. Echo this admission with EF 60-65%, D-shaped septum, moderate RV enlargement/moderate RV dysfunction, PASP 59, mod-severe TR, IVC dilated. CVP is still 29 this morning, marked volume overload on exam.  She was started on dobutamine 2.5 for RV support, ultimately further titrated to 5 after Co-ox dropped to 40%. Oliguric despite high dose lasix gtt at 30/hr. Started on CRRT 7/31 for intractable volume overload. This morning on DBA 5 + 40 NE. Co-ox 47%.  Pulling UF net negative 150 cc/hr consistently via CVVH. CVP 26.  - On high dose of NE to allow CVVH, had arterial line.   - Continue DBA + NE. CO-OX 47% => increase dobutamine to 7.5.   - Continue to decongest RV with markedly elevated CVP, tolerating CVVH net UF 150 cc/hr but requiring high dose NE.  - Continue midodrine 15 tid.  - Continue Opsumit 10 mg daily.  - Sildenafil stopped with hypotension.  - Adding iNO today.  - Prognosis increasingly poor, we still have a lot of fluid to remove and she is now requiring high pressor dose to allow CVVH. Not good long-term HD candidate.  Palliative Care following.   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 She does not report myalgias or muscle pain.  Myositis panel was negative.  She saw rheumatology and no definite diagnosis was given. CK normal at 62 this admission.  3. Acute on chronic hypoxemic respiratory failure: On 2L home oxygen at baseline, currently on HFNC with Bipap at night.  Off antibiotics.  4. AKI on CKD stage 3: Creatinine had been slowly rising at home with diuresis, suspect cardiorenal syndrome.  However, up to 3.3 at admission with marked hyperkalemia. No arrhythmias. K now normal. Anuric.  - Continue trial of CVVH to see if we can decongest her then titrate off the CVVH. Would tolerate iHD poorly with severe RV failure. - Nephrology following. Appreciate their assistance  5. ID: CT chest with LLL infiltrate => completed Ceftriaxone/azithromycin. However, now concerned for aspiration and CXR worsening.  - Obtain speech consult.   - Continue empiric vancomycin and cefepime.  6. Thrombocytopenia: Plts stable 76K today.  Off heparin and on bivalirudin.  - Continue bivalirudin.  - Pending HIT.  7. GOC  Palliative Care following for GOC. Concerned we are running out of options.  Dr Tamala Julian and Dr [name] met with her and discussed. Palliative Care meeting with her today.   Length of Stay: Wrens, NP  12/31/2021, 7:27 AM  Advanced Heart Failure Team Pager [phone] (M-F; 7a - 5p)  Please contact [facility] for night-coverage after hours (5p -7a ) and weekends on [email or web address]  Patient seen with NP, agree with the above note.   Pulling 150 cc/hr net UF but now requiring NE 40 with dobutamine 5.  Weight down but CVP still 26. Co-ox lower at 47% today.  Patient has been coughing. She is empirically on vancomycin/cefepime.   General: NAD Neck: JVP 16 cm, no thyromegaly or thyroid nodule.  Lungs: Clear to auscultation  bilaterally with normal respiratory effort. CV: Nondisplaced PMI.  Heart regular S1/S2, no S3/S4, no murmur.  1+ ankle edema.  Abdomen: Soft, nontender, no hepatosplenomegaly, no distention.  Skin: Intact without lesions or rashes.  Neurologic: Alert and oriented x 3.  Psych: Normal affect. Extremities: No clubbing or cyanosis.  HEENT: Normal.   Still marked volume overload with CVP 26. Pulling fluid steadily via CVVH but requiring NE up to 40.  Dobutamine at 5 with co-ox 47%.   - Increase dobutamine to 7.5.  - Will try addition of iNO to see if this improves hemodynamics.  - Continue CVVH today at current rate.  - Long-term prognosis is poor.  I suspect that she will not have renal recovery, and she is requiring very high pressor dose just for CVVH due to advanced RV failure.  She is not a candidate for iHD.  If hemodynamics do not improve significantly with iNO, I think we will need to begin to consider comfort measures.  Dr. Tamala Julian and I discussed this with her today. Would like to continue aggressive care today, there is a chance that with NO and pulling more fluid off her RV that her hemodynamics could improve.   Continue empiric vanc/cefepime for ?aspiration PNA and will have her seen by speech.   On bivalirudin for plt decline and ?HIT. Pending panel.   CRITICAL CARE Performed by: Loralie Champagne  Total critical care time: 40 minutes  Critical care time was exclusive of separately billable procedures and treating other patients.  Critical care was necessary to treat or prevent imminent or life-threatening deterioration.  Critical care was time spent personally by me on the following activities: development of treatment plan with patient and/or surrogate as well as nursing, discussions with consultants, evaluation of patient's response to treatment, examination of patient, obtaining history from patient or surrogate, ordering and performing treatments and interventions, ordering and  review of laboratory studies, ordering and review of radiographic studies, pulse oximetry and re-evaluation of patient's condition.  Loralie Champagne. 12-10-2021. 8:06 AM

## 2021-12-25 NOTE — Progress Notes (Signed)
Inpatient Diabetes Program Recommendations  AACE/ADA: New Consensus Statement on Inpatient Glycemic Control (2015)  Target Ranges:  Prepandial:   less than 140 mg/dL      Peak postprandial:   less than 180 mg/dL (1-2 hours)      Critically ill patients:  140 - 180 mg/dL   Lab Results  Component Value Date   GLUCAP 85 2021-12-03   HGBA1C 5.5 11/14/2021    Review of Glycemic Control  Latest Reference Range & Units 11/30/21 11:03 11/30/21 16:17 11/30/21 19:37 11/30/21 23:33 2021/12/03 04:20 12-03-21 07:51  Glucose-Capillary 70 - 99 mg/dL 107 (H) 104 (H) 142 (H) 152 (H) 63 (L) 85   Diabetes history: DM 2 Outpatient Diabetes medications:  Metformin 1000 mg bid Current orders for Inpatient glycemic control: Novolog 0-6 units q 4 hours  Inpatient Diabetes Program Recommendations:    Consider d/c of Novolog correction.   Thanks,  Adah Perl, RN, BC-ADM Inpatient Diabetes Coordinator Pager [phone]  (8a-5p)

## 2021-12-25 NOTE — Death Summary Note (Signed)
  Advanced Heart Failure Death Summary  Death Summary   Patient ID: Shannon [name] MRN: [id number], DOB/AGE: [birth year] 57 y.o. Admit date: 11/19/2021 D/C date:     12/03/2021   Primary Discharge Diagnoses:  RV failure  Pulmonary hypertension Inflammatory myopathy Acute on chronic hypoxemic respiratory failure AKI on CKD III Thrombocytopenia  Hospital Course:   56 y.o. with history of pulmonary HTN/RV failure, HTN, DM, inflammatory myopathy.    She has had a chronic mild elevation in CK level and was treated "years ago" with prednisone for an inflammatory myopathy.    Echo 11/22 showed EF 60-65%, mild LVH, severe RV enlargement with severely decreased RV systolic function, PASP 66 mmHg, severe RAE, moderate TR, moderate PR, small pericardial effusion.  CTA chest showed no PE, no significant lung abnormalities.  V/Q scan showed no chronic PE. LHC/RHC in 1/23 that showed normal coronaries, elevated right heart filling pressures, severe pulmonary arterial hypertension, and normal PCWP.    She was seen by rheumatology for "inflammatory myopathy."  Serologic workup was unremarkable. She was not thought to have active rheumatologic disease.    She was started on macitentan and tadalafil for pulmonary hypertension, and diuretics had increased for volume overload/RV failure.     Admitted on 11/03/2021 with AKI on CKD, hyperkalemia and marked volume overload. Initially CT chest with LLL infiltration and completed course of antibiotics. She was started on dobutamine gtt and Lasix gtt for RV failure/volume overload. She remained oliguric with high dose lasix gtt and started on CRRT for volume overload. Ultimately required high dose NE to allow fluid removal with CRRT. Despite this, it became increasing difficult to decongest RV. Inhaled NO added. She developed progressive respiratory failure raising concern iNO unmasked concurrent PVOD. She was intubated.   After discussion with family she was made a  DNR. Subsequently required high doses of multiple pressors. Decision made to extubate, disconnect CRRT and stop pressors. She was transitioned to comfort care. Patient expired peacefully around 1:33 pm on 2021-12-16 with family present at bedside.   Significant Diagnostic Studies Echo 11/21/21: EF 60-65%, septum is flattened, RV moderately reduced, RVSP 58 mmHg, RA severely enlarged, moderate to severe TR   Consultations  Pulmonary/critical care medicine Nephrology  Palliative Care  Duration of Discharge Encounter: Greater than 35 minutes   Signed, Assata Juncaj N, PA-C 12/03/2021, 1:15 PM

## 2021-12-25 DEATH — deceased

## 2022-02-08 ENCOUNTER — Telehealth (HOSPITAL_COMMUNITY): Payer: Self-pay

## 2022-02-08 NOTE — Telephone Encounter (Signed)
Received a fax requesting medical records from [facility]. Records were successfully faxed to: [phone] ,which was the number provided.. Medical request form from patient's daughter will be scanned into patients chart.

## 2022-02-11 ENCOUNTER — Other Ambulatory Visit (HOSPITAL_COMMUNITY): Payer: Self-pay

## 2022-03-08 ENCOUNTER — Encounter: Payer: 59 | Admitting: Nurse Practitioner
